# Patient Record
Sex: Female | Born: 1955 | Race: White | Hispanic: No | Marital: Married | Smoking: Never smoker
Health system: Southern US, Community
[De-identification: ages and names within clinical notes are randomized; demographics above are authoritative.]

## PROBLEM LIST (undated history)

## (undated) DIAGNOSIS — C8590 Non-Hodgkin lymphoma, unspecified, unspecified site: Secondary | ICD-10-CM

## (undated) DIAGNOSIS — J45909 Unspecified asthma, uncomplicated: Secondary | ICD-10-CM

## (undated) DIAGNOSIS — K219 Gastro-esophageal reflux disease without esophagitis: Secondary | ICD-10-CM

## (undated) DIAGNOSIS — Z8489 Family history of other specified conditions: Secondary | ICD-10-CM

## (undated) DIAGNOSIS — Z789 Other specified health status: Secondary | ICD-10-CM

## (undated) HISTORY — PX: PORT A CATH INJECTION (ARMC HX): HXRAD1731

## (undated) HISTORY — PX: ROTATOR CUFF REPAIR: SHX139

## (undated) HISTORY — DX: Other specified health status: Z78.9

## (undated) HISTORY — PX: ABDOMINAL HYSTERECTOMY: SHX81

## (undated) HISTORY — DX: Unspecified asthma, uncomplicated: J45.909

## (undated) HISTORY — PX: BLADDER SURGERY: SHX569

---
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
## 2010-01-20 ENCOUNTER — Ambulatory Visit: Payer: Self-pay | Admitting: Unknown Physician Specialty

## 2011-10-30 LAB — HM PAP SMEAR: HM Pap smear: NORMAL

## 2011-10-30 LAB — HM MAMMOGRAPHY: HM Mammogram: NORMAL

## 2012-05-15 ENCOUNTER — Ambulatory Visit: Payer: Self-pay | Admitting: Obstetrics and Gynecology

## 2013-01-25 ENCOUNTER — Emergency Department: Payer: Self-pay | Admitting: Emergency Medicine

## 2013-01-25 LAB — COMPREHENSIVE METABOLIC PANEL
Bilirubin,Total: 0.6 mg/dL (ref 0.2–1.0)
Co2: 25 mmol/L (ref 21–32)
EGFR (African American): >60
Glucose: 95 mg/dL (ref 65–99)
Osmolality: 275 (ref 275–301)
Potassium: 3.9 mmol/L (ref 3.5–5.1)
SGOT(AST): 21 U/L (ref 15–37)
Sodium: 137 mmol/L (ref 136–145)
Total Protein: 7.9 g/dL (ref 6.4–8.2)

## 2013-01-25 LAB — CBC
HCT: 39.7 % (ref 35.0–47.0)
HGB: 13.6 g/dL (ref 12.0–16.0)
MCH: 29.6 pg (ref 26.0–34.0)
MCHC: 34.2 g/dL (ref 32.0–36.0)
Platelet: 285 10*3/uL (ref 150–440)
RDW: 13.8 % (ref 11.5–14.5)
WBC: 8.8 10*3/uL (ref 3.6–11.0)

## 2013-01-25 LAB — TROPONIN I
Troponin-I: <0.02 ng/mL
Troponin-I: <0.02 ng/mL

## 2014-02-25 DIAGNOSIS — IMO0002 Reserved for concepts with insufficient information to code with codable children: Secondary | ICD-10-CM | POA: Insufficient documentation

## 2014-04-20 DIAGNOSIS — M67919 Unspecified disorder of synovium and tendon, unspecified shoulder: Secondary | ICD-10-CM | POA: Insufficient documentation

## 2015-04-06 ENCOUNTER — Ambulatory Visit (INDEPENDENT_AMBULATORY_CARE_PROVIDER_SITE_OTHER): Payer: 59 | Admitting: Family Medicine

## 2015-04-06 ENCOUNTER — Encounter: Payer: Self-pay | Admitting: Family Medicine

## 2015-04-06 VITALS — BP 116/77 | HR 80 | Temp 98.1°F | Resp 16 | Ht 66.0 in | Wt 197.6 lb

## 2015-04-06 DIAGNOSIS — J4520 Mild intermittent asthma, uncomplicated: Secondary | ICD-10-CM

## 2015-04-06 DIAGNOSIS — J45909 Unspecified asthma, uncomplicated: Secondary | ICD-10-CM | POA: Insufficient documentation

## 2015-04-06 MED ORDER — ALBUTEROL SULFATE HFA 108 (90 BASE) MCG/ACT IN AERS: 2.0000 | INHALATION_SPRAY | Freq: Four times a day (QID) | RESPIRATORY_TRACT | Status: DC | PRN

## 2015-04-06 NOTE — Patient Instructions (Signed)
Please use inhaler as needed.    Please seek immediate medical attention if you develop shortness of breath not relieve by inhaler, chest pain/tightness, fever > 103 F or other concerning symptoms.

## 2015-04-06 NOTE — Progress Notes (Signed)
Subjective:    Patient ID: Andrea Lloyd, female    DOB: August 12, 1956, 59 y.o.   MRN: [id number]  HPI: Andrea Lloyd is a 59 y.o. female presenting on 04/06/2015 for Follow-up   HPI  Pt presents for refills of inhalers. Pt has airway hyperactivity and uses inhalers sparingly.She had asthma as a child and uses inhalers as an adult when she has a cold. Last visit was in 2013. Pt uses inhaler once daily  roughly every 3-4 days "when she feels rattle in the back of her throat."  Last inhaler has lasted her approximately 12 mos.  Denies nighttime awakening with shortness of breath or wheezing. Denies chest tightness of shortness of breath. Has never had spirometry.   Offered patient health maintenance labs (lipid panel and blood glucose check) and mammogram.  Pt has declined at this time.   Past Medical History  Diagnosis Date  . Asthma   . Gravida 4 para 4     No current outpatient prescriptions on file prior to visit.   No current facility-administered medications on file prior to visit.    Review of Systems  Constitutional: Negative for fever and chills.  HENT: Negative.   Respiratory: Negative for cough, chest tightness and wheezing.   Cardiovascular: Negative for chest pain and leg swelling.  Gastrointestinal: Negative for nausea, vomiting, abdominal pain, diarrhea and constipation.  Endocrine: Negative.  Negative for cold intolerance, heat intolerance, polydipsia, polyphagia and polyuria.  Genitourinary: Negative for dysuria and difficulty urinating.  Musculoskeletal: Negative.   Neurological: Negative for dizziness, light-headedness and numbness.  Psychiatric/Behavioral: Negative.    Per HPI unless specifically indicated above     Objective:    BP 116/77 mmHg  Pulse 80  Temp(Src) 98.1 F (36.7 C) (Oral)  Resp 16  Ht 5\' 6"  (1.676 m)  Wt 197 lb 9.6 oz (89.631 kg)  BMI 31.91 kg/m2  Wt Readings from Last 3 Encounters:  04/06/15 197 lb 9.6 oz (89.631 kg)   07/12/12 200 lb 9.6 oz (90.992 kg)    Physical Exam  Constitutional: She is oriented to person, place, and time. She appears well-developed and well-nourished.  HENT:  Head: Normocephalic and atraumatic.  Neck: Neck supple. Carotid bruit is not present.  Cardiovascular: Normal rate, regular rhythm and normal heart sounds.  Exam reveals no gallop and no friction rub.   No murmur heard. Pulmonary/Chest: Effort normal and breath sounds normal. No respiratory distress. She has no wheezes. She exhibits no tenderness.  Abdominal: Normal appearance.  Lymphadenopathy:    She has no cervical adenopathy.  Neurological: She is alert and oriented to person, place, and time.  Skin: Skin is warm and dry.   Results for orders placed or performed in visit on 04/06/15  HM MAMMOGRAPHY  Result Value Ref Range   HM Mammogram normal   HM PAP SMEAR  Result Value Ref Range   HM Pap smear normal       Assessment & Plan:   Problem List Items Addressed This Visit      Respiratory   Airway hyperreactivity - Primary    Renewed inhaler today. Continued use as needed.  Pt has declined spirometry or peak flow today. Follow-up 1 year.   Pt declined health maintenance labwork.       Relevant Medications   albuterol (PROVENTIL HFA;VENTOLIN HFA) 108 (90 BASE) MCG/ACT inhaler      Meds ordered this encounter  Medications  . DISCONTD: albuterol (PROVENTIL HFA;VENTOLIN HFA) 108 (90 BASE) MCG/ACT  inhaler    Sig: Inhale 2 puffs into the lungs every 6 (six) hours as needed for wheezing or shortness of breath.  Marland Kitchen albuterol (PROVENTIL HFA;VENTOLIN HFA) 108 (90 BASE) MCG/ACT inhaler    Sig: Inhale 2 puffs into the lungs every 6 (six) hours as needed for wheezing or shortness of breath.    Dispense:  1 Inhaler    Refill:  11    Order Specific Question:  Supervising Provider    Answer:  Arlis Porta [phone]      Follow up plan: Return in about 1 year (around 04/05/2016), or if symptoms worsen or  fail to improve.

## 2015-04-06 NOTE — Assessment & Plan Note (Addendum)
Renewed inhaler today. Continued use as needed.  Pt has declined spirometry or peak flow today. Follow-up 1 year.   Pt declined health maintenance labwork.

## 2015-06-02 DIAGNOSIS — J45901 Unspecified asthma with (acute) exacerbation: Secondary | ICD-10-CM | POA: Insufficient documentation

## 2016-12-31 ENCOUNTER — Ambulatory Visit
Admission: RE | Admit: 2016-12-31 | Discharge: 2016-12-31 | Disposition: A | Discharge status: Home / Self Care | Payer: BLUE CROSS/BLUE SHIELD | Source: Ambulatory Visit | Attending: Gastroenterology | Admitting: Gastroenterology

## 2016-12-31 ENCOUNTER — Other Ambulatory Visit: Payer: Self-pay | Admitting: Gastroenterology

## 2016-12-31 DIAGNOSIS — R590 Localized enlarged lymph nodes: Secondary | ICD-10-CM | POA: Diagnosis not present

## 2016-12-31 DIAGNOSIS — R1032 Left lower quadrant pain: Secondary | ICD-10-CM | POA: Insufficient documentation

## 2016-12-31 MED ORDER — IOPAMIDOL (ISOVUE-300) INJECTION 61%
100.0000 mL | Freq: Once | INTRAVENOUS | Status: AC | PRN
Start: 2016-12-31 — End: 2016-12-31
  Administered 2016-12-31: 100 mL via INTRAVENOUS

## 2017-01-02 ENCOUNTER — Other Ambulatory Visit: Payer: Self-pay | Admitting: Gastroenterology

## 2017-01-02 DIAGNOSIS — R1013 Epigastric pain: Secondary | ICD-10-CM

## 2017-01-02 DIAGNOSIS — R591 Generalized enlarged lymph nodes: Secondary | ICD-10-CM

## 2017-01-02 DIAGNOSIS — R599 Enlarged lymph nodes, unspecified: Secondary | ICD-10-CM

## 2017-01-03 ENCOUNTER — Other Ambulatory Visit: Payer: Self-pay | Admitting: *Deleted

## 2017-01-04 ENCOUNTER — Ambulatory Visit
Admission: RE | Admit: 2017-01-04 | Discharge: 2017-01-04 | Disposition: A | Discharge status: Home / Self Care | Payer: BLUE CROSS/BLUE SHIELD | Source: Ambulatory Visit | Attending: Gastroenterology | Admitting: Gastroenterology

## 2017-01-04 DIAGNOSIS — K8020 Calculus of gallbladder without cholecystitis without obstruction: Secondary | ICD-10-CM | POA: Diagnosis not present

## 2017-01-04 DIAGNOSIS — R1013 Epigastric pain: Secondary | ICD-10-CM | POA: Insufficient documentation

## 2017-01-08 ENCOUNTER — Other Ambulatory Visit: Payer: Self-pay | Admitting: *Deleted

## 2017-01-08 ENCOUNTER — Ambulatory Visit
Admission: RE | Admit: 2017-01-08 | Discharge: 2017-01-08 | Disposition: A | Discharge status: Home / Self Care | Payer: BLUE CROSS/BLUE SHIELD | Source: Ambulatory Visit | Attending: Gastroenterology | Admitting: Gastroenterology

## 2017-01-08 DIAGNOSIS — R599 Enlarged lymph nodes, unspecified: Secondary | ICD-10-CM

## 2017-01-08 DIAGNOSIS — R591 Generalized enlarged lymph nodes: Secondary | ICD-10-CM

## 2017-01-08 DIAGNOSIS — R590 Localized enlarged lymph nodes: Secondary | ICD-10-CM | POA: Diagnosis present

## 2017-01-08 LAB — GLUCOSE, CAPILLARY: Glucose-Capillary: 91 mg/dL (ref 65–99)

## 2017-01-08 MED ORDER — FLUDEOXYGLUCOSE F - 18 (FDG) INJECTION
12.0000 | Freq: Once | INTRAVENOUS | Status: AC | PRN
  Administered 2017-01-08: 12.196 via INTRAVENOUS

## 2017-01-11 ENCOUNTER — Encounter: Payer: Self-pay | Admitting: Oncology

## 2017-01-11 ENCOUNTER — Inpatient Hospital Stay: Payer: BLUE CROSS/BLUE SHIELD | Attending: Oncology | Admitting: Oncology

## 2017-01-11 VITALS — BP 119/83 | HR 76 | Temp 98.9°F | Resp 18 | Ht 67.0 in | Wt 194.7 lb

## 2017-01-11 DIAGNOSIS — Z90710 Acquired absence of both cervix and uterus: Secondary | ICD-10-CM | POA: Diagnosis not present

## 2017-01-11 DIAGNOSIS — K8020 Calculus of gallbladder without cholecystitis without obstruction: Secondary | ICD-10-CM | POA: Insufficient documentation

## 2017-01-11 DIAGNOSIS — R935 Abnormal findings on diagnostic imaging of other abdominal regions, including retroperitoneum: Secondary | ICD-10-CM | POA: Insufficient documentation

## 2017-01-11 DIAGNOSIS — C8203 Follicular lymphoma grade I, intra-abdominal lymph nodes: Secondary | ICD-10-CM | POA: Insufficient documentation

## 2017-01-11 DIAGNOSIS — J45909 Unspecified asthma, uncomplicated: Secondary | ICD-10-CM | POA: Diagnosis not present

## 2017-01-11 DIAGNOSIS — R590 Localized enlarged lymph nodes: Secondary | ICD-10-CM | POA: Insufficient documentation

## 2017-01-11 NOTE — Progress Notes (Signed)
Patient here today as new evaluation regarding abdominal lymphadenopathy.  Referred by Dr. Jacqulyn Liner.

## 2017-01-11 NOTE — Progress Notes (Signed)
Hematology/Oncology Consult note [facility] [phone] [phone]  Patient Care Team: Sherrin Daisy, MD as PCP - General (Family Medicine)   Name of the patient: Andrea Lloyd  [id number]  1956-04-27    Reason for referral- intra abdominal adenopathy   Referring physician- NP Donnelly Angelica  Date of visit: 01/11/17   History of presenting illness- patient is a 61 year old female who was seen by [facility] GI for evaluation of left lower quadrant abdominal pain which has been ongoing for the last few months She has a history of hysterectomy/sacral colpopexy/ vaginal mesh in 2014.   2. This was followed by a CT of the abdomen which showed: Multiple enlarged abdominal and left common iliac nodes. Findings are worrisome for either metastatic adenopathy from a abdominal primary versus lymphoproliferative disorder. Consider further investigation with PET-CT and tissue sampling.  3. PET/CT scan from 01/08/2017 showed:Hypermetabolic upper abdominal and retroperitoneal lymph nodes, measuring up to 1.9 cm short axis, worrisome for nodal metastases versus lymphoma.  Hypermetabolic 7 mm short axis node at the left thoracic inlet, worrisome for lymphatic spread via the thoracic duct.  4. Tumor markers including CEA and CA-19-9 was normal at 1 and 8 respectively.  5. She is otherwise doing well and denies any complaints of unintentional weight loss, loss of appetite, fevers or chills or drenching night sweats. Her abdominal pain is mostly associated with eating and sometimes comes on a day later. It is mainly in her left lower quadrant and is a dull aching pain but sometimes she seems to have pain in the epigastrium and right upper quadrant   ECOG PS- 0  Pain scale- 2   Review of systems- Review of Systems  Constitutional: Negative for chills, fever, malaise/fatigue and weight loss.  HENT: Negative for congestion, ear discharge  and nosebleeds.   Eyes: Negative for blurred vision.  Respiratory: Negative for cough, hemoptysis, sputum production, shortness of breath and wheezing.   Cardiovascular: Negative for chest pain, palpitations, orthopnea and claudication.  Gastrointestinal: Negative for abdominal pain, blood in stool, constipation, diarrhea, heartburn, melena, nausea and vomiting.  Genitourinary: Negative for dysuria, flank pain, frequency, hematuria and urgency.  Musculoskeletal: Negative for back pain, joint pain and myalgias.  Skin: Negative for rash.  Neurological: Negative for dizziness, tingling, focal weakness, seizures, weakness and headaches.  Endo/Heme/Allergies: Does not bruise/bleed easily.  Psychiatric/Behavioral: Negative for depression and suicidal ideas. The patient does not have insomnia.     No Known Allergies  Patient Active Problem List   Diagnosis Date Noted  . Airway hyperreactivity 04/06/2015  . 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 Asthma   . Gravida 4 para 4      Past Surgical History:  Procedure Laterality Date  . ABDOMINAL HYSTERECTOMY     06 01 2013  . TONSILLECTOMY  1972    Social History   Social History  . Marital status: Married    Spouse name: N/A  . Number of children: N/A  . Years of education: N/A   Occupational History  . Not on file.   Social History Main Topics  . Smoking status: Never Smoker  . Smokeless tobacco: Never Used  . Alcohol use No  . Drug use: No  . Sexual activity: Not Currently   Other Topics Concern  . Not on file   Social History Narrative  . No narrative on file     Family History  Problem  Relation Age of Onset  . Alcohol abuse Maternal Grandfather   . Hyperlipidemia Neg Hx   . Heart disease Neg Hx   . Diabetes Neg Hx   . Healthy Mother   . Healthy Father   . Healthy Sister   . Healthy Brother      Current Outpatient Prescriptions:  .  albuterol (PROVENTIL  HFA;VENTOLIN HFA) 108 (90 BASE) MCG/ACT inhaler, Inhale 2 puffs into the lungs every 6 (six) hours as needed for wheezing or shortness of breath., Disp: 1 Inhaler, Rfl: 11   Physical exam:  Vitals:   01/11/17 1459  BP: 119/83  Pulse: 76  Resp: 18  Temp: 98.9 F (37.2 C)  TempSrc: Tympanic  Weight: 194 lb 10.7 oz (88.3 kg)  Height: 5\' 7"  (1.702 m)   Physical Exam  Constitutional: She is oriented to person, place, and time and well-developed, well-nourished, and in no distress.  HENT:  Head: Normocephalic and atraumatic.  Eyes: EOM are normal. Pupils are equal, round, and reactive to light.  Neck: Normal range of motion.  Cardiovascular: Normal rate, regular rhythm and normal heart sounds.   Pulmonary/Chest: Effort normal and breath sounds normal.  Abdominal: Soft. Bowel sounds are normal.  TTP in RUQ  Neurological: She is alert and oriented to person, place, and time.  Skin: Skin is warm and dry.       CMP Latest Ref Rng & Units 01/25/2013  Glucose 65 - 99 mg/dL 95  BUN 7 - 18 mg/dL 16  Creatinine 0.60 - 1.30 mg/dL 0.83  Sodium 136 - 145 mmol/L 137  Potassium 3.5 - 5.1 mmol/L 3.9  Chloride 98 - 107 mmol/L 104  CO2 21 - 32 mmol/L 25  Calcium 8.5 - 10.1 mg/dL 9.1  Total Protein 6.4 - 8.2 g/dL 7.9  Total Bilirubin 0.2 - 1.0 mg/dL 0.6  Alkaline Phos 50 - 136 Unit/L 92  AST 15 - 37 Unit/L 21  ALT 12 - 78 U/L 40   CBC Latest Ref Rng & Units 01/25/2013  WBC 3.6 - 11.0 x10 3/mm 3 8.8  Hemoglobin 12.0 - 16.0 g/dL 13.6  Hematocrit 35.0 - 47.0 % 39.7  Platelets 150 - 440 x10 3/mm 3 285    No images are attached to the encounter.  Ct Abdomen Pelvis W Contrast  Result Date: 12/31/2016 CLINICAL DATA:  Left lower quadrant abdominal pain EXAM: CT ABDOMEN AND PELVIS WITH CONTRAST TECHNIQUE: Multidetector CT imaging of the abdomen and pelvis was performed using the standard protocol following bolus administration of intravenous contrast. CONTRAST:  148mL ISOVUE-300 IOPAMIDOL  (ISOVUE-300) INJECTION 61% COMPARISON:  None. FINDINGS: Lower chest: No acute abnormality. Hepatobiliary: No focal liver abnormality is seen. No gallstones, gallbladder wall thickening, or biliary dilatation. Pancreas: Unremarkable. No pancreatic ductal dilatation or surrounding inflammatory changes. Spleen: Normal in size without focal abnormality. Adrenals/Urinary Tract: The adrenal glands are normal. Unremarkable appearance of the kidneys. Urinary bladder appears normal. Stomach/Bowel: The stomach appears normal. The small bowel loops have a normal course and caliber. The appendix is visualized and appears normal. The colon is unremarkable. No abnormal bowel wall thickening or inflammation. Vascular/Lymphatic: Normal appearance of the abdominal aorta. Multiple enlarged upper abdominal retroperitoneal lymph nodes are identified. Index node in the aortocaval region measures 2.1 cm, image 33 of series 2. Index periaortic lymph node measures 1.6 cm, image 34 of series 2. Left common iliac node measures 1.4 cm, image 46 of series 2. No inguinal adenopathy. Reproductive: Status post hysterectomy. No adnexal masses. Other: No  abdominal wall hernia or abnormality. No abdominopelvic ascites. Musculoskeletal: No acute or significant osseous findings. IMPRESSION: 1. Multiple enlarged abdominal and left common iliac nodes. Findings are worrisome for either metastatic adenopathy from a abdominal primary versus lymphoproliferative disorder. Consider further investigation with PET-CT and tissue sampling. Electronically Signed   By: Kerby Moors M.D.   On: 12/31/2016 14:09   Nm Pet Image Restage (ps) Whole Body  Result Date: 01/08/2017 CLINICAL DATA:  Initial treatment strategy for abdominal lymphadenopathy. EXAM: NUCLEAR MEDICINE PET WHOLE BODY TECHNIQUE: 12.2 mCi F-18 FDG was injected intravenously. Full-ring PET imaging was performed from the vertex to the feet after the radiotracer. CT data was obtained and used for  attenuation correction and anatomic localization. FASTING BLOOD GLUCOSE:  Value: 91 mg/dl COMPARISON:  CT abdomen/pelvis dated 12/31/2016 FINDINGS: HEAD/NECK No hypermetabolic activity in the scalp. No hypermetabolic cervical lymph nodes. CHEST No suspicious pulmonary nodules on the CT scan. 7 mm short axis node at the left thoracic inlet (series 3/ image 94), max SUV 4.1. No hypermetabolic hilar nodes. ABDOMEN/PELVIS No abnormal hypermetabolic activity within the liver, pancreas, adrenal glands, or spleen. Status post hysterectomy. Hypermetabolic upper abdominal and retroperitoneal lymph nodes, including: --8 mm short axis node along the right hepatic renal fossa (series 3/ image 165), max SUV 8.3 --19 mm short axis right para-aortic node (series 3/ image 187), max SUV 7.3 --13 mm short axis left para-aortic node (series 3/image 196), max SUV 7.0 --13 mm short axis left para-aortic node (series 3/image 205), max SUV 7.3 --12 mm short axis left para-aortic node (series 3/ image 206), max SUV 8.3 SKELETON No focal hypermetabolic activity to suggest skeletal metastasis. EXTREMITIES No abnormal hypermetabolic activity in the lower extremities. IMPRESSION: Hypermetabolic upper abdominal and retroperitoneal lymph nodes, measuring up to 1.9 cm short axis, worrisome for nodal metastases versus lymphoma. Hypermetabolic 7 mm short axis node at the left thoracic inlet, worrisome for lymphatic spread via the thoracic duct. Otherwise, no findings suspicious for primary malignancy. Electronically Signed   By: Julian Hy M.D.   On: 01/08/2017 10:20   US Abdomen Limited Ruq  Result Date: 01/04/2017 CLINICAL DATA:  Abdominal pain for several months EXAM: US ABDOMEN LIMITED - RIGHT UPPER QUADRANT COMPARISON:  12/31/2016 FINDINGS: Gallbladder: Dilated gallbladder similar to that seen on prior CT examination. Multiple dependent densities are noted within consistent with cholelithiasis. No gallbladder wall thickening or  pericholecystic fluid is noted. Common bile duct: Diameter: 7 mm. This is mildly prominent for the patient's given age but similar to that seen on recent CT examination. Liver: No focal lesion identified. Within normal limits in parenchymal echogenicity. IMPRESSION: Cholelithiasis without complicating factors. Mild prominence of the common bile duct of uncertain significance. Correlation with laboratory values is recommended. Electronically Signed   By: Inez Catalina M.D.   On: 01/04/2017 10:08    Assessment and plan- Patient is a 61 y.o. female who was been referred to [place] for intra-abdominal adenopathy   Recent CBC and CMP from 12/31/2016 was within normal limits. We will proceed with a CT-guided biopsy of one of the retroperitoneal lymph nodes. I discussed her case at the tumor board and reviewed her images personally with Dr. Kathlene Cote from IR. They do feel that needle guided biopsy of one of the lymph nodes is feasible. I will see the patient back in about 2 weeks' time after the results of her biopsy are back  Thank you for this kind referral and the opportunity to participate in the care of  this patient   Visit Diagnosis 1. Retroperitoneal lymphadenopathy     Dr. Randa Evens, MD, MPH [facility] Pager- [phone] 01/11/2017 3:44 PM

## 2017-01-18 ENCOUNTER — Other Ambulatory Visit: Payer: Self-pay | Admitting: Radiology

## 2017-01-18 ENCOUNTER — Other Ambulatory Visit: Payer: Self-pay | Admitting: Physician Assistant

## 2017-01-18 ENCOUNTER — Other Ambulatory Visit: Payer: Self-pay | Admitting: General Surgery

## 2017-01-21 ENCOUNTER — Ambulatory Visit
Admission: RE | Admit: 2017-01-21 | Discharge: 2017-01-21 | Disposition: A | Discharge status: Home / Self Care | Payer: BLUE CROSS/BLUE SHIELD | Source: Ambulatory Visit | Attending: Oncology | Admitting: Oncology

## 2017-01-21 ENCOUNTER — Other Ambulatory Visit (HOSPITAL_COMMUNITY): Payer: Self-pay | Admitting: Interventional Radiology

## 2017-01-21 DIAGNOSIS — R590 Localized enlarged lymph nodes: Secondary | ICD-10-CM | POA: Diagnosis present

## 2017-01-21 DIAGNOSIS — C8203 Follicular lymphoma grade I, intra-abdominal lymph nodes: Secondary | ICD-10-CM | POA: Insufficient documentation

## 2017-01-21 HISTORY — DX: Gastro-esophageal reflux disease without esophagitis: K21.9

## 2017-01-21 LAB — CBC WITH DIFFERENTIAL/PLATELET
Basophils Absolute: 0 10*3/uL (ref 0–0.1)
Basophils Relative: 1 %
Eosinophils Absolute: 0.1 10*3/uL (ref 0–0.7)
Eosinophils Relative: 2 %
HEMATOCRIT: 40.9 % (ref 35.0–47.0)
Hemoglobin: 13.7 g/dL (ref 12.0–16.0)
LYMPHS PCT: 25 %
Lymphs Abs: 1.8 10*3/uL (ref 1.0–3.6)
MCH: 28.7 pg (ref 26.0–34.0)
MCHC: 33.4 g/dL (ref 32.0–36.0)
MCV: 85.8 fL (ref 80.0–100.0)
MONO ABS: 0.4 10*3/uL (ref 0.2–0.9)
MONOS PCT: 5 %
NEUTROS ABS: 4.7 10*3/uL (ref 1.4–6.5)
NEUTROS PCT: 67 %
Platelets: 268 10*3/uL (ref 150–440)
RBC: 4.77 MIL/uL (ref 3.80–5.20)
RDW: 13.8 % (ref 11.5–14.5)
WBC: 7 10*3/uL (ref 3.6–11.0)

## 2017-01-21 LAB — PROTIME-INR
INR: 0.98
Prothrombin Time: 13 seconds (ref 11.4–15.2)

## 2017-01-21 LAB — APTT: aPTT: 30 seconds (ref 24–36)

## 2017-01-21 MED ORDER — FENTANYL CITRATE (PF) 100 MCG/2ML IJ SOLN
INTRAMUSCULAR | Status: AC | PRN
  Administered 2017-01-21: 50 ug via INTRAVENOUS

## 2017-01-21 MED ORDER — MIDAZOLAM HCL 5 MG/5ML IJ SOLN
INTRAMUSCULAR | Status: AC | PRN
  Administered 2017-01-21 (×2): 1 mg via INTRAVENOUS

## 2017-01-21 MED ORDER — SODIUM CHLORIDE 0.9 % IV SOLN: INTRAVENOUS | Status: DC

## 2017-01-21 NOTE — Procedures (Signed)
CT core bx L paraaortic LAN 18g x6 to surg path No complication No blood loss. See complete dictation in [facility] PACS.  

## 2017-01-23 ENCOUNTER — Other Ambulatory Visit: Payer: Self-pay | Admitting: Pathology

## 2017-01-23 LAB — SURGICAL PATHOLOGY

## 2017-01-23 LAB — COMP PANEL: LEUKEMIA/LYMPHOMA

## 2017-01-24 ENCOUNTER — Encounter: Payer: Self-pay | Admitting: Oncology

## 2017-01-25 ENCOUNTER — Inpatient Hospital Stay (HOSPITAL_BASED_OUTPATIENT_CLINIC_OR_DEPARTMENT_OTHER): Payer: BLUE CROSS/BLUE SHIELD | Admitting: Oncology

## 2017-01-25 ENCOUNTER — Encounter: Payer: Self-pay | Admitting: Interventional Radiology

## 2017-01-25 VITALS — BP 114/78 | HR 77 | Temp 94.5°F | Resp 16 | Wt 194.5 lb

## 2017-01-25 DIAGNOSIS — C8203 Follicular lymphoma grade I, intra-abdominal lymph nodes: Secondary | ICD-10-CM

## 2017-01-25 DIAGNOSIS — R935 Abnormal findings on diagnostic imaging of other abdominal regions, including retroperitoneum: Secondary | ICD-10-CM

## 2017-01-25 NOTE — Progress Notes (Signed)
Patient here today for follow up.   

## 2017-01-28 ENCOUNTER — Encounter: Payer: Self-pay | Admitting: Oncology

## 2017-01-28 DIAGNOSIS — C8203 Follicular lymphoma grade I, intra-abdominal lymph nodes: Secondary | ICD-10-CM | POA: Insufficient documentation

## 2017-01-28 NOTE — Progress Notes (Signed)
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   2. This was followed by a CT of the abdomen which showed: Multiple enlarged abdominal and left common iliac nodes. Findings are worrisome for either metastatic adenopathy from a abdominal primary versus lymphoproliferative disorder. Consider further investigation with PET-CT and tissue sampling.  3. PET/CT scan from 01/08/2017 showed:Hypermetabolic upper abdominal and retroperitoneal lymph nodes, measuring up to 1.9 cm short axis, worrisome for nodal metastases versus lymphoma.  Hypermetabolic 7 mm short axis node at the left thoracic inlet, worrisome for lymphatic spread via the thoracic duct.  4. Tumor markers including CEA and CA-19-9 was normal at 1 and 8 respectively.  5. She is otherwise doing well and denies any complaints of unintentional weight loss, loss of appetite, fevers or chills or drenching night sweats. Her abdominal pain is mostly associated with eating and sometimes comes on a day later. It is mainly in her left lower quadrant and is a dull aching pain but sometimes she seems to have pain in the epigastrium and right upper quadrant  6. Patient underwent IR guided retroperitoneal LN biopsy which showed : DIAGNOSIS:  A. LYMPH NODE, LEFT RETROPERITONEAL; CT-GUIDED BIOPSY:  - FOLLICULAR  LYMPHOMA, GRADE 1-2.   Comment:  A limited panel of immunohistochemical stains was performed and the  neoplastic cells demonstrate the following immunoreactivity:  CD20: Positive  CD3: Negative  CD5: Negative  CD10: Positive  BCL-2: Positive  BCL-6: Positive  Flow cytometry was performed by [facility] which demonstrated a monoclonal CD10 positive B cell  population with Kappa light chain restriction. Overall the findings are  consistent with follicular lymphoma, grade 1-2. Stain controls worked  appropriately.  These findings were communicated to Dr. Janese Banks on 01/23/2017.     Interval history- Overall patient is doing well. Still has vague on and off abdominal pain which is infrequent and most associated with eating.   ECOG PS- 1 Pain scale- 0 Opioid associated constipation- no  Review of systems- Review of Systems  Constitutional: Negative for chills, fever, malaise/fatigue and weight loss.  HENT: Negative for congestion, ear discharge and nosebleeds.   Eyes: Negative for blurred vision.  Respiratory: Negative for cough, hemoptysis, sputum production, shortness of breath and wheezing.   Cardiovascular: Negative for chest pain, palpitations, orthopnea and claudication.  Gastrointestinal: Positive for abdominal pain. Negative for blood in stool, constipation, diarrhea, heartburn, melena, nausea and vomiting.  Genitourinary: Negative for dysuria, flank pain, frequency, hematuria and urgency.  Musculoskeletal: Negative for back pain, joint pain and myalgias.  Skin: Negative for rash.  Neurological: Negative for dizziness, tingling, focal weakness, seizures, weakness and headaches.  Endo/Heme/Allergies: Does not bruise/bleed easily.  Psychiatric/Behavioral: Negative for depression and suicidal ideas. The patient does not have insomnia.        No Known Allergies   Past Medical History:  Diagnosis Date  . Asthma   .  GERD (gastroesophageal  reflux disease)   . Gravida 4 para 4      Past Surgical History:  Procedure Laterality Date  . ABDOMINAL HYSTERECTOMY     06 01 2013  . BLADDER SURGERY    . ROTATOR CUFF REPAIR Right   . TONSILLECTOMY  1972    Social History   Social History  . Marital status: Married    Spouse name: N/A  . Number of children: N/A  . Years of education: N/A   Occupational History  . Not on file.   Social History Main Topics  . Smoking status: Never Smoker  . Smokeless tobacco: Never Used  . Alcohol use No  . Drug use: No  . Sexual activity: Not Currently   Other Topics Concern  . Not on file   Social History Narrative  . No narrative on file    Family History  Problem Relation Age of Onset  . Alcohol abuse Maternal Grandfather   . Healthy Mother   . Breast cancer Mother   . Cancer Father   . Healthy Sister   . Healthy Brother   . Breast cancer Maternal Aunt   . Hyperlipidemia Neg Hx   . Heart disease Neg Hx   . Diabetes Neg Hx      Current Outpatient Prescriptions:  .  albuterol (PROAIR HFA) 108 (90 Base) MCG/ACT inhaler, Inhale into the lungs., Disp: , Rfl:  .  Cholecalciferol (VITAMIN D3 PO), Take 20,000 mcg by mouth daily., Disp: , Rfl:  .  Menaquinone-7 (VITAMIN K2 PO), Take 200 mcg by mouth daily., Disp: , Rfl:  .  Multiple Vitamin (MULTI-VITAMIN DAILY PO), Take by mouth., Disp: , Rfl:   Physical exam:  Vitals:   01/25/17 1357  BP: 114/78  Pulse: 77  Resp: 16  Temp: (!) 94.5 F (34.7 C)  TempSrc: Tympanic  Weight: 194 lb 8 oz (88.2 kg)   Physical Exam  Constitutional: She is oriented to person, place, and time and well-developed, well-nourished, and in no distress.  HENT:  Head: Normocephalic and atraumatic.  Eyes: EOM are normal. Pupils are equal, round, and reactive to light.  Neck: Normal range of motion.  Cardiovascular: Normal rate, regular rhythm and normal heart sounds.   Pulmonary/Chest: Effort normal and breath sounds normal.  Abdominal:  Soft. Bowel sounds are normal.  Neurological: She is alert and oriented to person, place, and time.  Skin: Skin is warm and dry.     CMP Latest Ref Rng & Units 01/25/2013  Glucose 65 - 99 mg/dL 95  BUN 7 - 18 mg/dL 16  Creatinine 0.60 - 1.30 mg/dL 0.83  Sodium 136 - 145 mmol/L 137  Potassium 3.5 - 5.1 mmol/L 3.9  Chloride 98 - 107 mmol/L 104  CO2 21 - 32 mmol/L 25  Calcium 8.5 - 10.1 mg/dL 9.1  Total Protein 6.4 - 8.2 g/dL 7.9  Total Bilirubin 0.2 - 1.0 mg/dL 0.6  Alkaline Phos 50 - 136 Unit/L 92  AST 15 - 37 Unit/L 21  ALT 12 - 78 U/L 40   CBC Latest Ref Rng & Units 01/21/2017  WBC 3.6 - 11.0 K/uL 7.0  Hemoglobin 12.0 - 16.0 g/dL 13.7  Hematocrit 35.0 - 47.0 % 40.9  Platelets 150 - 440 K/uL 268    No images are attached to the encounter.  Ct Abdomen Pelvis W Contrast  Result Date: 12/31/2016 CLINICAL DATA:  Left lower quadrant abdominal pain EXAM: CT ABDOMEN AND PELVIS WITH CONTRAST TECHNIQUE: Multidetector CT  imaging of the abdomen and pelvis was performed using the standard protocol following bolus administration of intravenous contrast. CONTRAST:  136m ISOVUE-300 IOPAMIDOL (ISOVUE-300) INJECTION 61% COMPARISON:  None. FINDINGS: Lower chest: No acute abnormality. Hepatobiliary: No focal liver abnormality is seen. No gallstones, gallbladder wall thickening, or biliary dilatation. Pancreas: Unremarkable. No pancreatic ductal dilatation or surrounding inflammatory changes. Spleen: Normal in size without focal abnormality. Adrenals/Urinary Tract: The adrenal glands are normal. Unremarkable appearance of the kidneys. Urinary bladder appears normal. Stomach/Bowel: The stomach appears normal. The small bowel loops have a normal course and caliber. The appendix is visualized and appears normal. The colon is unremarkable. No abnormal bowel wall thickening or inflammation. Vascular/Lymphatic: Normal appearance of the abdominal aorta. Multiple enlarged upper abdominal retroperitoneal lymph nodes  are identified. Index node in the aortocaval region measures 2.1 cm, image 33 of series 2. Index periaortic lymph node measures 1.6 cm, image 34 of series 2. Left common iliac node measures 1.4 cm, image 46 of series 2. No inguinal adenopathy. Reproductive: Status post hysterectomy. No adnexal masses. Other: No abdominal wall hernia or abnormality. No abdominopelvic ascites. Musculoskeletal: No acute or significant osseous findings. IMPRESSION: 1. Multiple enlarged abdominal and left common iliac nodes. Findings are worrisome for either metastatic adenopathy from a abdominal primary versus lymphoproliferative disorder. Consider further investigation with PET-CT and tissue sampling. Electronically Signed   By: TKerby MoorsM.D.   On: 12/31/2016 14:09   Nm Pet Image Restage (ps) Whole Body  Result Date: 01/08/2017 CLINICAL DATA:  Initial treatment strategy for abdominal lymphadenopathy. EXAM: NUCLEAR MEDICINE PET WHOLE BODY TECHNIQUE: 12.2 mCi F-18 FDG was injected intravenously. Full-ring PET imaging was performed from the vertex to the feet after the radiotracer. CT data was obtained and used for attenuation correction and anatomic localization. FASTING BLOOD GLUCOSE:  Value: 91 mg/dl COMPARISON:  CT abdomen/pelvis dated 12/31/2016 FINDINGS: HEAD/NECK No hypermetabolic activity in the scalp. No hypermetabolic cervical lymph nodes. CHEST No suspicious pulmonary nodules on the CT scan. 7 mm short axis node at the left thoracic inlet (series 3/ image 94), max SUV 4.1. No hypermetabolic hilar nodes. ABDOMEN/PELVIS No abnormal hypermetabolic activity within the liver, pancreas, adrenal glands, or spleen. Status post hysterectomy. Hypermetabolic upper abdominal and retroperitoneal lymph nodes, including: --8 mm short axis node along the right hepatic renal fossa (series 3/ image 165), max SUV 8.3 --19 mm short axis right para-aortic node (series 3/ image 187), max SUV 7.3 --13 mm short axis left para-aortic node  (series 3/image 196), max SUV 7.0 --13 mm short axis left para-aortic node (series 3/image 205), max SUV 7.3 --12 mm short axis left para-aortic node (series 3/ image 206), max SUV 8.3 SKELETON No focal hypermetabolic activity to suggest skeletal metastasis. EXTREMITIES No abnormal hypermetabolic activity in the lower extremities. IMPRESSION: Hypermetabolic upper abdominal and retroperitoneal lymph nodes, measuring up to 1.9 cm short axis, worrisome for nodal metastases versus lymphoma. Hypermetabolic 7 mm short axis node at the left thoracic inlet, worrisome for lymphatic spread via the thoracic duct. Otherwise, no findings suspicious for primary malignancy. Electronically Signed   By: SJulian HyM.D.   On: 01/08/2017 10:20   Ct Biopsy  Result Date: 01/21/2017 CLINICAL DATA:  Hypermetabolic retroperitoneal adenopathy. No known primary. EXAM: CT GUIDED CORE BIOPSY OF LEFT RETROPERITONEAL ADENOPATHY ANESTHESIA/SEDATION: Intravenous Fentanyl and Versed were administered as conscious sedation during continuous monitoring of the patient's level of consciousness and physiological / cardiorespiratory status by the radiology RN, with a total moderate sedation time of 13  minutes. PROCEDURE: The procedure risks, benefits, and alternatives were explained to the patient. Questions regarding the procedure were encouraged and answered. The patient understands and consents to the procedure. Patient placed prone. Select axial scans through the abdomen obtained in the left periaortic adenopathy was localized and an appropriate skin entry site determined and marked. The operative field was prepped with chlorhexidinein a sterile fashion, and a sterile drape was applied covering the operative field. A sterile gown and sterile gloves were used for the procedure. Local anesthesia was provided with 1% Lidocaine. Under CT fluoroscopic guidance, a 17 gauge trocar needle was advanced to the margin of the lesion. Once needle tip  position was confirmed, coaxial 18-gauge core biopsy samples were obtained, submitted in saline to surgical pathology. The guide needle was removed. Postprocedure scans show no hemorrhage or other apparent complication. The patient tolerated the procedure well. COMPLICATIONS: None immediate FINDINGS: Left para-aortic and aortocaval retroperitoneal adenopathy was again localized. Core samples of a representative enlarged lymph node obtained as above. IMPRESSION: 1. Technically successful CT-guided core biopsy, left para-aortic retroperitoneal adenopathy. Electronically Signed   By: Lucrezia Europe M.D.   On: 01/21/2017 12:19   US Abdomen Limited Ruq  Result Date: 01/04/2017 CLINICAL DATA:  Abdominal pain for several months EXAM: US ABDOMEN LIMITED - RIGHT UPPER QUADRANT COMPARISON:  12/31/2016 FINDINGS: Gallbladder: Dilated gallbladder similar to that seen on prior CT examination. Multiple dependent densities are noted within consistent with cholelithiasis. No gallbladder wall thickening or pericholecystic fluid is noted. Common bile duct: Diameter: 7 mm. This is mildly prominent for the patient's given age but similar to that seen on recent CT examination. Liver: No focal lesion identified. Within normal limits in parenchymal echogenicity. IMPRESSION: Cholelithiasis without complicating factors. Mild prominence of the common bile duct of uncertain significance. Correlation with laboratory values is recommended. Electronically Signed   By: Inez Catalina M.D.   On: 01/04/2017 10:08     Assessment and plan- Patient is a 61 y.o. female with newly diagnosed stage II follicular lymphoma grade 1-2  I discussed the pathology findings with the patient in detail. It shows that adenopathy is from follicular lymphoma which is a low grade indolent lymphoma whichwaxes and wanes. Patient has some abdominal pain but that is mostly related to her eating. Her intra abdominal adenopathy is not bulky and not causing any organ  compromise. There are no associate cytopenias or B symptoms. All the lymph nodes are somewhat small and on the same side of diaphragm. Treatment for follicular lymphoma would be indicated only if patient is symptomatic, has cytopenias or bulky disease which is not the patients case. Treatment involves immunotherapy+/- chemotherapy and is not curative. Follicular lymphomas are known to recur and can sometimes transform into DLBCL. I explained all this to her and we have agreed to watch this conservatively without any active treatment at this time. She has a low FLIPI score and her 5 year prognosis is excellent. She does need a BM biopsy to complete staging work up. I do not suspect that she will have BM involvement.  Patient would like to get her BM biopsy in a month from now. I will see her after that to discuss her results and further management     Visit Diagnosis 1. Follicular lymphoma grade I of intra-abdominal lymph nodes ([place])   2. Abnormal computed tomography angiography (CTA) of abdomen and pelvis      Dr. Randa Evens, MD, MPH [place] [facility] Pager- [phone] 01/28/2017  8:59 AM

## 2017-01-29 ENCOUNTER — Other Ambulatory Visit: Payer: Self-pay | Admitting: Oncology

## 2017-01-29 DIAGNOSIS — R935 Abnormal findings on diagnostic imaging of other abdominal regions, including retroperitoneum: Secondary | ICD-10-CM

## 2017-02-05 ENCOUNTER — Other Ambulatory Visit: Payer: Self-pay | Admitting: Radiology

## 2017-02-06 ENCOUNTER — Inpatient Hospital Stay: Admission: RE | Admit: 2017-02-06 | Payer: BLUE CROSS/BLUE SHIELD | Source: Ambulatory Visit

## 2017-02-06 ENCOUNTER — Ambulatory Visit: Admission: RE | Admit: 2017-02-06 | Payer: BLUE CROSS/BLUE SHIELD | Source: Ambulatory Visit

## 2017-02-07 ENCOUNTER — Telehealth: Payer: Self-pay | Admitting: Gastroenterology

## 2017-02-07 NOTE — Telephone Encounter (Signed)
Andrea Lloyd is a self referral for a screening colonoscopy. She would like Dr. Allen Norris to do the colonoscopy.

## 2017-02-08 ENCOUNTER — Telehealth: Payer: Self-pay | Admitting: Gastroenterology

## 2017-02-08 ENCOUNTER — Other Ambulatory Visit: Payer: Self-pay

## 2017-02-08 DIAGNOSIS — Z1211 Encounter for screening for malignant neoplasm of colon: Secondary | ICD-10-CM

## 2017-02-08 NOTE — Telephone Encounter (Signed)
02/08/17 Spoke with Jeanette Caprice at [place] and NO prior Josem Kaufmann is required for Screening Colonoscopy [phone] / Z12.11

## 2017-02-08 NOTE — Telephone Encounter (Signed)
Screening colonoscopy at [facility] on 02/25/17 with Wohl. Please precert for I37.79 screening.

## 2017-02-11 ENCOUNTER — Ambulatory Visit: Payer: BLUE CROSS/BLUE SHIELD

## 2017-02-19 ENCOUNTER — Encounter: Payer: Self-pay | Admitting: *Deleted

## 2017-02-20 ENCOUNTER — Encounter: Payer: Self-pay | Admitting: Anesthesiology

## 2017-02-22 NOTE — Discharge Instructions (Signed)

## 2017-02-25 ENCOUNTER — Ambulatory Visit: Payer: BLUE CROSS/BLUE SHIELD | Admitting: Anesthesiology

## 2017-02-25 ENCOUNTER — Encounter
Admission: RE | Disposition: A | Discharge status: Home / Self Care | Payer: Self-pay | Source: Ambulatory Visit | Attending: Gastroenterology

## 2017-02-25 ENCOUNTER — Ambulatory Visit
Admission: RE | Admit: 2017-02-25 | Discharge: 2017-02-25 | Disposition: A | Discharge status: Home / Self Care | Payer: BLUE CROSS/BLUE SHIELD | Source: Ambulatory Visit | Attending: Gastroenterology | Admitting: Gastroenterology

## 2017-02-25 DIAGNOSIS — K640 First degree hemorrhoids: Secondary | ICD-10-CM | POA: Diagnosis not present

## 2017-02-25 DIAGNOSIS — D123 Benign neoplasm of transverse colon: Secondary | ICD-10-CM | POA: Diagnosis not present

## 2017-02-25 DIAGNOSIS — K219 Gastro-esophageal reflux disease without esophagitis: Secondary | ICD-10-CM | POA: Diagnosis not present

## 2017-02-25 DIAGNOSIS — K5730 Diverticulosis of large intestine without perforation or abscess without bleeding: Secondary | ICD-10-CM | POA: Diagnosis not present

## 2017-02-25 DIAGNOSIS — Z79899 Other long term (current) drug therapy: Secondary | ICD-10-CM | POA: Insufficient documentation

## 2017-02-25 DIAGNOSIS — N319 Neuromuscular dysfunction of bladder, unspecified: Secondary | ICD-10-CM | POA: Insufficient documentation

## 2017-02-25 DIAGNOSIS — J45909 Unspecified asthma, uncomplicated: Secondary | ICD-10-CM | POA: Diagnosis not present

## 2017-02-25 DIAGNOSIS — K635 Polyp of colon: Secondary | ICD-10-CM

## 2017-02-25 DIAGNOSIS — Z1211 Encounter for screening for malignant neoplasm of colon: Secondary | ICD-10-CM | POA: Diagnosis not present

## 2017-02-25 DIAGNOSIS — D125 Benign neoplasm of sigmoid colon: Secondary | ICD-10-CM | POA: Insufficient documentation

## 2017-02-25 HISTORY — DX: Family history of other specified conditions: Z84.89

## 2017-02-25 HISTORY — PX: POLYPECTOMY: SHX5525

## 2017-02-25 HISTORY — PX: COLONOSCOPY WITH PROPOFOL: SHX5780

## 2017-02-25 LAB — HM COLONOSCOPY

## 2017-02-25 SURGERY — COLONOSCOPY WITH PROPOFOL
Anesthesia: Monitor Anesthesia Care | Wound class: Contaminated

## 2017-02-25 MED ORDER — SODIUM CHLORIDE 0.9 % IV SOLN: INTRAVENOUS | Status: DC

## 2017-02-25 MED ORDER — LACTATED RINGERS IV SOLN: 1000.0000 mL | INTRAVENOUS | Status: DC

## 2017-02-25 MED ORDER — LIDOCAINE HCL (CARDIAC) 20 MG/ML IV SOLN
INTRAVENOUS | Status: DC | PRN
  Administered 2017-02-25: 50 mg via INTRAVENOUS

## 2017-02-25 MED ORDER — LACTATED RINGERS IV SOLN
50.0000 mL | INTRAVENOUS | Status: DC
  Administered 2017-02-25: 10:00:00 via INTRAVENOUS

## 2017-02-25 MED ORDER — PROPOFOL 10 MG/ML IV BOLUS
INTRAVENOUS | Status: DC | PRN
  Administered 2017-02-25 (×3): 50 mg via INTRAVENOUS
  Administered 2017-02-25: 100 mg via INTRAVENOUS
  Administered 2017-02-25 (×2): 50 mg via INTRAVENOUS

## 2017-02-25 MED ORDER — STERILE WATER FOR IRRIGATION IR SOLN
Status: DC | PRN
  Administered 2017-02-25: 10:00:00

## 2017-02-25 SURGICAL SUPPLY — 23 items

## 2017-02-25 NOTE — Anesthesia Procedure Notes (Signed)
Procedure Name: MAC Date/Time: 02/25/2017 9:30 AM Performed by: Janna Arch Pre-anesthesia Checklist: Patient identified, Emergency Drugs available, Suction available and Patient being monitored Patient Re-evaluated:Patient Re-evaluated prior to inductionOxygen Delivery Method: Nasal cannula

## 2017-02-25 NOTE — H&P (Signed)
   Andrea Lame, MD [address] [place], [place] [address] Phone: [phone] Fax : [phone]  Primary Care Physician:  Sherrin Daisy, MD Primary Gastroenterologist:  Dr. Allen Norris  Pre-Procedure History & Physical: HPI:  Andrea Lloyd is a 61 y.o. female is here for a screening colonoscopy.   Past Medical History:  Diagnosis Date  . Asthma   . Family history of adverse reaction to anesthesia    Father - slow to wake  . GERD (gastroesophageal reflux disease)   . Gravida 4 para 4     Past Surgical History:  Procedure Laterality Date  . ABDOMINAL HYSTERECTOMY     06 01 2013  . BLADDER SURGERY    . ROTATOR CUFF REPAIR Right   . TONSILLECTOMY  1972    Prior to Admission medications   Medication Sig Start Date End Date Taking? Authorizing Provider  albuterol (PROAIR HFA) 108 (90 Base) MCG/ACT inhaler Inhale into the lungs. 08/29/16 08/29/17 Yes Historical Provider, MD  Cholecalciferol (VITAMIN D3 PO) Take 20,000 mcg by mouth daily.   Yes Historical Provider, MD  Menaquinone-7 (VITAMIN K2 PO) Take 200 mcg by mouth daily.   Yes Historical Provider, MD  Multiple Vitamin (MULTI-VITAMIN DAILY PO) Take by mouth.   Yes Historical Provider, MD  Probiotic Product (PROBIOTIC PO) Take by mouth daily.   Yes Historical Provider, MD    Allergies as of 02/08/2017  . (No Known Allergies)    Family History  Problem Relation Age of Onset  . Alcohol abuse Maternal Grandfather   . Healthy Mother   . Breast cancer Mother   . Cancer Father   . Healthy Sister   . Healthy Brother   . Breast cancer Maternal Aunt   . Hyperlipidemia Neg Hx   . Heart disease Neg Hx   . Diabetes Neg Hx     Social History   Social History  . Marital status: Married    Spouse name: N/A  . Number of children: N/A  . Years of education: N/A   Occupational History  . Not on file.   Social History Main Topics  . Smoking status: Never Smoker  . Smokeless tobacco: Never Used  .  Alcohol use No  . Drug use: No  . Sexual activity: Not Currently   Other Topics Concern  . Not on file   Social History Narrative  . No narrative on file    Review of Systems: See HPI, otherwise negative ROS  Physical Exam: BP 119/85   Pulse 85   Temp 97.7 F (36.5 C) (Temporal)   Resp 16   Ht 5\' 6"  (1.676 m)   Wt 190 lb (86.2 kg)   SpO2 99%   BMI 30.67 kg/m  General:   Alert,  pleasant and cooperative in NAD Head:  Normocephalic and atraumatic. Neck:  Supple; no masses or thyromegaly. Lungs:  Clear throughout to auscultation.    Heart:  Regular rate and rhythm. Abdomen:  Soft, nontender and nondistended. Normal bowel sounds, without guarding, and without rebound.   Neurologic:  Alert and  oriented x4;  grossly normal neurologically.  Impression/Plan: Andrea Lloyd is now here to undergo a screening colonoscopy.  Risks, benefits, and alternatives regarding colonoscopy have been reviewed with the patient.  Questions have been answered.  All parties agreeable.

## 2017-02-25 NOTE — Transfer of Care (Signed)
Immediate Anesthesia Transfer of Care Note  Patient: Andrea Lloyd  Procedure(s) Performed: Procedure(s): COLONOSCOPY WITH PROPOFOL (N/A) POLYPECTOMY  Patient Location: PACU  Anesthesia Type: MAC  Level of Consciousness: awake, alert  and patient cooperative  Airway and Oxygen Therapy: Patient Spontanous Breathing and Patient connected to supplemental oxygen  Post-op Assessment: Post-op Vital signs reviewed, Patient's Cardiovascular Status Stable, Respiratory Function Stable, Patent Airway and No signs of Nausea or vomiting  Post-op Vital Signs: Reviewed and stable  Complications: No apparent anesthesia complications

## 2017-02-25 NOTE — Anesthesia Preprocedure Evaluation (Addendum)
Anesthesia Evaluation  Patient identified by MRN, date of birth, ID band Patient awake    History of Anesthesia Complications (+) history of anesthetic complications (Father took a long time to wake up)  Airway Mallampati: I  TM Distance: >3 FB Neck ROM: Full    Dental no notable dental hx.    Pulmonary asthma ,    Pulmonary exam normal breath sounds clear to auscultation       Cardiovascular negative cardio ROS Normal cardiovascular exam Rhythm:Regular Rate:Normal     Neuro/Psych negative neurological ROS  negative psych ROS   GI/Hepatic Neg liver ROS, GERD  ,  Endo/Other  negative endocrine ROS  Renal/GU negative Renal ROS Bladder dysfunction      Musculoskeletal negative musculoskeletal ROS (+)   Abdominal Normal abdominal exam  (+)  Abdomen: soft.    Peds  Hematology Lymphoma   Anesthesia Other Findings   Reproductive/Obstetrics s/p TAH/BSO 2013                            Anesthesia Physical Anesthesia Plan  ASA: II  Anesthesia Plan: MAC   Post-op Pain Management:    Induction:   Airway Management Planned: Mask  Additional Equipment: None  Intra-op Plan:   Post-operative Plan:   Informed Consent: I have reviewed the patients History and Physical, chart, labs and discussed the procedure including the risks, benefits and alternatives for the proposed anesthesia with the patient or authorized representative who has indicated his/her understanding and acceptance.     Plan Discussed with:   Anesthesia Plan Comments:         Anesthesia Quick Evaluation

## 2017-02-25 NOTE — Anesthesia Postprocedure Evaluation (Signed)
Anesthesia Post Note  Patient: Andrea Lloyd  Procedure(s) Performed: Procedure(s) (LRB): COLONOSCOPY WITH PROPOFOL (N/A) POLYPECTOMY  Patient location during evaluation: PACU Anesthesia Type: MAC Level of consciousness: awake Pain management: pain level controlled Respiratory status: spontaneous breathing Cardiovascular status: blood pressure returned to baseline Postop Assessment: no headache Anesthetic complications: no    Lavonna Monarch

## 2017-02-25 NOTE — Op Note (Signed)
[facility]astroenterology Patient Name: Andrea Lloyd Procedure Date: 02/25/2017 9:20 AM MRN: [id number] Account #: [id number] Date of Birth: 1956-02-17 Admit Type: Outpatient Age: 61 Room: [facility] OR ROOM 01 Gender: Female Note Status: Finalized Procedure:            Colonoscopy Indications:          Screening for colorectal malignant neoplasm Providers:            Lucilla Lame MD, MD Referring MD:         Shirline Frees MD, MD (Referring MD) Medicines:            Propofol per Anesthesia Complications:        No immediate complications. Procedure:            Pre-Anesthesia Assessment:                       - Prior to the procedure, a History and Physical was                        performed, and patient medications and allergies were                        reviewed. The patient's tolerance of previous                        anesthesia was also reviewed. The risks and benefits of                        the procedure and the sedation options and risks were                        discussed with the patient. All questions were                        answered, and informed consent was obtained. Prior                        Anticoagulants: The patient has taken no previous                        anticoagulant or antiplatelet agents. ASA Grade                        Assessment: II - A patient with mild systemic disease.                        After reviewing the risks and benefits, the patient was                        deemed in satisfactory condition to undergo the                        procedure.                       After obtaining informed consent, the colonoscope was                        passed under direct vision. Throughout the procedure,  the patient's blood pressure, pulse, and oxygen                        saturations were monitored continuously. The [place] [id number]) was introduced  through the                        anus and advanced to the the cecum, identified by                        appendiceal orifice and ileocecal valve. The                        colonoscopy was performed without difficulty. The                        patient tolerated the procedure well. The quality of                        the bowel preparation was excellent. Findings:      The perianal and digital rectal examinations were normal.      A 3 mm polyp was found in the transverse colon. The polyp was sessile.       The polyp was removed with a cold biopsy forceps. Resection and       retrieval were complete.      A 4 mm polyp was found in the sigmoid colon. The polyp was sessile. The       polyp was removed with a cold biopsy forceps. Resection and retrieval       were complete.      Multiple small-mouthed diverticula were found in the sigmoid colon.      Non-bleeding internal hemorrhoids were found during retroflexion. The       hemorrhoids were Grade I (internal hemorrhoids that do not prolapse). Impression:           - One 3 mm polyp in the transverse colon, removed with                        a cold biopsy forceps. Resected and retrieved.                       - One 4 mm polyp in the sigmoid colon, removed with a                        cold biopsy forceps. Resected and retrieved.                       - Diverticulosis in the sigmoid colon.                       - Non-bleeding internal hemorrhoids. Recommendation:       - Discharge patient to home.                       - Resume previous diet.                       - Continue present medications.                       -  Repeat colonoscopy in 5 years if polyp adenoma and 10                        years if hyperplastic Procedure Code(s):    --- Professional ---                       [phone], Colonoscopy, flexible; with biopsy, single or                        multiple Diagnosis Code(s):    --- Professional ---                       Z12.11,  Encounter for screening for malignant neoplasm                        of colon                       D12.3, Benign neoplasm of transverse colon (hepatic                        flexure or splenic flexure)                       D12.5, Benign neoplasm of sigmoid colon CPT copyright 2016 American Medical Association. All rights reserved. The codes documented in this report are preliminary and upon coder review may  be revised to meet current compliance requirements. Lucilla Lame MD, MD 02/25/2017 9:51:06 AM This report has been signed electronically. Number of Addenda: 0 Note Initiated On: 02/25/2017 9:20 AM Scope Withdrawal Time: 0 hours 6 minutes 2 seconds  Total Procedure Duration: 0 hours 9 minutes 25 seconds       [facility]

## 2017-02-26 ENCOUNTER — Encounter: Payer: Self-pay | Admitting: Gastroenterology

## 2017-02-27 ENCOUNTER — Encounter: Payer: Self-pay | Admitting: Gastroenterology

## 2017-02-28 ENCOUNTER — Encounter: Payer: Self-pay | Admitting: Gastroenterology

## 2017-03-08 ENCOUNTER — Ambulatory Visit: Payer: BLUE CROSS/BLUE SHIELD | Admitting: Oncology

## 2017-03-08 ENCOUNTER — Other Ambulatory Visit: Payer: Self-pay

## 2017-03-12 ENCOUNTER — Other Ambulatory Visit: Payer: Self-pay | Admitting: Radiology

## 2017-03-13 ENCOUNTER — Other Ambulatory Visit (HOSPITAL_COMMUNITY)
Admission: RE | Admit: 2017-03-13 | Discharge: 2017-03-13 | Disposition: A | Discharge status: Home / Self Care | Payer: BLUE CROSS/BLUE SHIELD | Source: Ambulatory Visit | Attending: Oncology | Admitting: Oncology

## 2017-03-13 ENCOUNTER — Ambulatory Visit
Admission: RE | Admit: 2017-03-13 | Discharge: 2017-03-13 | Disposition: A | Discharge status: Home / Self Care | Payer: BLUE CROSS/BLUE SHIELD | Source: Ambulatory Visit | Attending: Interventional Radiology | Admitting: Interventional Radiology

## 2017-03-13 DIAGNOSIS — J45909 Unspecified asthma, uncomplicated: Secondary | ICD-10-CM | POA: Insufficient documentation

## 2017-03-13 DIAGNOSIS — C8200 Follicular lymphoma grade I, unspecified site: Secondary | ICD-10-CM | POA: Insufficient documentation

## 2017-03-13 DIAGNOSIS — K219 Gastro-esophageal reflux disease without esophagitis: Secondary | ICD-10-CM | POA: Insufficient documentation

## 2017-03-13 DIAGNOSIS — R935 Abnormal findings on diagnostic imaging of other abdominal regions, including retroperitoneum: Secondary | ICD-10-CM

## 2017-03-13 LAB — PROTIME-INR
INR: 0.94
PROTHROMBIN TIME: 12.6 s (ref 11.4–15.2)

## 2017-03-13 LAB — CBC WITH DIFFERENTIAL/PLATELET
BASOS ABS: 0 10*3/uL (ref 0–0.1)
BASOS PCT: 0 %
Eosinophils Absolute: 0.2 10*3/uL (ref 0–0.7)
Eosinophils Relative: 2 %
HCT: 40.5 % (ref 35.0–47.0)
Hemoglobin: 13.9 g/dL (ref 12.0–16.0)
Lymphocytes Relative: 25 %
Lymphs Abs: 1.8 10*3/uL (ref 1.0–3.6)
MCH: 29.6 pg (ref 26.0–34.0)
MCHC: 34.2 g/dL (ref 32.0–36.0)
MCV: 86.6 fL (ref 80.0–100.0)
MONO ABS: 0.5 10*3/uL (ref 0.2–0.9)
MONOS PCT: 6 %
Neutro Abs: 4.8 10*3/uL (ref 1.4–6.5)
Neutrophils Relative %: 67 %
PLATELETS: 239 10*3/uL (ref 150–440)
RBC: 4.68 MIL/uL (ref 3.80–5.20)
RDW: 14.5 % (ref 11.5–14.5)
WBC: 7.2 10*3/uL (ref 3.6–11.0)

## 2017-03-13 LAB — APTT: aPTT: 29 seconds (ref 24–36)

## 2017-03-13 MED ORDER — MIDAZOLAM HCL 5 MG/5ML IJ SOLN
INTRAMUSCULAR | Status: AC | PRN
  Administered 2017-03-13: 1 mg via INTRAVENOUS
  Administered 2017-03-13: 0.5 mg via INTRAVENOUS
  Administered 2017-03-13: 1 mg via INTRAVENOUS

## 2017-03-13 MED ORDER — FENTANYL CITRATE (PF) 100 MCG/2ML IJ SOLN
INTRAMUSCULAR | Status: AC | PRN
  Administered 2017-03-13: 25 ug via INTRAVENOUS
  Administered 2017-03-13: 50 ug via INTRAVENOUS

## 2017-03-13 MED ORDER — SODIUM CHLORIDE 0.9 % IV SOLN
INTRAVENOUS | Status: DC
  Administered 2017-03-13: 09:00:00 via INTRAVENOUS

## 2017-03-13 NOTE — H&P (Signed)
Chief Complaint: Patient was seen in consultation today for bone marrow biopsy at the request of Lucendia Herrlich  Referring Physician(s): Lucendia Herrlich  Patient Status: [facility] - Out-pt  History of Present Illness: Andrea Lloyd is a 61 y.o. female status post RP LN biopsy on 3/26 demonstrating low grade follicular lymphoma.  Here now for BM biopsy to complete stating work up. Currently asymptomatic.  Past Medical History:  Diagnosis Date  . Asthma   . Family history of adverse reaction to anesthesia    Father - slow to wake  . GERD (gastroesophageal reflux disease)   . Gravida 4 para 4     Past Surgical History:  Procedure Laterality Date  . ABDOMINAL HYSTERECTOMY     06 01 2013  . BLADDER SURGERY    . COLONOSCOPY WITH PROPOFOL N/A 02/25/2017   Procedure: COLONOSCOPY WITH PROPOFOL;  Surgeon: Lucilla Lame, MD;  Location: [place];  Service: Endoscopy;  Laterality: N/A;  . POLYPECTOMY  02/25/2017   Procedure: POLYPECTOMY;  Surgeon: Lucilla Lame, MD;  Location: [place];  Service: Endoscopy;;  . ROTATOR CUFF REPAIR Right   . TONSILLECTOMY  1972    Allergies: Adhesive [tape]  Medications: Prior to Admission medications   Medication Sig Start Date End Date Taking? Authorizing Provider  albuterol (PROAIR HFA) 108 (90 Base) MCG/ACT inhaler Inhale into the lungs. 08/29/16 08/29/17 Yes [provider]  Cholecalciferol (VITAMIN D3 PO) Take 20,000 mcg by mouth daily.   Yes [provider]  Menaquinone-7 (VITAMIN K2 PO) Take 200 mcg by mouth daily.   Yes [provider]  Multiple Vitamin (MULTI-VITAMIN DAILY PO) Take by mouth.   Yes [provider]  Probiotic Product (PROBIOTIC PO) Take by mouth daily.   Yes [provider]     Family History  Problem Relation Age of Onset  . Alcohol abuse Maternal Grandfather   . Healthy Mother   . Breast cancer Mother   . Cancer Father   . Healthy Sister   .  Healthy Brother   . Breast cancer Maternal Aunt   . Hyperlipidemia Neg Hx   . Heart disease Neg Hx   . Diabetes Neg Hx     Social History   Social History  . Marital status: Married    Spouse name: N/A  . Number of children: N/A  . Years of education: N/A   Social History Main Topics  . Smoking status: Never Smoker  . Smokeless tobacco: Never Used  . Alcohol use No  . Drug use: No  . Sexual activity: Not Currently   Other Topics Concern  . None   Social History Narrative  . None    ECOG Status: 0 - Asymptomatic  Review of Systems: A 12 point ROS discussed and pertinent positives are indicated in the HPI above.  All other systems are negative.  Review of Systems  Constitutional: Negative.   HENT: Negative.   Respiratory: Negative.   Cardiovascular: Negative.   Gastrointestinal: Negative.   Genitourinary: Negative.   Musculoskeletal: Negative.   Neurological: Negative.     Vital Signs: BP 111/81   Pulse 77   Temp 98.2 F (36.8 C) (Oral)   Resp 15   SpO2 98%   Physical Exam  Constitutional: She is oriented to person, place, and time. She appears well-developed and well-nourished. No distress.  HENT:  Head: Normocephalic and atraumatic.  Neck: Neck supple. No JVD present. No tracheal deviation present. No thyromegaly present.  Cardiovascular: Normal rate, regular rhythm and normal heart sounds.  Exam reveals no gallop and no friction rub.   No murmur heard. Pulmonary/Chest: Effort normal and breath sounds normal. No stridor. No respiratory distress. She has no wheezes. She has no rales.  Abdominal: Soft. Bowel sounds are normal. She exhibits no distension and no mass. There is no tenderness. There is no rebound and no guarding.  Musculoskeletal: She exhibits no edema.  Lymphadenopathy:    She has no cervical adenopathy.  Neurological: She is alert and oriented to person, place, and time.  Skin: She is not diaphoretic.  Vitals reviewed.   Mallampati  Score:  MD Evaluation Airway: WNL Heart: WNL Abdomen: WNL Chest/ Lungs: WNL ASA  Classification: 2 Mallampati/Airway Score: One  Imaging: No results found.  Labs:  CBC:  Recent Labs  01/21/17 1015 03/13/17 0828  WBC 7.0 7.2  HGB 13.7 13.9  HCT 40.9 40.5  PLT 268 239    COAGS:  Recent Labs  01/21/17 1015 03/13/17 0828  INR 0.98 0.94  APTT 30 29    Assessment and Plan:  For CT guided bone marrow biopsy today.  Risks and benefits discussed with the patient including, but not limited to bleeding, infection, damage to adjacent structures or low yield requiring additional tests. All of the patient's questions were answered, patient is agreeable to proceed. Consent signed and in chart.  Thank you for this interesting consult.  I greatly enjoyed meeting Andrea Lloyd and look forward to participating in their care.  A copy of this report was sent to the requesting provider on this date.  Electronically SignedAletta Edouard T 03/13/2017, 8:57 AM   I spent a total of 15 Minutes in face to face in clinical consultation, greater than 50% of which was counseling/coordinating care for bone marrow biopsy.

## 2017-03-13 NOTE — Procedures (Signed)
Interventional Radiology Procedure Note  Procedure: CT guided aspirate and core biopsy of right iliac bone Complications: None Recommendations: - Bedrest supine x 1 hrs - Follow biopsy results  Bless Lisenby T. Ada Woodbury, M.D Pager:  [phone]   

## 2017-03-21 ENCOUNTER — Ambulatory Visit: Payer: BLUE CROSS/BLUE SHIELD | Admitting: Oncology

## 2017-03-27 ENCOUNTER — Telehealth: Payer: Self-pay | Admitting: *Deleted

## 2017-03-27 LAB — CHROMOSOME ANALYSIS, BONE MARROW

## 2017-03-27 NOTE — Telephone Encounter (Signed)
Asking for BM results and please call her tomorrow She does not have an appt until 6/14 [phone]

## 2017-03-28 NOTE — Telephone Encounter (Signed)
Please let her know bm biopsy showed no evidence of follicular lymphoma.

## 2017-03-28 NOTE — Telephone Encounter (Signed)
Patient informed and was ecstatic

## 2017-03-29 ENCOUNTER — Inpatient Hospital Stay: Payer: BLUE CROSS/BLUE SHIELD | Admitting: Oncology

## 2017-04-08 ENCOUNTER — Encounter (HOSPITAL_COMMUNITY): Payer: Self-pay

## 2017-04-11 ENCOUNTER — Encounter: Payer: Self-pay | Admitting: Oncology

## 2017-04-11 ENCOUNTER — Inpatient Hospital Stay: Payer: BLUE CROSS/BLUE SHIELD | Attending: Oncology | Admitting: Oncology

## 2017-04-11 VITALS — BP 147/78 | HR 86 | Temp 98.9°F | Resp 18 | Wt 196.4 lb

## 2017-04-11 DIAGNOSIS — Z90710 Acquired absence of both cervix and uterus: Secondary | ICD-10-CM | POA: Diagnosis not present

## 2017-04-11 DIAGNOSIS — K219 Gastro-esophageal reflux disease without esophagitis: Secondary | ICD-10-CM | POA: Insufficient documentation

## 2017-04-11 DIAGNOSIS — C8203 Follicular lymphoma grade I, intra-abdominal lymph nodes: Secondary | ICD-10-CM | POA: Insufficient documentation

## 2017-04-11 DIAGNOSIS — J45909 Unspecified asthma, uncomplicated: Secondary | ICD-10-CM | POA: Diagnosis not present

## 2017-04-11 DIAGNOSIS — Z79899 Other long term (current) drug therapy: Secondary | ICD-10-CM | POA: Insufficient documentation

## 2017-04-11 NOTE — Progress Notes (Signed)
Here for follow up

## 2017-04-11 NOTE — Progress Notes (Signed)
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   2. This was followed by a CT of the abdomen which showed: Multiple enlarged abdominal and left common iliac nodes. Findings are worrisome for either metastatic adenopathy from a abdominal primary versus lymphoproliferative disorder. Consider further investigation with PET-CT and tissue sampling.  3. PET/CT scan from 01/08/2017 showed:Hypermetabolic upper abdominal and retroperitoneal lymph nodes, measuring up to 1.9 cm short axis, worrisome for nodal metastases versus lymphoma.  Hypermetabolic 7 mm short axis node at the left thoracic inlet, worrisome for lymphatic spread via the thoracic duct.  4. Tumor markers including CEA and CA-19-9 was normal at 1 and 8 respectively.  5. She is otherwise doing well and denies any complaints of unintentional weight loss, loss of appetite, fevers or chills or drenching night sweats. Her abdominal pain is mostly associated with eating and sometimes comes on a day later. It is mainly in her left lower quadrant and is a dull aching pain but sometimes she seems to have pain in the epigastrium and right upper quadrant  6. Patient underwent IR guided retroperitoneal LN biopsy which showed : DIAGNOSIS:  A. LYMPH NODE, LEFT RETROPERITONEAL; CT-GUIDED  BIOPSY:  - FOLLICULAR LYMPHOMA, GRADE 1-2.   7. Bone marrow biopsy negative for lymphoma. Normal karyotype and cytogenetics  Interval history- doing well. Denies any complaints  ECOG PS- 0 Pain scale- 0   Review of systems- Review of Systems  Constitutional: Negative for chills, fever, malaise/fatigue and weight loss.  HENT: Negative for congestion, ear discharge and nosebleeds.   Eyes: Negative for blurred vision.  Respiratory: Negative for cough, hemoptysis, sputum production, shortness of breath and wheezing.   Cardiovascular: Negative for chest pain, palpitations, orthopnea and claudication.  Gastrointestinal: Negative for abdominal pain, blood in stool, constipation, diarrhea, heartburn, melena, nausea and vomiting.  Genitourinary: Negative for dysuria, flank pain, frequency, hematuria and urgency.  Musculoskeletal: Negative for back pain, joint pain and myalgias.  Skin: Negative for rash.  Neurological: Negative for dizziness, tingling, focal weakness, seizures, weakness and headaches.  Endo/Heme/Allergies: Does not bruise/bleed easily.  Psychiatric/Behavioral: Negative for depression and suicidal ideas. The patient does not have insomnia.        Allergies  Allergen Reactions  . Adhesive [Tape] Rash    Bandaids     Past Medical History:  Diagnosis Date  . Asthma   . Family history of adverse reaction to anesthesia    Father - slow to wake  . GERD (gastroesophageal reflux disease)   . Gravida 4 para 4      Past Surgical History:  Procedure Laterality Date  . ABDOMINAL HYSTERECTOMY     06 01 2013  . BLADDER SURGERY    . COLONOSCOPY WITH PROPOFOL N/A 02/25/2017   Procedure: COLONOSCOPY WITH PROPOFOL;  Surgeon: Lucilla Lame, MD;  Location: [place];  Service: Endoscopy;  Laterality: N/A;  . POLYPECTOMY  02/25/2017  Procedure: POLYPECTOMY;  Surgeon: Lucilla Lame, MD;  Location: [place];  Service: Endoscopy;;  . ROTATOR CUFF REPAIR Right   .  TONSILLECTOMY  1972    Social History   Social History  . Marital status: Married    Spouse name: N/A  . Number of children: N/A  . Years of education: N/A   Occupational History  . Not on file.   Social History Main Topics  . Smoking status: Never Smoker  . Smokeless tobacco: Never Used  . Alcohol use No  . Drug use: No  . Sexual activity: Not Currently   Other Topics Concern  . Not on file   Social History Narrative  . No narrative on file    Family History  Problem Relation Age of Onset  . Alcohol abuse Maternal Grandfather   . Healthy Mother   . Breast cancer Mother   . Cancer Father   . Healthy Sister   . Healthy Brother   . Breast cancer Maternal Aunt   . Hyperlipidemia Neg Hx   . Heart disease Neg Hx   . Diabetes Neg Hx      Current Outpatient Prescriptions:  .  Cholecalciferol (VITAMIN D3 PO), Take 20,000 mcg by mouth daily., Disp: , Rfl:  .  Menaquinone-7 (VITAMIN K2 PO), Take 200 mcg by mouth daily., Disp: , Rfl:  .  Multiple Vitamin (MULTI-VITAMIN DAILY PO), Take by mouth., Disp: , Rfl:  .  Probiotic Product (PROBIOTIC PO), Take by mouth daily., Disp: , Rfl:  .  albuterol (PROAIR HFA) 108 (90 Base) MCG/ACT inhaler, Inhale into the lungs., Disp: , Rfl:   Physical exam:  Vitals:   04/11/17 1459  BP: (!) 147/78  Pulse: 86  Resp: 18  Temp: 98.9 F (37.2 C)  TempSrc: Tympanic  Weight: 196 lb 6.4 oz (89.1 kg)   Physical Exam  Constitutional: She is oriented to person, place, and time and well-developed, well-nourished, and in no distress.  HENT:  Head: Normocephalic and atraumatic.  Eyes: EOM are normal. Pupils are equal, round, and reactive to light.  Neck: Normal range of motion.  Cardiovascular: Normal rate, regular rhythm and normal heart sounds.   Pulmonary/Chest: Effort normal and breath sounds normal.  Abdominal: Soft. Bowel sounds are normal.  Neurological: She is alert and oriented to person, place, and time.  Skin: Skin is warm and  dry.     CMP Latest Ref Rng & Units 01/25/2013  Glucose 65 - 99 mg/dL 95  BUN 7 - 18 mg/dL 16  Creatinine 0.60 - 1.30 mg/dL 0.83  Sodium 136 - 145 mmol/L 137  Potassium 3.5 - 5.1 mmol/L 3.9  Chloride 98 - 107 mmol/L 104  CO2 21 - 32 mmol/L 25  Calcium 8.5 - 10.1 mg/dL 9.1  Total Protein 6.4 - 8.2 g/dL 7.9  Total Bilirubin 0.2 - 1.0 mg/dL 0.6  Alkaline Phos 50 - 136 Unit/L 92  AST 15 - 37 Unit/L 21  ALT 12 - 78 U/L 40   CBC Latest Ref Rng & Units 03/13/2017  WBC 3.6 - 11.0 K/uL 7.2  Hemoglobin 12.0 - 16.0 g/dL 13.9  Hematocrit 35.0 - 47.0 % 40.5  Platelets 150 - 440 K/uL 239    No images are attached to the encounter.  Ct Bone Marrow Biopsy & Aspiration  Result Date: 03/13/2017 CLINICAL DATA:  Low-grade follicular lymphoma and need for bone marrow biopsy for further staging workup. EXAM: CT GUIDED BONE MARROW ASPIRATION AND BIOPSY ANESTHESIA/SEDATION: Versed 2.5 mg  IV, Fentanyl 75 mcg IV Total Moderate Sedation Time:  17 minutes. The patient's level of consciousness and physiologic status were continuously monitored during the procedure by Radiology nursing. PROCEDURE: The procedure risks, benefits, and alternatives were explained to the patient. Questions regarding the procedure were encouraged and answered. The patient understands and consents to the procedure. A time out was performed prior to initiating the procedure. The right gluteal region was prepped with chlorhexidine. Sterile gown and sterile gloves were used for the procedure. Local anesthesia was provided with 1% Lidocaine. Under CT guidance, an 11 gauge On Control bone cutting needle was advanced from a posterior approach into the right iliac bone. Needle positioning was confirmed with CT. Initial non heparinized and heparinized aspirate samples were obtained of bone marrow. Core biopsy was performed via the On Control drill needle. COMPLICATIONS: None FINDINGS: Inspection of initial aspirate did reveal visible particles.  Intact core biopsy sample was obtained. IMPRESSION: CT guided bone marrow biopsy of right posterior iliac bone with both aspirate and core samples obtained. Electronically Signed   By: Aletta Edouard M.D.   On: 03/13/2017 11:45     Assessment and plan- Patient is a 61 y.o. 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 No B symptoms. Incidentally discovered follicular lymphoma which does not meet GELF criteria for treatment. rtc in 6 months with cbc, cmp and LDH. Will rescan in 1 years time. Discussed bone marrow biopsy findings which were negative for lymphoma   Visit Diagnosis 1. Follicular lymphoma grade I of intra-abdominal lymph nodes ([place])      Dr. Randa Evens, MD, MPH [facility] Pager- [phone] 04/11/2017 3:48 PM

## 2017-04-15 ENCOUNTER — Encounter: Admission: RE | Payer: Self-pay | Source: Ambulatory Visit

## 2017-04-15 ENCOUNTER — Ambulatory Visit
Admission: RE | Admit: 2017-04-15 | Payer: BLUE CROSS/BLUE SHIELD | Source: Ambulatory Visit | Admitting: Gastroenterology

## 2017-04-15 SURGERY — COLONOSCOPY WITH PROPOFOL
Anesthesia: General

## 2017-10-10 ENCOUNTER — Inpatient Hospital Stay: Payer: BLUE CROSS/BLUE SHIELD

## 2017-10-10 ENCOUNTER — Inpatient Hospital Stay: Payer: BLUE CROSS/BLUE SHIELD | Attending: Oncology | Admitting: Oncology

## 2017-10-10 ENCOUNTER — Encounter: Payer: Self-pay | Admitting: Oncology

## 2017-10-10 VITALS — BP 128/84 | HR 74 | Temp 98.4°F | Resp 16 | Wt 198.0 lb

## 2017-10-10 DIAGNOSIS — Z79899 Other long term (current) drug therapy: Secondary | ICD-10-CM | POA: Insufficient documentation

## 2017-10-10 DIAGNOSIS — J45909 Unspecified asthma, uncomplicated: Secondary | ICD-10-CM | POA: Diagnosis not present

## 2017-10-10 DIAGNOSIS — C8203 Follicular lymphoma grade I, intra-abdominal lymph nodes: Secondary | ICD-10-CM | POA: Diagnosis not present

## 2017-10-10 DIAGNOSIS — Z90710 Acquired absence of both cervix and uterus: Secondary | ICD-10-CM | POA: Diagnosis not present

## 2017-10-10 DIAGNOSIS — K219 Gastro-esophageal reflux disease without esophagitis: Secondary | ICD-10-CM | POA: Diagnosis not present

## 2017-10-10 LAB — COMPREHENSIVE METABOLIC PANEL
ALBUMIN: 4.4 g/dL (ref 3.5–5.0)
ALK PHOS: 81 U/L (ref 38–126)
ALT: 23 U/L (ref 14–54)
ANION GAP: 9 (ref 5–15)
AST: 23 U/L (ref 15–41)
BUN: 20 mg/dL (ref 6–20)
CO2: 27 mmol/L (ref 22–32)
CREATININE: 0.75 mg/dL (ref 0.44–1.00)
Calcium: 9.3 mg/dL (ref 8.9–10.3)
Chloride: 102 mmol/L (ref 101–111)
GFR calc Af Amer: >60 mL/min (ref >60)
GFR calc non Af Amer: >60 mL/min (ref >60)
GLUCOSE: 107 mg/dL — AB (ref 65–99)
Potassium: 3.8 mmol/L (ref 3.5–5.1)
SODIUM: 138 mmol/L (ref 135–145)
Total Bilirubin: 1 mg/dL (ref 0.3–1.2)
Total Protein: 7.7 g/dL (ref 6.5–8.1)

## 2017-10-10 LAB — CBC WITH DIFFERENTIAL/PLATELET
BASOS PCT: 1 %
Basophils Absolute: 0.1 10*3/uL (ref 0–0.1)
EOS PCT: 2 %
Eosinophils Absolute: 0.2 10*3/uL (ref 0–0.7)
HEMATOCRIT: 42.4 % (ref 35.0–47.0)
Hemoglobin: 13.9 g/dL (ref 12.0–16.0)
Lymphocytes Relative: 21 %
Lymphs Abs: 1.8 10*3/uL (ref 1.0–3.6)
MCH: 28.9 pg (ref 26.0–34.0)
MCHC: 32.9 g/dL (ref 32.0–36.0)
MCV: 87.8 fL (ref 80.0–100.0)
MONO ABS: 0.5 10*3/uL (ref 0.2–0.9)
MONOS PCT: 5 %
NEUTROS ABS: 6.2 10*3/uL (ref 1.4–6.5)
Neutrophils Relative %: 71 %
PLATELETS: 282 10*3/uL (ref 150–440)
RBC: 4.82 MIL/uL (ref 3.80–5.20)
RDW: 13.9 % (ref 11.5–14.5)
WBC: 8.7 10*3/uL (ref 3.6–11.0)

## 2017-10-10 LAB — LACTATE DEHYDROGENASE: LDH: 142 U/L (ref 98–192)

## 2017-10-10 NOTE — Progress Notes (Signed)
Patient here for follow up with labs today. She states that she feels well and denies having any pain. She is currently not taking any medications.

## 2017-10-11 NOTE — Progress Notes (Signed)
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   2. This was followed by a CT of the abdomen which showed: Multiple enlarged abdominal and left common iliac nodes. Findings are worrisome for either metastatic adenopathy from a abdominal primary versus lymphoproliferative disorder. Consider further investigation with PET-CT and tissue sampling.  3. PET/CT scan from 01/08/2017 showed:Hypermetabolic upper abdominal and retroperitoneal lymph nodes, measuring up to 1.9 cm short axis, worrisome for nodal metastases versus lymphoma.  Hypermetabolic 7 mm short axis node at the left thoracic inlet, worrisome for lymphatic spread via the thoracic duct.  4. Tumor markers including CEA and CA-19-9 was normal at 1 and 8 respectively.  5. She is otherwise doing well and denies any complaints of unintentional weight loss, loss of appetite, fevers or chills or drenching night sweats. Her abdominal pain is mostly associated with eating and sometimes comes on a day later. It is mainly in her left lower quadrant and is a dull aching pain but sometimes she seems to have pain in the epigastrium and right upper quadrant  6. Patient underwent IR guided retroperitoneal LN biopsy which showed : DIAGNOSIS:  A. LYMPH NODE, LEFT RETROPERITONEAL; CT-GUIDED  BIOPSY:  - FOLLICULAR LYMPHOMA, GRADE 1-2.   7. Bone marrow biopsy negative for lymphoma. Normal karyotype and cytogenetics    Interval history-she is doing well and denies any symptoms of fevers, chills, drenching night sweats, unintentional weight loss, fatigue or lumps or bumps anywhere  ECOG PS- 0 Pain scale- 0   Review of systems- Review of Systems  Constitutional: Negative for chills, fever, malaise/fatigue and weight loss.  HENT: Negative for congestion, ear discharge and nosebleeds.   Eyes: Negative for blurred vision.  Respiratory: Negative for cough, hemoptysis, sputum production, shortness of breath and wheezing.   Cardiovascular: Negative for chest pain, palpitations, orthopnea and claudication.  Gastrointestinal: Negative for abdominal pain, blood in stool, constipation, diarrhea, heartburn, melena, nausea and vomiting.  Genitourinary: Negative for dysuria, flank pain, frequency, hematuria and urgency.  Musculoskeletal: Negative for back pain, joint pain and myalgias.  Skin: Negative for rash.  Neurological: Negative for dizziness, tingling, focal weakness, seizures, weakness and headaches.  Endo/Heme/Allergies: Does not bruise/bleed easily.  Psychiatric/Behavioral: Negative for depression and suicidal ideas. The patient does not have insomnia.      Allergies  Allergen Reactions  . Adhesive [Tape] Rash    Bandaids     Past Medical History:  Diagnosis Date  . Asthma   . Family history of adverse reaction to anesthesia    Father - slow to wake  . GERD (gastroesophageal reflux disease)   . Gravida 4 para 4      Past Surgical History:  Procedure Laterality Date  . ABDOMINAL HYSTERECTOMY     06 01 2013  . BLADDER SURGERY    . COLONOSCOPY WITH PROPOFOL N/A 02/25/2017   Procedure: COLONOSCOPY WITH PROPOFOL;  Surgeon: Lucilla Lame,  MD;  Location: [place];  Service: Endoscopy;  Laterality: N/A;  . POLYPECTOMY  02/25/2017   Procedure: POLYPECTOMY;   Surgeon: Lucilla Lame, MD;  Location: [place];  Service: Endoscopy;;  . ROTATOR CUFF REPAIR Right   . TONSILLECTOMY  1972    Social History   Socioeconomic History  . Marital status: Married    Spouse name: Not on file  . Number of children: Not on file  . Years of education: Not on file  . Highest education level: Not on file  Social Needs  . Financial resource strain: Not on file  . Food insecurity - worry: Not on file  . Food insecurity - inability: Not on file  . Transportation needs - medical: Not on file  . Transportation needs - non-medical: Not on file  Occupational History  . Not on file  Tobacco Use  . Smoking status: Never Smoker  . Smokeless tobacco: Never Used  Substance and Sexual Activity  . Alcohol use: No  . Drug use: No  . Sexual activity: Not Currently  Other Topics Concern  . Not on file  Social History Narrative  . Not on file    Family History  Problem Relation Age of Onset  . Alcohol abuse Maternal Grandfather   . Healthy Mother   . Breast cancer Mother   . Cancer Father   . Healthy Sister   . Healthy Brother   . Breast cancer Maternal Aunt   . Hyperlipidemia Neg Hx   . Heart disease Neg Hx   . Diabetes Neg Hx      Current Outpatient Medications:  .  Cholecalciferol (VITAMIN D3 PO), Take 20,000 mcg by mouth daily., Disp: , Rfl:  .  Menaquinone-7 (VITAMIN K2 PO), Take 200 mcg by mouth daily., Disp: , Rfl:  .  Multiple Vitamin (MULTI-VITAMIN DAILY PO), Take by mouth., Disp: , Rfl:  .  Probiotic Product (PROBIOTIC PO), Take by mouth daily., Disp: , Rfl:   Physical exam:  Vitals:   10/10/17 1521 10/10/17 1523  BP:  128/84  Pulse:  74  Resp:  16  Temp:  98.4 F (36.9 C)  TempSrc:  Tympanic  Weight: 198 lb (89.8 kg)    Physical Exam  Constitutional: She is oriented to person, place, and time and well-developed, well-nourished, and in no distress.  HENT:  Head: Normocephalic and atraumatic.  Eyes: EOM are normal. Pupils  are equal, round, and reactive to light.  Neck: Normal range of motion.  Cardiovascular: Normal rate, regular rhythm and normal heart sounds.  Pulmonary/Chest: Effort normal and breath sounds normal.  Abdominal: Soft. Bowel sounds are normal.  No palpable splenomegaly  Lymphadenopathy:  No palpable cervical, submandibular or supraclavicular axillary or inguinal adenopathy  Neurological: She is alert and oriented to person, place, and time.  Skin: Skin is warm and dry.     CMP Latest Ref Rng & Units 10/10/2017  Glucose 65 - 99 mg/dL 107(H)  BUN 6 - 20 mg/dL 20  Creatinine 0.44 - 1.00 mg/dL 0.75  Sodium 135 - 145 mmol/L 138  Potassium 3.5 - 5.1 mmol/L 3.8  Chloride 101 - 111 mmol/L 102  CO2 22 - 32 mmol/L 27  Calcium 8.9 - 10.3 mg/dL 9.3  Total Protein 6.5 - 8.1 g/dL 7.7  Total Bilirubin 0.3 - 1.2 mg/dL 1.0  Alkaline Phos 38 - 126 U/L 81  AST 15 - 41 U/L 23  ALT 14 - 54 U/L 23   CBC Latest Ref  Rng & Units 10/10/2017  WBC 3.6 - 11.0 K/uL 8.7  Hemoglobin 12.0 - 16.0 g/dL 13.9  Hematocrit 35.0 - 47.0 % 42.4  Platelets 150 - 440 K/uL 282      Assessment and plan- Patient is a 61 y.o. 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  Patient has a significant co-pay for routine scans and I will hold off on getting another scan at this time.  I will see her back in 6 months with CBC CMP and LDH.  She will call us in the interim if she were to develop any B symptoms     Visit Diagnosis 1. Follicular lymphoma grade I of intra-abdominal lymph nodes ([place])      Dr. Randa Evens, MD, MPH [facility] at [facility] Pager- [phone] 10/11/2017 9:32 AM

## 2018-04-10 ENCOUNTER — Inpatient Hospital Stay: Payer: BLUE CROSS/BLUE SHIELD

## 2018-04-10 ENCOUNTER — Inpatient Hospital Stay: Payer: BLUE CROSS/BLUE SHIELD | Admitting: Oncology

## 2018-04-25 ENCOUNTER — Inpatient Hospital Stay: Payer: BLUE CROSS/BLUE SHIELD | Attending: Oncology

## 2018-04-25 DIAGNOSIS — C8203 Follicular lymphoma grade I, intra-abdominal lymph nodes: Secondary | ICD-10-CM | POA: Diagnosis present

## 2018-04-25 DIAGNOSIS — Z90710 Acquired absence of both cervix and uterus: Secondary | ICD-10-CM | POA: Insufficient documentation

## 2018-04-25 DIAGNOSIS — Z79899 Other long term (current) drug therapy: Secondary | ICD-10-CM | POA: Diagnosis not present

## 2018-04-25 DIAGNOSIS — K219 Gastro-esophageal reflux disease without esophagitis: Secondary | ICD-10-CM | POA: Diagnosis not present

## 2018-04-25 DIAGNOSIS — J45909 Unspecified asthma, uncomplicated: Secondary | ICD-10-CM | POA: Diagnosis not present

## 2018-04-25 LAB — CBC WITH DIFFERENTIAL/PLATELET
Basophils Absolute: 0.1 10*3/uL (ref 0–0.1)
Basophils Relative: 1 %
EOS ABS: 0.2 10*3/uL (ref 0–0.7)
Eosinophils Relative: 3 %
HEMATOCRIT: 41 % (ref 35.0–47.0)
HEMOGLOBIN: 14.1 g/dL (ref 12.0–16.0)
LYMPHS ABS: 2 10*3/uL (ref 1.0–3.6)
LYMPHS PCT: 25 %
MCH: 29.8 pg (ref 26.0–34.0)
MCHC: 34.5 g/dL (ref 32.0–36.0)
MCV: 86.4 fL (ref 80.0–100.0)
MONOS PCT: 7 %
Monocytes Absolute: 0.6 10*3/uL (ref 0.2–0.9)
NEUTROS ABS: 5.1 10*3/uL (ref 1.4–6.5)
NEUTROS PCT: 64 %
Platelets: 262 10*3/uL (ref 150–440)
RBC: 4.74 MIL/uL (ref 3.80–5.20)
RDW: 14.1 % (ref 11.5–14.5)
WBC: 8 10*3/uL (ref 3.6–11.0)

## 2018-04-25 LAB — COMPREHENSIVE METABOLIC PANEL
ALK PHOS: 71 U/L (ref 38–126)
ALT: 22 U/L (ref 0–44)
AST: 21 U/L (ref 15–41)
Albumin: 4.4 g/dL (ref 3.5–5.0)
Anion gap: 8 (ref 5–15)
BILIRUBIN TOTAL: 1.3 mg/dL — AB (ref 0.3–1.2)
BUN: 18 mg/dL (ref 6–20)
CALCIUM: 9.7 mg/dL (ref 8.9–10.3)
CO2: 25 mmol/L (ref 22–32)
CREATININE: 0.78 mg/dL (ref 0.44–1.00)
Chloride: 106 mmol/L (ref 98–111)
GFR calc non Af Amer: >60 mL/min (ref >60)
GLUCOSE: 100 mg/dL — AB (ref 70–99)
Potassium: 4.5 mmol/L (ref 3.5–5.1)
SODIUM: 139 mmol/L (ref 135–145)
Total Protein: 7.5 g/dL (ref 6.5–8.1)

## 2018-04-25 LAB — LACTATE DEHYDROGENASE: LDH: 127 U/L (ref 98–192)

## 2018-04-29 ENCOUNTER — Inpatient Hospital Stay: Payer: BLUE CROSS/BLUE SHIELD | Attending: Oncology | Admitting: Oncology

## 2018-04-29 ENCOUNTER — Other Ambulatory Visit: Payer: Self-pay

## 2018-04-29 VITALS — BP 134/84 | HR 87 | Temp 96.7°F | Resp 18 | Wt 211.7 lb

## 2018-04-29 DIAGNOSIS — J45909 Unspecified asthma, uncomplicated: Secondary | ICD-10-CM | POA: Diagnosis not present

## 2018-04-29 DIAGNOSIS — C8203 Follicular lymphoma grade I, intra-abdominal lymph nodes: Secondary | ICD-10-CM | POA: Diagnosis present

## 2018-04-29 DIAGNOSIS — Z08 Encounter for follow-up examination after completed treatment for malignant neoplasm: Secondary | ICD-10-CM | POA: Diagnosis not present

## 2018-04-29 DIAGNOSIS — Z79899 Other long term (current) drug therapy: Secondary | ICD-10-CM | POA: Diagnosis not present

## 2018-04-29 DIAGNOSIS — Z8572 Personal history of non-Hodgkin lymphomas: Secondary | ICD-10-CM

## 2018-04-29 DIAGNOSIS — K219 Gastro-esophageal reflux disease without esophagitis: Secondary | ICD-10-CM | POA: Diagnosis not present

## 2018-04-29 DIAGNOSIS — Z90710 Acquired absence of both cervix and uterus: Secondary | ICD-10-CM | POA: Diagnosis not present

## 2018-04-29 NOTE — Progress Notes (Signed)
Here for follow up. Stated she is feeling well.

## 2018-05-02 ENCOUNTER — Encounter: Payer: Self-pay | Admitting: Oncology

## 2018-05-02 NOTE — Progress Notes (Signed)
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   2. This was followed by a CT of the abdomen which showed: Multiple enlarged abdominal and left common iliac nodes. Findings are worrisome for either metastatic adenopathy from a abdominal primary versus lymphoproliferative disorder. Consider further investigation with PET-CT and tissue sampling.  3. PET/CT scan from 01/08/2017 showed:Hypermetabolic upper abdominal and retroperitoneal lymph nodes, measuring up to 1.9 cm short axis, worrisome for nodal metastases versus lymphoma.  Hypermetabolic 7 mm short axis node at the left thoracic inlet, worrisome for lymphatic spread via the thoracic duct.  4. Tumor markers including CEA and CA-19-9 was normal at 1 and 8 respectively.  5. She is otherwise doing well and denies any complaints of unintentional weight loss, loss of appetite, fevers or chills or drenching night sweats. Her abdominal pain is mostly associated with eating and sometimes comes on a day later. It is mainly in her left lower quadrant and is a dull aching pain but sometimes she seems to have pain in the epigastrium and right upper quadrant  6. Patient underwent IR guided retroperitoneal LN biopsy which showed : DIAGNOSIS:  A. LYMPH NODE, LEFT RETROPERITONEAL; CT-GUIDED  BIOPSY:  - FOLLICULAR LYMPHOMA, GRADE 1-2.  7. Bone marrow biopsy negative for lymphoma. Normal karyotype and cytogenetics  She remains under observation and has not required any treatment so far  Interval history- she feels well. Denies any fatigue, night sweats, unintentional weight loss or aches and pains anywhere. Her appetite is good. She denies any complaints today  ECOG PS- 0 Pain scale- 0   Review of systems- Review of Systems  Constitutional: Negative for chills, fever, malaise/fatigue and weight loss.  HENT: Negative for congestion, ear discharge and nosebleeds.   Eyes: Negative for blurred vision.  Respiratory: Negative for cough, hemoptysis, sputum production, shortness of breath and wheezing.   Cardiovascular: Negative for chest pain, palpitations, orthopnea and claudication.  Gastrointestinal: Negative for abdominal pain, blood in stool, constipation, diarrhea, heartburn, melena, nausea and vomiting.  Genitourinary: Negative for dysuria, flank pain, frequency, hematuria and urgency.  Musculoskeletal: Negative for back pain, joint pain and myalgias.  Skin: Negative for rash.  Neurological: Negative for dizziness, tingling, focal weakness, seizures, weakness and headaches.  Endo/Heme/Allergies: Does not bruise/bleed easily.  Psychiatric/Behavioral: Negative for depression and suicidal ideas. The patient does not have insomnia.     Allergies  Allergen Reactions  . Adhesive [Tape] Rash    Bandaids     Past Medical History:  Diagnosis Date  . Asthma   . Family history of adverse reaction to anesthesia    Father - slow to wake  . GERD (gastroesophageal reflux disease)   . Gravida 4 para 4      Past Surgical History:  Procedure Laterality Date  . ABDOMINAL HYSTERECTOMY     06 01 2013  . BLADDER SURGERY    . COLONOSCOPY WITH PROPOFOL  N/A 02/25/2017   Procedure: COLONOSCOPY WITH PROPOFOL;  Surgeon: Lucilla Lame, MD;  Location: [place];  Service:  Endoscopy;  Laterality: N/A;  . POLYPECTOMY  02/25/2017   Procedure: POLYPECTOMY;  Surgeon: Lucilla Lame, MD;  Location: [place];  Service: Endoscopy;;  . ROTATOR CUFF REPAIR Right   . TONSILLECTOMY  1972    Social History   Socioeconomic History  . Marital status: Married    Spouse name: Not on file  . Number of children: Not on file  . Years of education: Not on file  . Highest education level: Not on file  Occupational History  . Not on file  Social Needs  . Financial resource strain: Not on file  . Food insecurity:    Worry: Not on file    Inability: Not on file  . Transportation needs:    Medical: Not on file    Non-medical: Not on file  Tobacco Use  . Smoking status: Never Smoker  . Smokeless tobacco: Never Used  Substance and Sexual Activity  . Alcohol use: No  . Drug use: No  . Sexual activity: Not Currently  Lifestyle  . Physical activity:    Days per week: Not on file    Minutes per session: Not on file  . Stress: Not on file  Relationships  . Social connections:    Talks on phone: Not on file    Gets together: Not on file    Attends religious service: Not on file    Active member of club or organization: Not on file    Attends meetings of clubs or organizations: Not on file    Relationship status: Not on file  . Intimate partner violence:    Fear of current or ex partner: Not on file    Emotionally abused: Not on file    Physically abused: Not on file    Forced sexual activity: Not on file  Other Topics Concern  . Not on file  Social History Narrative  . Not on file    Family History  Problem Relation Age of Onset  . Alcohol abuse Maternal Grandfather   . Healthy Mother   . Breast cancer Mother   . Cancer Father   . Healthy Sister   . Healthy Brother   . Breast cancer Maternal Aunt   . Hyperlipidemia Neg Hx   . Heart disease Neg Hx   . Diabetes Neg Hx      Current Outpatient Medications:  .  albuterol (PROVENTIL HFA) 108 (90  Base) MCG/ACT inhaler, Inhale into the lungs., Disp: , Rfl:  .  Cholecalciferol (VITAMIN D3 PO), Take 20,000 mcg by mouth daily., Disp: , Rfl:  .  Menaquinone-7 (VITAMIN K2 PO), Take 200 mcg by mouth daily., Disp: , Rfl:  .  Multiple Vitamin (MULTI-VITAMIN DAILY PO), Take by mouth., Disp: , Rfl:  .  Probiotic Product (PROBIOTIC PO), Take by mouth daily., Disp: , Rfl:   Physical exam:  Vitals:   04/29/18 1458  BP: 134/84  Pulse: 87  Resp: 18  Temp: (!) 96.7 F (35.9 C)  TempSrc: Tympanic  Weight: 211 lb 11.2 oz (96 kg)   Physical Exam  Constitutional: She is oriented to person, place, and time. She appears well-developed and well-nourished.  HENT:  Head: Normocephalic and atraumatic.  Eyes: Pupils are equal, round, and reactive to light. EOM are normal.  Neck: Normal range of motion.  Cardiovascular: Normal rate, regular rhythm and normal heart sounds.  Pulmonary/Chest:  Effort normal and breath sounds normal.  Abdominal: Soft. Bowel sounds are normal.  Lymphadenopathy:  No palpable cervical, supraclavicular, axillary or inguinal adenopathy   Neurological: She is alert and oriented to person, place, and time.  Skin: Skin is warm and dry.     CMP Latest Ref Rng & Units 04/25/2018  Glucose 70 - 99 mg/dL 100(H)  BUN 6 - 20 mg/dL 18  Creatinine 0.44 - 1.00 mg/dL 0.78  Sodium 135 - 145 mmol/L 139  Potassium 3.5 - 5.1 mmol/L 4.5  Chloride 98 - 111 mmol/L 106  CO2 22 - 32 mmol/L 25  Calcium 8.9 - 10.3 mg/dL 9.7  Total Protein 6.5 - 8.1 g/dL 7.5  Total Bilirubin 0.3 - 1.2 mg/dL 1.3(H)  Alkaline Phos 38 - 126 U/L 71  AST 15 - 41 U/L 21  ALT 0 - 44 U/L 22   CBC Latest Ref Rng & Units 04/25/2018  WBC 3.6 - 11.0 K/uL 8.0  Hemoglobin 12.0 - 16.0 g/dL 14.1  Hematocrit 35.0 - 47.0 % 41.0  Platelets 150 - 440 K/uL 262     Assessment and plan- Patient is a 62 y.o. 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 She is here for routine  f/u  Overall patient is doing well. She has no B symptoms. No palpable adenopathy. Cbc is wnl. Will hold off on surveillance scans at this time. I will see her back in 1  Year with labs. Consider doing scans at that time     Visit Diagnosis 1. Follicular lymphoma grade i, intra-abdominal lymph nodes ([place])   2. Encounter for follow-up surveillance of lymphoma      Dr. Randa Evens, MD, MPH [facility] at [facility] [phone] 05/02/2018 8:11 AM

## 2019-03-28 ENCOUNTER — Encounter
Admission: EM | Disposition: A | Discharge status: Home / Self Care | Payer: Self-pay | Source: Home / Self Care | Attending: Emergency Medicine

## 2019-03-28 ENCOUNTER — Other Ambulatory Visit: Payer: Self-pay

## 2019-03-28 ENCOUNTER — Observation Stay
Admission: EM | Admit: 2019-03-28 | Discharge: 2019-03-29 | Disposition: A | Discharge status: Home / Self Care | Payer: BLUE CROSS/BLUE SHIELD | Attending: Specialist | Admitting: Specialist

## 2019-03-28 ENCOUNTER — Inpatient Hospital Stay: Payer: BLUE CROSS/BLUE SHIELD

## 2019-03-28 ENCOUNTER — Emergency Department: Payer: BLUE CROSS/BLUE SHIELD

## 2019-03-28 ENCOUNTER — Encounter: Payer: Self-pay | Admitting: Emergency Medicine

## 2019-03-28 ENCOUNTER — Inpatient Hospital Stay: Payer: BLUE CROSS/BLUE SHIELD | Admitting: Anesthesiology

## 2019-03-28 DIAGNOSIS — Z79899 Other long term (current) drug therapy: Secondary | ICD-10-CM | POA: Diagnosis not present

## 2019-03-28 DIAGNOSIS — N135 Crossing vessel and stricture of ureter without hydronephrosis: Secondary | ICD-10-CM

## 2019-03-28 DIAGNOSIS — N131 Hydronephrosis with ureteral stricture, not elsewhere classified: Secondary | ICD-10-CM | POA: Diagnosis not present

## 2019-03-28 DIAGNOSIS — C8213 Follicular lymphoma grade II, intra-abdominal lymph nodes: Secondary | ICD-10-CM | POA: Diagnosis not present

## 2019-03-28 DIAGNOSIS — N1330 Unspecified hydronephrosis: Secondary | ICD-10-CM

## 2019-03-28 DIAGNOSIS — C8593 Non-Hodgkin lymphoma, unspecified, intra-abdominal lymph nodes: Secondary | ICD-10-CM | POA: Insufficient documentation

## 2019-03-28 DIAGNOSIS — E790 Hyperuricemia without signs of inflammatory arthritis and tophaceous disease: Secondary | ICD-10-CM | POA: Diagnosis not present

## 2019-03-28 DIAGNOSIS — R1900 Intra-abdominal and pelvic swelling, mass and lump, unspecified site: Secondary | ICD-10-CM

## 2019-03-28 DIAGNOSIS — C8293 Follicular lymphoma, unspecified, intra-abdominal lymph nodes: Secondary | ICD-10-CM

## 2019-03-28 DIAGNOSIS — Z1159 Encounter for screening for other viral diseases: Secondary | ICD-10-CM | POA: Insufficient documentation

## 2019-03-28 DIAGNOSIS — J45909 Unspecified asthma, uncomplicated: Secondary | ICD-10-CM | POA: Insufficient documentation

## 2019-03-28 HISTORY — PX: CYSTOSCOPY W/ URETERAL STENT PLACEMENT: SHX1429

## 2019-03-28 LAB — URINALYSIS, COMPLETE (UACMP) WITH MICROSCOPIC
Bilirubin Urine: NEGATIVE
Glucose, UA: 50 mg/dL — AB
Ketones, ur: 20 mg/dL — AB
Nitrite: NEGATIVE
Protein, ur: 30 mg/dL — AB
RBC / HPF: >50 RBC/hpf — ABNORMAL HIGH (ref 0–5)
Specific Gravity, Urine: 1.015 (ref 1.005–1.030)
pH: 6 (ref 5.0–8.0)

## 2019-03-28 LAB — COMPREHENSIVE METABOLIC PANEL
ALT: 29 U/L (ref 0–44)
AST: 27 U/L (ref 15–41)
Albumin: 4.7 g/dL (ref 3.5–5.0)
Alkaline Phosphatase: 73 U/L (ref 38–126)
Anion gap: 11 (ref 5–15)
BUN: 8 mg/dL (ref 8–23)
CO2: 26 mmol/L (ref 22–32)
Calcium: 9.5 mg/dL (ref 8.9–10.3)
Chloride: 104 mmol/L (ref 98–111)
Creatinine, Ser: 0.79 mg/dL (ref 0.44–1.00)
GFR calc Af Amer: >60 mL/min (ref >60)
GFR calc non Af Amer: >60 mL/min (ref >60)
Glucose, Bld: 96 mg/dL (ref 70–99)
Potassium: 4 mmol/L (ref 3.5–5.1)
Sodium: 141 mmol/L (ref 135–145)
Total Bilirubin: 1.5 mg/dL — ABNORMAL HIGH (ref 0.3–1.2)
Total Protein: 7.9 g/dL (ref 6.5–8.1)

## 2019-03-28 LAB — CBC
HCT: 45.5 % (ref 36.0–46.0)
Hemoglobin: 14.6 g/dL (ref 12.0–15.0)
MCH: 28.1 pg (ref 26.0–34.0)
MCHC: 32.1 g/dL (ref 30.0–36.0)
MCV: 87.7 fL (ref 80.0–100.0)
Platelets: 274 10*3/uL (ref 150–400)
RBC: 5.19 MIL/uL — ABNORMAL HIGH (ref 3.87–5.11)
RDW: 13.4 % (ref 11.5–15.5)
WBC: 7.6 10*3/uL (ref 4.0–10.5)
nRBC: 0 % (ref 0.0–0.2)

## 2019-03-28 LAB — SARS CORONAVIRUS 2 BY RT PCR (HOSPITAL ORDER, PERFORMED IN ~~LOC~~ HOSPITAL LAB): SARS Coronavirus 2: NEGATIVE

## 2019-03-28 LAB — LIPASE, BLOOD: Lipase: 27 U/L (ref 11–51)

## 2019-03-28 LAB — LACTATE DEHYDROGENASE: LDH: 168 U/L (ref 98–192)

## 2019-03-28 LAB — TROPONIN I: Troponin I: <0.03 ng/mL (ref <0.03)

## 2019-03-28 LAB — URIC ACID: Uric Acid, Serum: 9.4 mg/dL — ABNORMAL HIGH (ref 2.5–7.1)

## 2019-03-28 SURGERY — CYSTOSCOPY, WITH RETROGRADE PYELOGRAM AND URETERAL STENT INSERTION
Anesthesia: General | Laterality: Left

## 2019-03-28 MED ORDER — SODIUM CHLORIDE 0.9% FLUSH: 3.0000 mL | Freq: Once | INTRAVENOUS | Status: DC

## 2019-03-28 MED ORDER — GLYCOPYRROLATE 0.2 MG/ML IJ SOLN
INTRAMUSCULAR | Status: AC
  Filled 2019-03-28: qty 1

## 2019-03-28 MED ORDER — PROMETHAZINE HCL 25 MG/ML IJ SOLN: 6.2500 mg | INTRAMUSCULAR | Status: DC | PRN

## 2019-03-28 MED ORDER — IOHEXOL 180 MG/ML  SOLN
INTRAMUSCULAR | Status: DC | PRN
  Administered 2019-03-28: 8 mL via INTRAVENOUS

## 2019-03-28 MED ORDER — SODIUM CHLORIDE 0.9 % IV SOLN
INTRAVENOUS | Status: DC
  Administered 2019-03-28 – 2019-03-29 (×2): via INTRAVENOUS

## 2019-03-28 MED ORDER — ONDANSETRON HCL 4 MG/2ML IJ SOLN
4.0000 mg | Freq: Once | INTRAMUSCULAR | Status: AC
  Administered 2019-03-28: 4 mg via INTRAVENOUS
  Filled 2019-03-28: qty 2

## 2019-03-28 MED ORDER — MIDAZOLAM HCL 2 MG/2ML IJ SOLN
INTRAMUSCULAR | Status: AC
  Filled 2019-03-28: qty 2

## 2019-03-28 MED ORDER — ONDANSETRON HCL 4 MG/2ML IJ SOLN: 4.0000 mg | Freq: Four times a day (QID) | INTRAMUSCULAR | Status: DC | PRN

## 2019-03-28 MED ORDER — PHENYLEPHRINE HCL (PRESSORS) 10 MG/ML IV SOLN
INTRAVENOUS | Status: DC | PRN
  Administered 2019-03-28 (×2): 100 ug via INTRAVENOUS

## 2019-03-28 MED ORDER — PROPOFOL 10 MG/ML IV BOLUS
INTRAVENOUS | Status: AC
  Filled 2019-03-28: qty 20

## 2019-03-28 MED ORDER — ALLOPURINOL 100 MG PO TABS
100.0000 mg | ORAL_TABLET | Freq: Every day | ORAL | Status: DC
  Administered 2019-03-29: 08:00:00 100 mg via ORAL
  Filled 2019-03-28: qty 1

## 2019-03-28 MED ORDER — IOHEXOL 300 MG/ML  SOLN
100.0000 mL | Freq: Once | INTRAMUSCULAR | Status: AC | PRN
  Administered 2019-03-28: 10:00:00 100 mL via INTRAVENOUS

## 2019-03-28 MED ORDER — CEFAZOLIN SODIUM 1 G IJ SOLR
INTRAMUSCULAR | Status: AC
  Filled 2019-03-28: qty 20

## 2019-03-28 MED ORDER — ONDANSETRON HCL 4 MG/2ML IJ SOLN
INTRAMUSCULAR | Status: DC | PRN
  Administered 2019-03-28: 4 mg via INTRAVENOUS

## 2019-03-28 MED ORDER — DEXAMETHASONE SODIUM PHOSPHATE 10 MG/ML IJ SOLN
INTRAMUSCULAR | Status: AC
  Filled 2019-03-28: qty 1

## 2019-03-28 MED ORDER — ACETAMINOPHEN 650 MG RE SUPP: 650.0000 mg | Freq: Four times a day (QID) | RECTAL | Status: DC | PRN

## 2019-03-28 MED ORDER — KETOROLAC TROMETHAMINE 30 MG/ML IJ SOLN
30.0000 mg | Freq: Four times a day (QID) | INTRAMUSCULAR | Status: DC | PRN
  Administered 2019-03-28 – 2019-03-29 (×3): 30 mg via INTRAVENOUS
  Filled 2019-03-28 (×3): qty 1

## 2019-03-28 MED ORDER — IOHEXOL 240 MG/ML SOLN
50.0000 mL | Freq: Once | INTRAMUSCULAR | Status: AC
  Administered 2019-03-28: 09:00:00 50 mL via ORAL

## 2019-03-28 MED ORDER — ALBUTEROL SULFATE (2.5 MG/3ML) 0.083% IN NEBU
2.5000 mg | INHALATION_SOLUTION | Freq: Four times a day (QID) | RESPIRATORY_TRACT | Status: DC | PRN
  Filled 2019-03-28: qty 3

## 2019-03-28 MED ORDER — ACETAMINOPHEN 325 MG PO TABS
650.0000 mg | ORAL_TABLET | Freq: Four times a day (QID) | ORAL | Status: DC | PRN
  Administered 2019-03-28: 20:00:00 650 mg via ORAL
  Filled 2019-03-28: qty 2

## 2019-03-28 MED ORDER — ADULT MULTIVITAMIN W/MINERALS CH
1.0000 | ORAL_TABLET | Freq: Every day | ORAL | Status: DC
  Administered 2019-03-29: 1 via ORAL
  Filled 2019-03-28 (×2): qty 1

## 2019-03-28 MED ORDER — ONDANSETRON HCL 4 MG PO TABS: 4.0000 mg | ORAL_TABLET | Freq: Four times a day (QID) | ORAL | Status: DC | PRN

## 2019-03-28 MED ORDER — LIDOCAINE HCL (PF) 2 % IJ SOLN
INTRAMUSCULAR | Status: AC
  Filled 2019-03-28: qty 10

## 2019-03-28 MED ORDER — MORPHINE SULFATE (PF) 2 MG/ML IV SOLN
2.0000 mg | Freq: Once | INTRAVENOUS | Status: AC
  Administered 2019-03-28: 09:00:00 2 mg via INTRAVENOUS
  Filled 2019-03-28: qty 1

## 2019-03-28 MED ORDER — FENTANYL CITRATE (PF) 100 MCG/2ML IJ SOLN
INTRAMUSCULAR | Status: DC | PRN
  Administered 2019-03-28: 25 ug via INTRAVENOUS
  Administered 2019-03-28: 50 ug via INTRAVENOUS
  Administered 2019-03-28: 25 ug via INTRAVENOUS

## 2019-03-28 MED ORDER — MORPHINE SULFATE (PF) 2 MG/ML IV SOLN
2.0000 mg | Freq: Once | INTRAVENOUS | Status: AC
  Administered 2019-03-28: 12:00:00 2 mg via INTRAVENOUS
  Filled 2019-03-28: qty 1

## 2019-03-28 MED ORDER — OXYBUTYNIN CHLORIDE 5 MG PO TABS
5.0000 mg | ORAL_TABLET | Freq: Three times a day (TID) | ORAL | Status: DC | PRN
  Administered 2019-03-29: 11:00:00 5 mg via ORAL
  Filled 2019-03-28: qty 1

## 2019-03-28 MED ORDER — LACTATED RINGERS IV SOLN
INTRAVENOUS | Status: DC | PRN
  Administered 2019-03-28: 14:00:00 via INTRAVENOUS

## 2019-03-28 MED ORDER — LIDOCAINE HCL (PF) 2 % IJ SOLN
INTRAMUSCULAR | Status: DC | PRN
  Administered 2019-03-28: 50 mg

## 2019-03-28 MED ORDER — FENTANYL CITRATE (PF) 100 MCG/2ML IJ SOLN
INTRAMUSCULAR | Status: AC
  Filled 2019-03-28: qty 2

## 2019-03-28 MED ORDER — CEFAZOLIN SODIUM-DEXTROSE 2-3 GM-%(50ML) IV SOLR
INTRAVENOUS | Status: DC | PRN
  Administered 2019-03-28: 2 g via INTRAVENOUS

## 2019-03-28 MED ORDER — PROPOFOL 10 MG/ML IV BOLUS
INTRAVENOUS | Status: DC | PRN
  Administered 2019-03-28: 40 mg via INTRAVENOUS
  Administered 2019-03-28: 160 mg via INTRAVENOUS

## 2019-03-28 MED ORDER — GLYCOPYRROLATE 0.2 MG/ML IJ SOLN
INTRAMUSCULAR | Status: DC | PRN
  Administered 2019-03-28: 0.2 mg via INTRAVENOUS

## 2019-03-28 MED ORDER — DEXAMETHASONE SODIUM PHOSPHATE 10 MG/ML IJ SOLN
INTRAMUSCULAR | Status: DC | PRN
  Administered 2019-03-28: 10 mg via INTRAVENOUS

## 2019-03-28 MED ORDER — MORPHINE SULFATE (PF) 2 MG/ML IV SOLN
2.0000 mg | INTRAVENOUS | Status: DC | PRN
  Administered 2019-03-28 – 2019-03-29 (×2): 2 mg via INTRAVENOUS
  Filled 2019-03-28 (×2): qty 1

## 2019-03-28 MED ORDER — ONDANSETRON HCL 4 MG/2ML IJ SOLN
INTRAMUSCULAR | Status: AC
  Filled 2019-03-28: qty 2

## 2019-03-28 MED ORDER — MIDAZOLAM HCL 5 MG/5ML IJ SOLN
INTRAMUSCULAR | Status: DC | PRN
  Administered 2019-03-28: 2 mg via INTRAVENOUS

## 2019-03-28 MED ORDER — FENTANYL CITRATE (PF) 100 MCG/2ML IJ SOLN: 25.0000 ug | INTRAMUSCULAR | Status: DC | PRN

## 2019-03-28 MED ORDER — ENOXAPARIN SODIUM 40 MG/0.4ML ~~LOC~~ SOLN
40.0000 mg | SUBCUTANEOUS | Status: DC
  Administered 2019-03-29: 08:00:00 40 mg via SUBCUTANEOUS
  Filled 2019-03-28: qty 0.4

## 2019-03-28 SURGICAL SUPPLY — 19 items
CATH URETL 5X70 OPEN END (CATHETERS) ×3 IMPLANT
CONRAY 43 FOR UROLOGY 50M (MISCELLANEOUS) IMPLANT
COVER WAND RF STERILE (DRAPES) ×3 IMPLANT
GLOVE BIO SURGEON STRL SZ8 (GLOVE) ×9 IMPLANT
GOWN STRL REUS W/ TWL XL LVL3 (GOWN DISPOSABLE) ×1 IMPLANT
GOWN STRL REUS W/TWL XL LVL3 (GOWN DISPOSABLE) ×2
GUIDEWIRE STR DUAL SENSOR (WIRE) ×3 IMPLANT
KIT TURNOVER CYSTO (KITS) ×3 IMPLANT
OMNIPAQUE 300 100ML (MISCELLANEOUS) IMPLANT
PACK CYSTO AR (MISCELLANEOUS) ×3 IMPLANT
SET CYSTO W/LG BORE CLAMP LF (SET/KITS/TRAYS/PACK) ×3 IMPLANT
STENT CONTOUR NO GW 8FR 26CM (STENTS) ×3 IMPLANT
STENT URET 6FRX24 CONTOUR (STENTS) IMPLANT
STENT URET 6FRX26 CONTOUR (STENTS) IMPLANT
SURGILUBE 2OZ TUBE FLIPTOP (MISCELLANEOUS) ×3 IMPLANT
SYR 10ML LL (SYRINGE) ×3 IMPLANT
WATER STERILE IRR 1000ML POUR (IV SOLUTION) ×3 IMPLANT
WATER STERILE IRR 3000ML UROMA (IV SOLUTION) ×3 IMPLANT
iohexal ×3 IMPLANT

## 2019-03-28 NOTE — Anesthesia Post-op Follow-up Note (Signed)
Anesthesia QCDR form completed.        

## 2019-03-28 NOTE — Consult Note (Signed)
Urology Consult  Chief Complaint: Abdominal pain  History of Present Illness: Andrea Lloyd is a 63 y.o. year old seen in consultation at the request of Dr. Jacqualine Code for evaluation of left hydronephrosis.  She presented to the ED this morning with an 8-day history of progressively worsening left upper quadrant abdominal pain.  Her pain was initially dull and intermittent and has progressively worsened over the last 3 days.  There were no identifiable precipitating, aggravating or alleviating factors.  The intensity is presently described as severe.  There is radiation of the pain to the left flank region.  She has nausea without vomiting.  Denies fever or chills.  She had a telehealth visit approximately 2 days ago and was started on Cipro and Flagyl for possible diverticulitis.  CT of the abdomen pelvis with contrast was performed in the ED which shows extensive retroperitoneal adenopathy and moderate left hydronephrosis.  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  She was last seen by oncology and July 2019 with observation elected.  She was scheduled for 1 year follow-up.  There has been significant progression of her adenopathy compared with her CT of 2018.  Past Medical History:  Diagnosis Date   Asthma    Family history of adverse reaction to anesthesia    Father - slow to wake   GERD (gastroesophageal reflux disease)    Gravida 4 para 4     Past Surgical History:  Procedure Laterality Date   ABDOMINAL HYSTERECTOMY     06 01 2013   BLADDER SURGERY     COLONOSCOPY WITH PROPOFOL N/A 02/25/2017   Procedure: COLONOSCOPY WITH PROPOFOL;  Surgeon: Lucilla Lame, MD;  Location: [facility];  Service: Endoscopy;  Laterality: N/A;   POLYPECTOMY  02/25/2017   Procedure: POLYPECTOMY;  Surgeon: Lucilla Lame, MD;   Location: [facility];  Service: Endoscopy;;   ROTATOR CUFF REPAIR Right    TONSILLECTOMY  1972    Home Medications:  Current Meds  Medication Sig   Cholecalciferol (VITAMIN D3 PO) Take 20,000 mcg by mouth daily.   ciprofloxacin (CIPRO) 500 MG tablet Take 500 mg by mouth 2 (two) times a day.   Menaquinone-7 (VITAMIN K2 PO) Take 200 mcg by mouth daily.   metroNIDAZOLE (FLAGYL) 500 MG tablet Take 500 mg by mouth 2 (two) times a day.   Multiple Vitamin (MULTI-VITAMIN DAILY PO) Take by mouth.   Probiotic Product (PROBIOTIC PO) Take by mouth daily.    Allergies:  Allergies  Allergen Reactions   Adhesive [Tape] Rash    Bandaids    Family History  Problem Relation Age of Onset   Alcohol abuse Maternal Grandfather    Healthy Mother    Cancer Father    Healthy Sister    Healthy Brother    Breast cancer Maternal Aunt    Hyperlipidemia Neg Hx    Heart disease Neg Hx    Diabetes Neg Hx     Social History:  reports that she has never smoked. She has never used smokeless tobacco. She reports that she does not drink alcohol or use drugs.  ROS: A complete review of systems was performed.  All systems are negative except for pertinent findings as noted.  Physical Exam:  Vital signs in last 24 hours: Temp:  [98.1 F (36.7 C)] 98.1 F (36.7 C) (05/30 0840)  Pulse Rate:  [80-103] 80 (05/30 1300) Resp:  [20] 20 (05/30 0840) BP: (122-147)/(89-94) 128/91 (05/30 1300) SpO2:  [93 %-97 %] 94 % (05/30 1300) Weight:  [82.6 kg] 82.6 kg (05/30 0841) Constitutional:  Alert and oriented, No acute distress HEENT: [place] AT, moist mucus membranes.  Trachea midline, no masses Cardiovascular: Regular rate and rhythm, no clubbing, cyanosis, or edema. Respiratory: Normal respiratory effort, lungs clear bilaterally GI: Abdomen left upper quadrant tenderness without peritoneal signs GU: No CVA tenderness Skin: No rashes, bruises or suspicious lesions Lymph: No cervical or  inguinal adenopathy Neurologic: Grossly intact, no focal deficits, moving all 4 extremities Psychiatric: Normal mood and affect   Laboratory Data:  Recent Labs    03/28/19 0905  WBC 7.6  HGB 14.6  HCT 45.5   Recent Labs    03/28/19 0905  NA 141  K 4.0  CL 104  CO2 26  GLUCOSE 96  BUN 8  CREATININE 0.79  CALCIUM 9.5   No results for input(s): LABPT, INR in the last 72 hours. No results for input(s): LABURIN in the last 72 hours. Results for orders placed or performed during the hospital encounter of 03/28/19  SARS Coronavirus 2 (CEPHEID - Performed in [facility] hospital lab), Hosp Order     Status: None   Collection Time: 03/28/19 12:14 PM  Result Value Ref Range Status   SARS Coronavirus 2 NEGATIVE NEGATIVE Final    Comment: (NOTE) If result is NEGATIVE SARS-CoV-2 target nucleic acids are NOT DETECTED. The SARS-CoV-2 RNA is generally detectable in upper and lower  respiratory specimens during the acute phase of infection. The lowest  concentration of SARS-CoV-2 viral copies this assay can detect is 250  copies / mL. A negative result does not preclude SARS-CoV-2 infection  and should not be used as the sole basis for treatment or other  patient management decisions.  A negative result may occur with  improper specimen collection / handling, submission of specimen other  than nasopharyngeal swab, presence of viral mutation(s) within the  areas targeted by this assay, and inadequate number of viral copies  (<250 copies / mL). A negative result must be combined with clinical  observations, patient history, and epidemiological information. If result is POSITIVE SARS-CoV-2 target nucleic acids are DETECTED. The SARS-CoV-2 RNA is generally detectable in upper and lower  respiratory specimens dur ing the acute phase of infection.  Positive  results are indicative of active infection with SARS-CoV-2.  Clinical  correlation with patient history and other diagnostic  information is  necessary to determine patient infection status.  Positive results do  not rule out bacterial infection or co-infection with other viruses. If result is PRESUMPTIVE POSTIVE SARS-CoV-2 nucleic acids MAY BE PRESENT.   A presumptive positive result was obtained on the submitted specimen  and confirmed on repeat testing.  While 2019 novel coronavirus  (SARS-CoV-2) nucleic acids may be present in the submitted sample  additional confirmatory testing may be necessary for epidemiological  and / or clinical management purposes  to differentiate between  SARS-CoV-2 and other Sarbecovirus currently known to infect humans.  If clinically indicated additional testing with an alternate test  methodology [phone]) is advised. The SARS-CoV-2 RNA is generally  detectable in upper and lower respiratory sp ecimens during the acute  phase of infection. The expected result is Negative. Fact Sheet for Patients:  StrictlyIdeas.no Fact Sheet for Healthcare Providers: BankingDealers.co.za This test is not yet approved or cleared by the [place] FDA and has  been authorized for detection and/or diagnosis of SARS-CoV-2 by FDA under an Emergency Use Authorization (EUA).  This EUA will remain in effect (meaning this test can be used) for the duration of the COVID-19 declaration under Section 564(b)(1) of the Act, 21 U.S.C. section 360bbb-3(b)(1), unless the authorization is terminated or revoked sooner. Performed at [facility], [address], [place], [place] [address]      Radiologic Imaging: CT images were personally reviewed Ct Abdomen Pelvis W Contrast  Result Date: 03/28/2019 CLINICAL DATA:  Left lower quadrant abdominal pain and peritoneal signs on exam. Prior hysterectomy. History of follicular lymphoma. EXAM: CT ABDOMEN AND PELVIS WITH CONTRAST TECHNIQUE: Multidetector CT imaging of the abdomen and pelvis was performed  using the standard protocol following bolus administration of intravenous contrast. CONTRAST:  173mL OMNIPAQUE IOHEXOL 300 MG/ML  SOLN COMPARISON:  12/31/2016 CT abdomen/pelvis and 01/08/2017 PET-CT. FINDINGS: Lower chest: Trace dependent left pleural effusion. New bilateral retrocrural adenopathy measuring up to 2.2 cm (series 2/image 23). Hepatobiliary: Normal liver size. No liver mass. Distended gallbladder. No gallbladder wall thickening. No radiopaque cholelithiasis. Minimal central intrahepatic biliary ductal dilatation, unchanged. CBD diameter 7 mm, slightly dilated, unchanged. No radiopaque choledocholithiasis. Pancreas: Normal, with no mass or duct dilation. Spleen: Normal size. No mass. Adrenals/Urinary Tract: Normal adrenals. New moderate left hydronephrosis with mild left perinephric edema. No right hydronephrosis. No renal masses. Normal bladder. Stomach/Bowel: Normal non-distended stomach. Normal caliber small bowel with no small bowel wall thickening. Normal appendix. Scattered mild colonic diverticulosis, with no large bowel wall thickening or significant pericolonic fat stranding. Vascular/Lymphatic: Atherosclerotic nonaneurysmal abdominal aorta. Patent portal, splenic, hepatic and renal veins. Massive confluent retroperitoneal adenopathy throughout pericaval, aortocaval and left periaortic chains, significantly increased from prior. Representative 7.3 cm short axis left para-aortic node (series 2/image 36), increased from 1.6 cm. Representative short axis 5.5 cm aortocaval node (series 2/image 32), increased from 2.1 cm. Reproductive: Status post hysterectomy, with no abnormal findings at the vaginal cuff. No adnexal mass. Other: No ascites. No pneumoperitoneum. Large subcutaneous 8.1 x 4.3 cm collection in the left gluteal region with thick wall (series 2/image 66), new from prior. Musculoskeletal: No aggressive appearing focal osseous lesions. Mild thoracolumbar spondylosis. IMPRESSION: 1. Marked  confluent retroperitoneal and retrocaval adenopathy, substantially progressed since 12/31/2016 CT study, compatible with progression of the patient's biopsy proven follicular lymphoma. 2. New moderate left hydronephrosis due to extrinsic obstruction from the progressive adenopathy. 3. Trace dependent left pleural effusion. 4. Mild colonic diverticulosis, with no evidence of acute diverticulitis. No evidence of bowel obstruction. 5. Large subcutaneous collection in the left gluteal region with thick wall, nonspecific, potentially posttraumatic hematoma. Infected collection cannot be excluded. 6.  Aortic Atherosclerosis (ICD10-I70.0). Electronically Signed   By: Ilona Sorrel M.D.   On: 03/28/2019 10:40    Impression/Assessment:  63 year old female with extensive retroperitoneal adenopathy secondary to follicular lymphoma with left hydronephrosis consistent with extrinsic obstruction secondary to adenopathy.  Her left-sided pain is most likely secondary to hydronephrosis.  Plan:  Management options were discussed with Ms. Alleman including percutaneous nephrostomy and left ureteral stent placement.  The pros and cons of each option were discussed in detail.  She has initially elected placement of a left ureteral stent which is reasonable.  We discussed possibility of continued obstruction due to her extensive adenopathy and if stenting does not provide adequate drainage she would need nephrostomy.  The procedure was cussed in detail including potential risks of bleeding, infection/sepsis, ureteral injury and inability to place stent secondary  to obstruction.  She indicated all questions were answered to her satisfaction and desires to proceed.   03/28/2019, 1:29 PM  John Giovanni,  MD

## 2019-03-28 NOTE — Progress Notes (Signed)
15 minute call to floor. 

## 2019-03-28 NOTE — Anesthesia Postprocedure Evaluation (Signed)
Anesthesia Post Note  Patient: Andrea Lloyd  Procedure(s) Performed: CYSTOSCOPY WITH RETROGRADE PYELOGRAM/URETERAL STENT PLACEMENT (Left )  Patient location during evaluation: PACU Anesthesia Type: General Level of consciousness: awake and alert Pain management: pain level controlled Vital Signs Assessment: post-procedure vital signs reviewed and stable Respiratory status: spontaneous breathing, nonlabored ventilation, respiratory function stable and patient connected to nasal cannula oxygen Cardiovascular status: blood pressure returned to baseline and stable Postop Assessment: no apparent nausea or vomiting Anesthetic complications: no     Last Vitals:  Vitals:   03/28/19 1613 03/28/19 1652  BP: 112/70 106/67  Pulse: 69 61  Resp: 16 14  Temp: 36.5 C 36.6 C  SpO2: 97% 97%    Last Pain:  Vitals:   03/28/19 1640  TempSrc:   PainSc: 8                  Martha Clan

## 2019-03-28 NOTE — Progress Notes (Signed)
Pt complaining of increased pain. Primay nurse paged and spoke to Dr. Jannifer Franklin. MD to place orders.

## 2019-03-28 NOTE — Progress Notes (Signed)
I called and talked to the patient's husband and gave him an update of the patients plan of care.  All questions were answered.

## 2019-03-28 NOTE — Transfer of Care (Signed)
Immediate Anesthesia Transfer of Care Note  Patient: Andrea Lloyd  Procedure(s) Performed: CYSTOSCOPY WITH RETROGRADE PYELOGRAM/URETERAL STENT PLACEMENT (Left )  Patient Location: PACU  Anesthesia Type:General  Level of Consciousness: sedated  Airway & Oxygen Therapy: Patient Spontanous Breathing and Patient connected to face mask oxygen  Post-op Assessment: Report given to RN and Post -op Vital signs reviewed and stable  Post vital signs: Reviewed  Last Vitals:  Vitals Value Taken Time  BP 117/87 03/28/2019  3:20 PM  Temp 36.6 C 03/28/2019  3:19 PM  Pulse 83 03/28/2019  3:20 PM  Resp 20 03/28/2019  3:20 PM  SpO2 100 % 03/28/2019  3:20 PM  Vitals shown include unvalidated device data.  Last Pain:  Vitals:   03/28/19 1211  TempSrc:   PainSc: 7          Complications: No apparent anesthesia complications

## 2019-03-28 NOTE — ED Notes (Signed)
Report given to Gerlene Burdock in [place].

## 2019-03-28 NOTE — Anesthesia Procedure Notes (Signed)
Procedure Name: LMA Insertion Performed by: Rolla Plate, CRNA Pre-anesthesia Checklist: Patient identified, Patient being monitored, Timeout performed, Emergency Drugs available and Suction available Patient Re-evaluated:Patient Re-evaluated prior to induction Oxygen Delivery Method: Circle system utilized Preoxygenation: Pre-oxygenation with 100% oxygen Induction Type: IV induction Ventilation: Mask ventilation without difficulty LMA: LMA inserted LMA Size: 3.0 Tube type: Oral Number of attempts: 1 Placement Confirmation: positive ETCO2 and breath sounds checked- equal and bilateral Tube secured with: Tape Dental Injury: Teeth and Oropharynx as per pre-operative assessment

## 2019-03-28 NOTE — Anesthesia Preprocedure Evaluation (Signed)
Anesthesia Evaluation  Patient identified by MRN, date of birth, ID band Patient awake    Reviewed: Allergy & Precautions, H&P , NPO status , Patient's Chart, lab work & pertinent test results, reviewed documented beta blocker date and time   History of Anesthesia Complications Negative for: history of anesthetic complications  Airway Mallampati: I  TM Distance: >3 FB Neck ROM: full    Dental  (+) Caps, Implants, Dental Advidsory Given, Missing, Teeth Intact   Pulmonary neg shortness of breath, asthma , neg recent URI,           Cardiovascular Exercise Tolerance: Good negative cardio ROS       Neuro/Psych negative neurological ROS  negative psych ROS   GI/Hepatic Neg liver ROS, GERD  ,  Endo/Other  negative endocrine ROS  Renal/GU Renal disease  negative genitourinary   Musculoskeletal   Abdominal   Peds  Hematology negative hematology ROS (+)   Anesthesia Other Findings Past Medical History: No date: Asthma No date: Family history of adverse reaction to anesthesia     Comment:  Father - slow to wake No date: GERD (gastroesophageal reflux disease) No date: Gravida 4 para 4   Reproductive/Obstetrics negative OB ROS                             Anesthesia Physical Anesthesia Plan  ASA: II  Anesthesia Plan: General   Post-op Pain Management:    Induction: Intravenous  PONV Risk Score and Plan: 3 and Ondansetron, Dexamethasone, Midazolam, Promethazine and Treatment may vary due to age or medical condition  Airway Management Planned: LMA and Oral ETT  Additional Equipment:   Intra-op Plan:   Post-operative Plan: Extubation in OR  Informed Consent: I have reviewed the patients History and Physical, chart, labs and discussed the procedure including the risks, benefits and alternatives for the proposed anesthesia with the patient or authorized representative who has indicated  his/her understanding and acceptance.     Dental Advisory Given  Plan Discussed with: Anesthesiologist, CRNA and Surgeon  Anesthesia Plan Comments:         Anesthesia Quick Evaluation

## 2019-03-28 NOTE — ED Notes (Signed)
Pending COVID19 test results to call report.

## 2019-03-28 NOTE — Consult Note (Addendum)
Hematology/Oncology Consult note [facility] Telephone:([phone] [phone]  Patient Care Team: Alanson Aly, [place] as PCP - General (Family Medicine)   Name of the patient: Andrea Lloyd  [id number]  10-12-1956   Date of visit: 03/28/19 REASON FOR COSULTATION:  History of lymphoma now with worsening lymphadenopathy.   History of presenting illness-  63 y.o. 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 She reports that she was in her usual health state, pain started 8 days ago, left upper quadrant pain, no exacerbating or alleviating factors.  Progressively getting worse.Denies any associated nausea, vomiting, diarrhea  In the emergency room, CT of abdomen pelvis showed significantly worsening of abdominal lymphadenopathy with obstruction of left ureter with left-sided hydronephrosis. Patient is admitted for further management. Patient has been seen by urologist status post cystoscopy, left ureteral stent placement. Oncology was consulted for evaluation.  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  At that time work-up was prompt by left lower quadrant abdominal pain.  Work-up including CT and a PET scan showed upper abdominal and retroperitoneal lymph nodes measuring up to 1.9 cm.  Bone marrow biopsy negative for lymphoma.  As patient was otherwise doing well, lack of B symptoms, she is on observation.  Last seen by Dr. Janese Banks on 04/29/2018.  Patient was seen at the bedside. She denies fever, chills, unintentional weight loss.  Positive for sweating at night, which she attributes to perimenopausal symptoms.   Review of Systems  Constitutional: Negative for appetite change, chills, fatigue and fever.  HENT:   Negative for hearing loss and voice change.   Eyes: Negative for eye problems.    Respiratory: Negative for chest tightness and cough.   Cardiovascular: Negative for chest pain.  Gastrointestinal: Positive for abdominal pain. Negative for abdominal distention and blood in stool.  Endocrine: Positive for hot flashes.  Genitourinary: Negative for difficulty urinating and frequency.   Musculoskeletal: Negative for arthralgias.  Skin: Negative for itching and rash.  Neurological: Negative for extremity weakness.  Hematological: Negative for adenopathy.  Psychiatric/Behavioral: Negative for confusion.    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 ([place]) 01/28/2017   Abnormal computed tomography angiography (CTA) of abdomen and pelvis 01/25/2017   Airway hyperreactivity 04/06/2015   Disorder of rotator cuff 04/20/2014     Past Medical History:  Diagnosis Date   Asthma    Family history of adverse reaction to anesthesia    Father - slow to wake   GERD (gastroesophageal reflux disease)    Gravida 4 para 4      Past Surgical History:  Procedure Laterality Date   ABDOMINAL HYSTERECTOMY     06 01 2013   BLADDER SURGERY     COLONOSCOPY WITH PROPOFOL N/A 02/25/2017   Procedure: COLONOSCOPY WITH PROPOFOL;  Surgeon: Lucilla Lame, MD;  Location: [place];  Service: Endoscopy;  Laterality: N/A;   POLYPECTOMY  02/25/2017   Procedure: POLYPECTOMY;  Surgeon: Lucilla Lame, MD;  Location: [place];  Service: Endoscopy;;   ROTATOR CUFF REPAIR Right  TONSILLECTOMY  1972    Social History   Socioeconomic History   Marital status: Married    Spouse name: Not on file   Number of children: Not on file   Years of education: Not on file    Highest education level: Not on file  Occupational History   Not on file  Social Needs   Financial resource strain: Not on file   Food insecurity:    Worry: Not on file    Inability: Not on file   Transportation needs:    Medical: Not on file    Non-medical: Not on file  Tobacco Use   Smoking status: Never Smoker   Smokeless tobacco: Never Used  Substance and Sexual Activity   Alcohol use: No   Drug use: No   Sexual activity: Not Currently  Lifestyle   Physical activity:    Days per week: Not on file    Minutes per session: Not on file   Stress: Not on file  Relationships   Social connections:    Talks on phone: Not on file    Gets together: Not on file    Attends religious service: Not on file    Active member of club or organization: Not on file    Attends meetings of clubs or organizations: Not on file    Relationship status: Not on file   Intimate partner violence:    Fear of current or ex partner: Not on file    Emotionally abused: Not on file    Physically abused: Not on file    Forced sexual activity: Not on file  Other Topics Concern   Not on file  Social History Narrative   Not on file     Family History  Problem Relation Age of Onset   Alcohol abuse Maternal Grandfather    Healthy Mother    Cancer Father    Healthy Sister    Healthy Brother    Breast cancer Maternal Aunt    Hyperlipidemia Neg Hx    Heart disease Neg Hx    Diabetes Neg Hx      Current Facility-Administered Medications:    0.9 %  sodium chloride infusion, , Intravenous, Continuous, Sainani, Belia Heman, MD, Last Rate: 100 mL/hr at 03/28/19 1800   acetaminophen (TYLENOL) tablet 650 mg, 650 mg, Oral, Q6H PRN **OR** acetaminophen (TYLENOL) suppository 650 mg, 650 mg, Rectal, Q6H PRN, Sainani, Vivek J, MD   albuterol (PROVENTIL) (2.5 MG/3ML) 0.083% nebulizer solution 2.5 mg, 2.5 mg, Inhalation, Q6H PRN, Henreitta Leber, MD   [START ON 03/29/2019] enoxaparin  (LOVENOX) injection 40 mg, 40 mg, Subcutaneous, Q24H, Sainani, Belia Heman, MD   ketorolac (TORADOL) 30 MG/ML injection 30 mg, 30 mg, Intravenous, Q6H PRN, Henreitta Leber, MD, 30 mg at 03/28/19 1641   [START ON 03/29/2019] multivitamin with minerals tablet 1 tablet, 1 tablet, Oral, Daily, Sainani, Vivek J, MD   ondansetron (ZOFRAN) tablet 4 mg, 4 mg, Oral, Q6H PRN **OR** ondansetron (ZOFRAN) injection 4 mg, 4 mg, Intravenous, Q6H PRN, Sainani, Vivek J, MD   oxybutynin (DITROPAN) tablet 5 mg, 5 mg, Oral, Q8H PRN, Stoioff, Scott C, MD   sodium chloride flush (NS) 0.9 % injection 3 mL, 3 mL, Intravenous, Once, Abbie Sons, MD   Physical exam:   Vitals:   03/28/19 1550 03/28/19 1613 03/28/19 1652 03/28/19 1753  BP: 102/67 112/70 106/67 120/69  Pulse: 61 69 61 92  Resp: '16 16 14 15  ' Temp:  97.7 F (  36.5 C) 97.8 F (36.6 C) 97.7 F (36.5 C)  TempSrc:  Oral  Oral  SpO2: 100% 97% 97% 97%  Weight:      Height:       Physical Exam  Constitutional: She is oriented to person, place, and time. No distress.  HENT:  Head: Normocephalic and atraumatic.  Mouth/Throat: No oropharyngeal exudate.  Eyes: Pupils are equal, round, and reactive to light. EOM are normal. No scleral icterus.  Neck: Normal range of motion. Neck supple.  Cardiovascular: Normal rate and regular rhythm.  Pulmonary/Chest: Effort normal. No respiratory distress. She has no rales. She exhibits no tenderness.  Abdominal: Soft.  Musculoskeletal: Normal range of motion.        General: No edema.  Neurological: She is alert and oriented to person, place, and time. She exhibits normal muscle tone.  Skin: Skin is warm and dry. She is not diaphoretic. No erythema.  Psychiatric: Affect normal.        CMP Latest Ref Rng & Units 03/28/2019  Glucose 70 - 99 mg/dL 96  BUN 8 - 23 mg/dL 8  Creatinine 0.44 - 1.00 mg/dL 0.79  Sodium 135 - 145 mmol/L 141  Potassium 3.5 - 5.1 mmol/L 4.0  Chloride 98 - 111 mmol/L 104  CO2 22 -  32 mmol/L 26  Calcium 8.9 - 10.3 mg/dL 9.5  Total Protein 6.5 - 8.1 g/dL 7.9  Total Bilirubin 0.3 - 1.2 mg/dL 1.5(H)  Alkaline Phos 38 - 126 U/L 73  AST 15 - 41 U/L 27  ALT 0 - 44 U/L 29   CBC Latest Ref Rng & Units 03/28/2019  WBC 4.0 - 10.5 K/uL 7.6  Hemoglobin 12.0 - 15.0 g/dL 14.6  Hematocrit 36.0 - 46.0 % 45.5  Platelets 150 - 400 K/uL 274    '@IMAGES' @  Dg Abd 1 View  Result Date: 03/28/2019 CLINICAL DATA:  Left ureteral stent placement. FLUOROSCOPY TIME:  42 seconds. Images: 2 EXAM: ABDOMEN - 1 VIEW COMPARISON:  CT scan Mar 28, 2019 FINDINGS: By the end of the study, a stent has been placed in the left ureter. Only the proximal aspect of the stent is identified. The proximal portion of the stent has not formed a complete pigtail. Hydronephrosis remains on the final image of the study. IMPRESSION: Left ureteral stent placement as above. Electronically Signed   By: Dorise Bullion III M.D   On: 03/28/2019 17:42   Ct Abdomen Pelvis W Contrast  Result Date: 03/28/2019 CLINICAL DATA:  Left lower quadrant abdominal pain and peritoneal signs on exam. Prior hysterectomy. History of follicular lymphoma. EXAM: CT ABDOMEN AND PELVIS WITH CONTRAST TECHNIQUE: Multidetector CT imaging of the abdomen and pelvis was performed using the standard protocol following bolus administration of intravenous contrast. CONTRAST:  151m OMNIPAQUE IOHEXOL 300 MG/ML  SOLN COMPARISON:  12/31/2016 CT abdomen/pelvis and 01/08/2017 PET-CT. FINDINGS: Lower chest: Trace dependent left pleural effusion. New bilateral retrocrural adenopathy measuring up to 2.2 cm (series 2/image 23). Hepatobiliary: Normal liver size. No liver mass. Distended gallbladder. No gallbladder wall thickening. No radiopaque cholelithiasis. Minimal central intrahepatic biliary ductal dilatation, unchanged. CBD diameter 7 mm, slightly dilated, unchanged. No radiopaque choledocholithiasis. Pancreas: Normal, with no mass or duct dilation. Spleen: Normal  size. No mass. Adrenals/Urinary Tract: Normal adrenals. New moderate left hydronephrosis with mild left perinephric edema. No right hydronephrosis. No renal masses. Normal bladder. Stomach/Bowel: Normal non-distended stomach. Normal caliber small bowel with no small bowel wall thickening. Normal appendix. Scattered mild colonic diverticulosis, with no large  bowel wall thickening or significant pericolonic fat stranding. Vascular/Lymphatic: Atherosclerotic nonaneurysmal abdominal aorta. Patent portal, splenic, hepatic and renal veins. Massive confluent retroperitoneal adenopathy throughout pericaval, aortocaval and left periaortic chains, significantly increased from prior. Representative 7.3 cm short axis left para-aortic node (series 2/image 36), increased from 1.6 cm. Representative short axis 5.5 cm aortocaval node (series 2/image 32), increased from 2.1 cm. Reproductive: Status post hysterectomy, with no abnormal findings at the vaginal cuff. No adnexal mass. Other: No ascites. No pneumoperitoneum. Large subcutaneous 8.1 x 4.3 cm collection in the left gluteal region with thick wall (series 2/image 66), new from prior. Musculoskeletal: No aggressive appearing focal osseous lesions. Mild thoracolumbar spondylosis. IMPRESSION: 1. 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 2. New moderate left hydronephrosis due to extrinsic obstruction from the progressive adenopathy. 3. Trace dependent left pleural effusion. 4. Mild colonic diverticulosis, with no evidence of acute diverticulitis. No evidence of bowel obstruction. 5. Large subcutaneous collection in the left gluteal region with thick wall, nonspecific, potentially posttraumatic hematoma. Infected collection cannot be excluded. 6.  Aortic Atherosclerosis (ICD10-I70.0). Electronically Signed   By: Ilona Sorrel M.D.    On: 03/28/2019 10:40   Dg C-arm 1-60 Min  Result Date: 03/28/2019 CLINICAL DATA:  Left ureteral stent placement. FLUOROSCOPY TIME:  42 seconds. Images: 2 EXAM: ABDOMEN - 1 VIEW COMPARISON:  CT scan Mar 28, 2019 FINDINGS: By the end of the study, a stent has been placed in the left ureter. Only the proximal aspect of the stent is identified. The proximal portion of the stent has not formed a complete pigtail. Hydronephrosis remains on the final image of the study. IMPRESSION: Left ureteral stent placement as above. Electronically Signed   By: Dorise Bullion III M.D   On: 03/28/2019 17:42    Assessment and plan- Patient is a 63 y.o. female with history of follicular lymphoma on observation presented to emergency room for evaluation of left upper quadrant pain which started 8 days ago.  #Bulky retroperitoneal and retrocaval adenopathy, significantly progressed since study 2 years ago.  Compatible with lymphoma progression. Check LDH and uric acid.  Hepatitis panel HIV Recommend PET scan for restaging.  May consider repeat biopsy for confirmation of histology-rule out transformation to more aggressive histologies.  LDH normal.  elevated uric acid.  9.4.  Recommend to start allopurinol  If her symptoms improves, she can follow up with Dr.Rao outpatient and have PET scan work up.   #Hydronephrosis, status post ureteral stenting. #Continue supportive care including pain management, hydration.    Thank you for allowing me to participate in the care of this patient.  Total face to face encounter time for this patient visit was 70 min. >50% of the time was  spent in counseling and coordination of care.    Earlie Server, MD, PhD Hematology Oncology [facility] Pager- [phone] 03/28/2019

## 2019-03-28 NOTE — ED Notes (Signed)
Transported to OR

## 2019-03-28 NOTE — ED Provider Notes (Signed)
[facility] ([place]) Emergency Department Provider Note   ____________________________________________   None    (approximate)  I have reviewed the triage vital signs and the nursing notes.   HISTORY  Chief Complaint Abdominal Pain    HPI SHAQUILLA KEHRES is a 63 y.o. female here for evaluation of abdominal pain.  She describes pain starting the left upper abdomen about a week ago.  It slowly worsened with intermittent waxing and waning, then about 3 days ago continuing to worsen.  Spoke to her physician received a prescription for Cipro and Flagyl with a suspicion it could be for 6 diverticulitis.  Reports despite starting antibiotics yesterday morning she continues to have increasing pain discomfort is now radiating into her back pointing at her left costovertebral angle.  No urinary symptoms associated.  Somewhat loose a little bit different stool over the last few days.  No vomiting but some nausea.  No fevers or chills.  No contact with anyone known to have coronavirus.  Denies cough.  Denies upper respiratory symptoms. MCKAYLIN BASTIEN was evaluated in Emergency Department on 03/28/2019 for the symptoms described in the history of present illness. She was evaluated in the context of the global COVID-19 pandemic, which necessitated consideration that the patient might be at risk for infection with the SARS-CoV-2 virus that causes COVID-19. Institutional protocols and algorithms that pertain to the evaluation of patients at risk for COVID-19 are in a state of rapid change based on information released by regulatory bodies including the CDC and federal and state organizations. These policies and algorithms were followed during the patient's care in the ED.    Past Medical History:  Diagnosis Date  . Asthma   . Family history of adverse reaction to anesthesia    Father - slow to wake  . GERD (gastroesophageal reflux disease)   . Gravida 4 para 4     Patient  Active Problem List   Diagnosis Date Noted  . Colon cancer screening   . Benign neoplasm of transverse colon   . Polyp of sigmoid colon   . 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 Abnormal computed tomography angiography (CTA) of abdomen and pelvis 01/25/2017  . Airway hyperreactivity 04/06/2015  . Disorder of rotator cuff 04/20/2014    Past Surgical History:  Procedure Laterality Date  . ABDOMINAL HYSTERECTOMY     06 01 2013  . BLADDER SURGERY    . COLONOSCOPY WITH PROPOFOL N/A 02/25/2017   Procedure: COLONOSCOPY WITH PROPOFOL;  Surgeon: Lucilla Lame, MD;  Location: [place];  Service: Endoscopy;  Laterality: N/A;  . POLYPECTOMY  02/25/2017   Procedure: POLYPECTOMY;  Surgeon: Lucilla Lame, MD;  Location: [place];  Service: Endoscopy;;  . ROTATOR CUFF REPAIR Right   . TONSILLECTOMY  1972    Prior to Admission medications   Medication Sig Start Date End Date Taking? Authorizing Provider  Cholecalciferol (VITAMIN D3 PO) Take 20,000 mcg by mouth daily.   Yes [provider]  ciprofloxacin (CIPRO) 500 MG tablet Take 500 mg by mouth 2 (two) times a day. 03/27/19  Yes [provider]  Menaquinone-7 (VITAMIN K2 PO) Take 200 mcg by mouth daily.   Yes [provider]  metroNIDAZOLE (FLAGYL) 500 MG tablet Take 500 mg by mouth 2 (two) times a day. 03/27/19  Yes [provider]  Multiple Vitamin (MULTI-VITAMIN DAILY PO) Take by mouth.   Yes [provider]  Probiotic Product (PROBIOTIC PO)  Take by mouth daily.   Yes [provider]  albuterol (PROVENTIL HFA) 108 (90 Base) MCG/ACT inhaler Inhale into the lungs. 11/19/17   [provider]    Allergies Adhesive [tape]  Family History  Problem Relation Age of Onset  . Alcohol abuse Maternal Grandfather   . Healthy Mother   . Cancer Father   . Healthy Sister   . Healthy Brother   . Breast cancer Maternal Aunt   .  Hyperlipidemia Neg Hx   . Heart disease Neg Hx   . Diabetes Neg Hx     Social History Social History   Tobacco Use  . Smoking status: Never Smoker  . Smokeless tobacco: Never Used  Substance Use Topics  . Alcohol use: No  . Drug use: No    Review of Systems Constitutional: No fever/chills Eyes: No visual changes. ENT: No sore throat. Cardiovascular: Denies chest pain. Respiratory: Denies shortness of breath. Gastrointestinal: See HPI Genitourinary: Negative for dysuria. Musculoskeletal: Negative for back pain that the pain seems to be radiating out towards her left back and flank area. Skin: Negative for rash. Neurological: Negative for headaches, areas of focal weakness or numbness.    ____________________________________________   PHYSICAL EXAM:  VITAL SIGNS: ED Triage Vitals  Enc Vitals Group     BP 03/28/19 0840 (!) 147/94     Pulse Rate 03/28/19 0840 (!) 103     Resp 03/28/19 0840 20     Temp 03/28/19 0840 98.1 F (36.7 C)     Temp Source 03/28/19 0840 Oral     SpO2 03/28/19 0840 97 %     Weight 03/28/19 0841 182 lb (82.6 kg)     Height 03/28/19 0841 5\' 6"  (1.676 m)     Head Circumference --      Peak Flow --      Pain Score 03/28/19 0841 9     Pain Loc --      Pain Edu? --      Excl. in [place]? --     Constitutional: Alert and oriented.  He is pacing about the room back-and-forth appearing in pain and distress.  No respiratory or acute distress to suggest pending cardio vascular pulmonary etiology, but she does appear quite uncomfortable rating her pain about 8 out of 10 but appears much more like 10 out of 10.  Holding her left hand over her left upper abdomen and left flank region. Eyes: Conjunctivae are normal. Head: Atraumatic. Nose: No congestion/rhinnorhea. Mouth/Throat: Mucous membranes are moist. Neck: No stridor.  Cardiovascular: Normal rate, regular rhythm. Grossly normal heart sounds.  Good peripheral circulation. Respiratory: Normal  respiratory effort.  No retractions. Lungs CTAB. Gastrointestinal: Soft and moderately tender especially in the left upper quadrant.  Palpation on the right side refers pain to her left upper abdomen.  She has pain to percussion located primarily in the left upper quadrant and epigastrium.  There is no distention. No distention. Musculoskeletal: No lower extremity tenderness nor edema. Neurologic:  Normal speech and language. No gross focal neurologic deficits are appreciated.  Skin:  Skin is warm, dry and intact. No rash noted. Psychiatric: Mood and affect are normal. Speech and behavior are normal.  ____________________________________________   LABS (all labs ordered are listed, but only abnormal results are displayed)  Labs Reviewed  COMPREHENSIVE METABOLIC PANEL - Abnormal; Notable for the following components:      Result Value   Total Bilirubin 1.5 (*)    All other components within normal limits  CBC - Abnormal; Notable for the following components:   RBC 5.19 (*)    All other components within normal limits  SARS CORONAVIRUS 2 (HOSPITAL ORDER, PERFORMED IN [place] [facility])  LIPASE, BLOOD  TROPONIN I  URINALYSIS, COMPLETE (UACMP) WITH MICROSCOPIC   ____________________________________________  EKG  Reviewed entered by me at 847 Heart rate 100 QRS 70 QTc 440 Sinus tachycardia, no evidence of acute ischemia denoted. ____________________________________________  RADIOLOGY  Ct Abdomen Pelvis W Contrast  Result Date: 03/28/2019 CLINICAL DATA:  Left lower quadrant abdominal pain and peritoneal signs on exam. Prior hysterectomy. History of follicular lymphoma. EXAM: CT ABDOMEN AND PELVIS WITH CONTRAST TECHNIQUE: Multidetector CT imaging of the abdomen and pelvis was performed using the standard protocol following bolus administration of intravenous contrast. CONTRAST:  135mL OMNIPAQUE IOHEXOL 300 MG/ML  SOLN COMPARISON:  12/31/2016 CT abdomen/pelvis and 01/08/2017  PET-CT. FINDINGS: Lower chest: Trace dependent left pleural effusion. New bilateral retrocrural adenopathy measuring up to 2.2 cm (series 2/image 23). Hepatobiliary: Normal liver size. No liver mass. Distended gallbladder. No gallbladder wall thickening. No radiopaque cholelithiasis. Minimal central intrahepatic biliary ductal dilatation, unchanged. CBD diameter 7 mm, slightly dilated, unchanged. No radiopaque choledocholithiasis. Pancreas: Normal, with no mass or duct dilation. Spleen: Normal size. No mass. Adrenals/Urinary Tract: Normal adrenals. New moderate left hydronephrosis with mild left perinephric edema. No right hydronephrosis. No renal masses. Normal bladder. Stomach/Bowel: Normal non-distended stomach. Normal caliber small bowel with no small bowel wall thickening. Normal appendix. Scattered mild colonic diverticulosis, with no large bowel wall thickening or significant pericolonic fat stranding. Vascular/Lymphatic: Atherosclerotic nonaneurysmal abdominal aorta. Patent portal, splenic, hepatic and renal veins. Massive confluent retroperitoneal adenopathy throughout pericaval, aortocaval and left periaortic chains, significantly increased from prior. Representative 7.3 cm short axis left para-aortic node (series 2/image 36), increased from 1.6 cm. Representative short axis 5.5 cm aortocaval node (series 2/image 32), increased from 2.1 cm. Reproductive: Status post hysterectomy, with no abnormal findings at the vaginal cuff. No adnexal mass. Other: No ascites. No pneumoperitoneum. Large subcutaneous 8.1 x 4.3 cm collection in the left gluteal region with thick wall (series 2/image 66), new from prior. Musculoskeletal: No aggressive appearing focal osseous lesions. Mild thoracolumbar spondylosis. IMPRESSION: 1. 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 2. New  moderate left hydronephrosis due to extrinsic obstruction from the progressive adenopathy. 3. Trace dependent left pleural effusion. 4. Mild colonic diverticulosis, with no evidence of acute diverticulitis. No evidence of bowel obstruction. 5. Large subcutaneous collection in the left gluteal region with thick wall, nonspecific, potentially posttraumatic hematoma. Infected collection cannot be excluded. 6.  Aortic Atherosclerosis (ICD10-I70.0). Electronically Signed   By: Ilona Sorrel M.D.   On: 03/28/2019 10:40     CT scan discussed with patient.  Also discussed with Dr. Bernardo Heater ____________________________________________   PROCEDURES  Procedure(s) performed: None  Procedures  Critical Care performed: No  ____________________________________________   INITIAL IMPRESSION / ASSESSMENT AND PLAN / ED COURSE  Pertinent labs & imaging results that were available during my care of the patient were reviewed by me and considered in my medical decision making (see chart for details).   Differential diagnosis includes but is not limited to, abdominal perforation, aortic dissection, cholecystitis, appendicitis, diverticulitis, colitis, esophagitis/gastritis, kidney stone, pyelonephritis, urinary tract infection, aortic aneurysm. All are considered in decision and treatment plan. Based upon the patient's presentation and risk factors, proceed with CT imaging to further evaluate  No  signs or symptoms of acute cardiac or vascular etiology.  Patient with focal tenderness and discomfort left upper quadrant.  Peritonitis with peritoneal signs noted left upper quadrant.  Patient requesting only small dose of pain medication, she appears in notable excruciating pain but reports she just wants small doses that she can "bear it".  Discussed with the patient we will start with a small dose of morphine, and we can certainly add additional pain medicine to get her to a comfortable point.  She is comfortable with  this plan    ----------------------------------------- 12:01 PM on 03/28/2019 -----------------------------------------  Patient admitted to the hospital, medical service will admit the patient with anticipation also oncology consultation, discussed the case with urology Dr. Bernardo Heater will see the patient today in consultation for further care and treatment options.  Patient agreeable and understanding for plan for admission.  Appears stable for admission to hospital for further work-up due to acute left hydronephrosis, enlarging abdominal mass.  Return precautions and treatment recommendations and follow-up discussed with the patient who is agreeable with the plan.   ____________________________________________   FINAL CLINICAL IMPRESSION(S) / ED DIAGNOSES  Final diagnoses:  Abdominal mass, unspecified abdominal location  Hydronephrosis of left kidney        Note:  This document was prepared using Dragon voice recognition software and may include unintentional dictation errors       Delman Kitten, MD 03/28/19 1202

## 2019-03-28 NOTE — ED Triage Notes (Signed)
L upper abd pain x 8 days. Nausea.

## 2019-03-28 NOTE — Op Note (Signed)
Preoperative diagnosis:  1. Left hydronephrosis  Postoperative diagnosis:  1. Left hydronephrosis  Procedure:  1. Cystoscopy 2. Left ureteral stent placement (8FR) 26 cm 3. Left retrograde pyelography with interpretation   Surgeon: Nicki Reaper C. Stoioff, M.D.  Anesthesia: General  Complications: None  Intraoperative findings:  1.  Left retrograde pyelogram: Narrowing of the proximal ureter with moderate to severe left hydronephrosis  EBL: Minimal  Specimens: None  Indication: Andrea Lloyd is a 63 y.o. patient presenting to the ED with an 8-day history of progressively worsening left upper quadrant abdominal pain.  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  She had moderate left hydronephrosis. After reviewing the management options for treatment, he elected to proceed with the above surgical procedure(s). We have discussed the potential benefits and risks of the procedure, side effects of the proposed treatment, the likelihood of the patient achieving the goals of the procedure, and any potential problems that might occur during the procedure or recuperation. Informed consent has been obtained.  Description of procedure:  The patient was taken to the operating room and general anesthesia was induced.  The patient was placed in the dorsal lithotomy position, prepped and draped in the usual sterile fashion, and preoperative antibiotics were administered. A preoperative time-out was performed.   A 21 French cystoscope was lubricated and passed per urethra.  The bladder was then systematically examined in its entirety. There was no evidence for any bladder tumors, stones, or other mucosal pathology.  The left ureteral orifice was normal in position and clear efflux was noted.  The right ureteral orifice was  normal.  Attention then turned to the left ureteral orifice and a ureteral catheter was used to intubate the ureteral orifice.  Omnipaque contrast was injected through the ureteral catheter and a retrograde pyelogram was performed with findings as dictated above.  A 0.38 sensor guidewire was then advanced through the ureteral catheter and up the ureter into the renal pelvis under fluoroscopic guidance.  An 8 French/26 cm double-J ureteral stent was then advanced over the wire.  The wire was then removed with an adequate stent curl noted in the renal pelvis as well as in the bladder.  The bladder was then emptied and the procedure ended.  The patient appeared to tolerate the procedure well and without complications.  The patient was able to be awakened and transferred to the recovery unit in satisfactory condition.   John Giovanni, MD

## 2019-03-28 NOTE — H&P (Signed)
[facility] [place] NAME: Andrea Lloyd    MR#:  [id number]  DATE OF BIRTH:  Mar 16, 1956  DATE OF ADMISSION:  03/28/2019  PRIMARY CARE PHYSICIAN: Alanson Aly, FNP  REQUESTING/REFERRING PHYSICIAN: Dr. Delman Kitten  CHIEF COMPLAINT:   Chief Complaint  Patient presents with   Abdominal Pain    HISTORY OF PRESENT ILLNESS:  Andrea Lloyd  is a 63 y.o. female with a known history of asthma, GERD, history of lymphoma which is in remission followed by Dr. Janese Banks who presents to the hospital due to left upper quadrant abdominal pain ongoing for the past week.  Patient says she was in her usual state of health and developed left upper quadrant abdominal pain which has progressively gotten worse.  She denies any associated nausea or vomiting or any diarrhea or any other associated symptoms.  She had a tele-visit with her PCP/gastroenterologist who put her on empiric ciprofloxacin and Flagyl 2 days ago but despite that she continued to have worsening symptoms and therefore came to the ER for further evaluation.  In the emergency room patient went to CT scan of the abdomen pelvis which showed worsening lymphadenopathy with obstruction of the left ureter with left-sided hydronephrosis.  Hospitalist services were contacted for admission.  Patient denies any documented fever but admits to some night sweats attributed to postmenopausal symptoms, no loss of taste, sick contacts or recent travel history.  Patient's COVID-19 test is still pending.  PAST MEDICAL HISTORY:   Past Medical History:  Diagnosis Date   Asthma    Family history of adverse reaction to anesthesia    Father - slow to wake   GERD (gastroesophageal reflux disease)    Gravida 4 para 4     PAST SURGICAL HISTORY:   Past Surgical History:  Procedure Laterality Date   ABDOMINAL HYSTERECTOMY     06 01 2013   BLADDER SURGERY     COLONOSCOPY WITH PROPOFOL N/A 02/25/2017   Procedure: COLONOSCOPY WITH PROPOFOL;  Surgeon: Lucilla Lame, MD;  Location: [facility];  Service: Endoscopy;  Laterality: N/A;   POLYPECTOMY  02/25/2017   Procedure: POLYPECTOMY;  Surgeon: Lucilla Lame, MD;  Location: [facility];  Service: Endoscopy;;   ROTATOR CUFF REPAIR Right    TONSILLECTOMY  1972    SOCIAL HISTORY:   Social History   Tobacco Use   Smoking status: Never Smoker   Smokeless tobacco: Never Used  Substance Use Topics   Alcohol use: No    FAMILY HISTORY:   Family History  Problem Relation Age of Onset   Alcohol abuse Maternal Grandfather    Healthy Mother    Cancer Father    Healthy Sister    Healthy Brother    Breast cancer Maternal Aunt    Hyperlipidemia Neg Hx    Heart disease Neg Hx    Diabetes Neg Hx     DRUG ALLERGIES:   Allergies  Allergen Reactions   Adhesive [Tape] Rash    Bandaids    REVIEW OF SYSTEMS:   Review of Systems  Constitutional: Negative for fever and weight loss.  HENT: Negative for congestion, nosebleeds and tinnitus.   Eyes: Negative for blurred vision, double vision and redness.  Respiratory: Negative for cough, hemoptysis and shortness of breath.   Cardiovascular: Negative for chest pain, orthopnea, leg swelling and PND.  Gastrointestinal: Positive for abdominal pain (Left upper Quard[place] ). Negative for diarrhea, melena, nausea and vomiting.  Genitourinary: Negative for  dysuria, hematuria and urgency.  Musculoskeletal: Negative for falls and joint pain.  Neurological: Negative for dizziness, tingling, sensory change, focal weakness, seizures, weakness and headaches.  Endo/Heme/Allergies: Negative for polydipsia. Does not bruise/bleed easily.  Psychiatric/Behavioral: Negative for depression and memory loss. The patient is not nervous/anxious.     MEDICATIONS AT HOME:   Prior to Admission medications   Medication Sig Start Date End Date Taking? Authorizing Provider  Cholecalciferol  (VITAMIN D3 PO) Take 20,000 mcg by mouth daily.   Yes [provider]  ciprofloxacin (CIPRO) 500 MG tablet Take 500 mg by mouth 2 (two) times a day. 03/27/19  Yes [provider]  Menaquinone-7 (VITAMIN K2 PO) Take 200 mcg by mouth daily.   Yes [provider]  metroNIDAZOLE (FLAGYL) 500 MG tablet Take 500 mg by mouth 2 (two) times a day. 03/27/19  Yes [provider]  Multiple Vitamin (MULTI-VITAMIN DAILY PO) Take by mouth.   Yes [provider]  Probiotic Product (PROBIOTIC PO) Take by mouth daily.   Yes [provider]  albuterol (PROVENTIL HFA) 108 (90 Base) MCG/ACT inhaler Inhale into the lungs. 11/19/17   [provider]      VITAL SIGNS:  Blood pressure (!) 147/94, pulse (!) 103, temperature 98.1 F (36.7 C), temperature source Oral, resp. rate 20, height 5\' 6"  (1.676 m), weight 82.6 kg, SpO2 97 %.  PHYSICAL EXAMINATION:  Physical Exam  GENERAL:  63 y.o.-year-old patient lying in the bed with no acute distress.  EYES: Pupils equal, round, reactive to light and accommodation. No scleral icterus. Extraocular muscles intact.  HEENT: Head atraumatic, normocephalic. Oropharynx and nasopharynx clear. No oropharyngeal erythema, moist oral mucosa  NECK:  Supple, no jugular venous distention. No thyroid enlargement, no tenderness.  LUNGS: Normal breath sounds bilaterally, no wheezing, rales, rhonchi. No use of accessory muscles of respiration.  CARDIOVASCULAR: S1, S2 RRR. No murmurs, rubs, gallops, clicks.  ABDOMEN: Soft, Left upper Quadrant abdominal pain, no rebound, rigidity, nondistended. Bowel sounds present. No organomegaly or mass.  EXTREMITIES: No pedal edema, cyanosis, or clubbing. + 2 pedal & radial pulses b/l.   NEUROLOGIC: Cranial nerves II through XII are intact. No focal Motor or sensory deficits appreciated b/l PSYCHIATRIC: The patient is alert and oriented x 3.  SKIN: No obvious rash, lesion, or ulcer.   LABORATORY  PANEL:   CBC Recent Labs  Lab 03/28/19 0905  WBC 7.6  HGB 14.6  HCT 45.5  PLT 274   ------------------------------------------------------------------------------------------------------------------  Chemistries  Recent Labs  Lab 03/28/19 0905  NA 141  K 4.0  CL 104  CO2 26  GLUCOSE 96  BUN 8  CREATININE 0.79  CALCIUM 9.5  AST 27  ALT 29  ALKPHOS 73  BILITOT 1.5*   ------------------------------------------------------------------------------------------------------------------  Cardiac Enzymes Recent Labs  Lab 03/28/19 0905  TROPONINI <0.03   ------------------------------------------------------------------------------------------------------------------  RADIOLOGY:  Ct Abdomen Pelvis W Contrast  Result Date: 03/28/2019 CLINICAL DATA:  Left lower quadrant abdominal pain and peritoneal signs on exam. Prior hysterectomy. History of follicular lymphoma. EXAM: CT ABDOMEN AND PELVIS WITH CONTRAST TECHNIQUE: Multidetector CT imaging of the abdomen and pelvis was performed using the standard protocol following bolus administration of intravenous contrast. CONTRAST:  166mL OMNIPAQUE IOHEXOL 300 MG/ML  SOLN COMPARISON:  12/31/2016 CT abdomen/pelvis and 01/08/2017 PET-CT. FINDINGS: Lower chest: Trace dependent left pleural effusion. New bilateral retrocrural adenopathy measuring up to 2.2 cm (series 2/image 23). Hepatobiliary: Normal liver size. No liver mass. Distended gallbladder. No gallbladder wall thickening. No radiopaque  cholelithiasis. Minimal central intrahepatic biliary ductal dilatation, unchanged. CBD diameter 7 mm, slightly dilated, unchanged. No radiopaque choledocholithiasis. Pancreas: Normal, with no mass or duct dilation. Spleen: Normal size. No mass. Adrenals/Urinary Tract: Normal adrenals. New moderate left hydronephrosis with mild left perinephric edema. No right hydronephrosis. No renal masses. Normal bladder. Stomach/Bowel: Normal non-distended stomach. Normal  caliber small bowel with no small bowel wall thickening. Normal appendix. Scattered mild colonic diverticulosis, with no large bowel wall thickening or significant pericolonic fat stranding. Vascular/Lymphatic: Atherosclerotic nonaneurysmal abdominal aorta. Patent portal, splenic, hepatic and renal veins. Massive confluent retroperitoneal adenopathy throughout pericaval, aortocaval and left periaortic chains, significantly increased from prior. Representative 7.3 cm short axis left para-aortic node (series 2/image 36), increased from 1.6 cm. Representative short axis 5.5 cm aortocaval node (series 2/image 32), increased from 2.1 cm. Reproductive: Status post hysterectomy, with no abnormal findings at the vaginal cuff. No adnexal mass. Other: No ascites. No pneumoperitoneum. Large subcutaneous 8.1 x 4.3 cm collection in the left gluteal region with thick wall (series 2/image 66), new from prior. Musculoskeletal: No aggressive appearing focal osseous lesions. Mild thoracolumbar spondylosis. IMPRESSION: 1. 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 2. New moderate left hydronephrosis due to extrinsic obstruction from the progressive adenopathy. 3. Trace dependent left pleural effusion. 4. Mild colonic diverticulosis, with no evidence of acute diverticulitis. No evidence of bowel obstruction. 5. Large subcutaneous collection in the left gluteal region with thick wall, nonspecific, potentially posttraumatic hematoma. Infected collection cannot be excluded. 6.  Aortic Atherosclerosis (ICD10-I70.0). Electronically Signed   By: Ilona Sorrel M.D.   On: 03/28/2019 10:40     IMPRESSION AND PLAN:   63 year old female with past medical history of lymphadenopathy secondary to lymphoma, asthma, GERD who presented to the hospital due to left upper quadrant abdominal pain and  noted to have a CT scan of the abdomen pelvis showing extrinsic obstruction of the left-sided ureter and left-sided hydronephrosis.  1.  Left upper quadrant abdominal pain- secondary to left-sided extrinsic obstruction of the ureter with hydronephrosis from bulky lymphadenopathy from underlying history of lymphoma. - Continue supportive care with pain control.  Patient's voiding well and her renal function is stable. - Await urology consult and the plan on either putting a ureteral stent versus percutaneous nephrostomy tube and they will discuss this with the patient. - Continue IV fluids for now.  2.  History of lymphoma- this has been in remission and patient is followed by Dr. Janese Banks.  She now comes in with abdominal pain which shows worsening lymphadenopathy with things like obstruction as mentioned above. - We will consult oncology.  Discussed with Dr. Tasia Catchings.  3.  Asthma-no acute exacerbation.  Continue albuterol inhaler as needed.    All the records are reviewed and case discussed with ED provider. Management plans discussed with the patient, family and they are in agreement.  CODE STATUS: Full code  TOTAL TIME TAKING CARE OF THIS PATIENT: 40 minutes.    Henreitta Leber M.D on 03/28/2019 at 12:13 PM  Between 7am to 6pm - Pager - [phone]  After 6pm go to [email or web address] - password [email or web address] [facility]  Office  [phone]  CC: Primary care physician; Patient, [name]

## 2019-03-29 ENCOUNTER — Encounter: Payer: Self-pay | Admitting: Urology

## 2019-03-29 DIAGNOSIS — C8293 Follicular lymphoma, unspecified, intra-abdominal lymph nodes: Secondary | ICD-10-CM | POA: Diagnosis not present

## 2019-03-29 DIAGNOSIS — E790 Hyperuricemia without signs of inflammatory arthritis and tophaceous disease: Secondary | ICD-10-CM | POA: Diagnosis not present

## 2019-03-29 DIAGNOSIS — N1330 Unspecified hydronephrosis: Secondary | ICD-10-CM | POA: Diagnosis present

## 2019-03-29 DIAGNOSIS — N135 Crossing vessel and stricture of ureter without hydronephrosis: Secondary | ICD-10-CM | POA: Diagnosis not present

## 2019-03-29 LAB — BASIC METABOLIC PANEL
Anion gap: 11 (ref 5–15)
BUN: 11 mg/dL (ref 8–23)
CO2: 23 mmol/L (ref 22–32)
Calcium: 8.6 mg/dL — ABNORMAL LOW (ref 8.9–10.3)
Chloride: 107 mmol/L (ref 98–111)
Creatinine, Ser: 0.74 mg/dL (ref 0.44–1.00)
GFR calc Af Amer: >60 mL/min (ref >60)
GFR calc non Af Amer: >60 mL/min (ref >60)
Glucose, Bld: 126 mg/dL — ABNORMAL HIGH (ref 70–99)
Potassium: 4.2 mmol/L (ref 3.5–5.1)
Sodium: 141 mmol/L (ref 135–145)

## 2019-03-29 LAB — CBC
HCT: 38.9 % (ref 36.0–46.0)
Hemoglobin: 12.6 g/dL (ref 12.0–15.0)
MCH: 28.1 pg (ref 26.0–34.0)
MCHC: 32.4 g/dL (ref 30.0–36.0)
MCV: 86.6 fL (ref 80.0–100.0)
Platelets: 266 10*3/uL (ref 150–400)
RBC: 4.49 MIL/uL (ref 3.87–5.11)
RDW: 13.4 % (ref 11.5–15.5)
WBC: 6.3 10*3/uL (ref 4.0–10.5)
nRBC: 0 % (ref 0.0–0.2)

## 2019-03-29 MED ORDER — OXYBUTYNIN CHLORIDE 5 MG PO TABS: 5.0000 mg | ORAL_TABLET | Freq: Three times a day (TID) | ORAL | 0 refills | Status: DC | PRN

## 2019-03-29 MED ORDER — PHENAZOPYRIDINE HCL 100 MG PO TABS
100.0000 mg | ORAL_TABLET | Freq: Three times a day (TID) | ORAL | Status: DC
  Filled 2019-03-29 (×2): qty 1

## 2019-03-29 MED ORDER — ALLOPURINOL 100 MG PO TABS: 100.0000 mg | ORAL_TABLET | Freq: Every day | ORAL | 0 refills | Status: DC

## 2019-03-29 NOTE — Progress Notes (Signed)
Hematology/Oncology Progress Note [facility] Telephone:([phone] [phone]  Patient Care Team: Alanson Aly, [place] as PCP - General (Family Medicine)   Name of the patient: Andrea Lloyd  [id number]  62-Jun-1957  Date of visit: 03/29/19   INTERVAL HISTORY-  Sitting the bed.  She reports feeling better today.  Pain is better controlled.   Review of systems- Review of Systems  Constitutional: Negative for chills and fatigue.  Respiratory: Negative for shortness of breath.   Cardiovascular: Negative for chest pain.  Gastrointestinal: Negative for abdominal pain.  Genitourinary: Negative for difficulty urinating.   Skin: Negative for rash.  Neurological: Negative for dizziness.  Psychiatric/Behavioral: Negative for confusion.    Allergies  Allergen Reactions  . Adhesive [Tape] Rash    Bandaids    Patient Active Problem List   Diagnosis Date Noted  . Hydronephrosis 03/29/2019  . Hydronephrosis of left kidney 03/28/2019  . Abdominal mass   . Ureteral obstruction, left   . Hyperuricemia   . Follicular lymphoma of intra-abdominal lymph nodes ([place])   . Colon cancer screening   . Benign neoplasm of transverse colon   . Polyp of sigmoid colon   . 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 Abnormal computed tomography angiography (CTA) of abdomen and pelvis 01/25/2017  . Airway hyperreactivity 04/06/2015  . 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 Asthma   . Family history of adverse reaction to anesthesia    Father - slow to wake  . GERD (gastroesophageal reflux disease)   . Gravida 4 para 4      Past Surgical History:  Procedure Laterality Date  . ABDOMINAL HYSTERECTOMY     06 01 2013  . BLADDER SURGERY    . COLONOSCOPY WITH PROPOFOL N/A 02/25/2017   Procedure: COLONOSCOPY WITH PROPOFOL;  Surgeon: Lucilla Lame, MD;  Location: [place];  Service: Endoscopy;  Laterality: N/A;  . POLYPECTOMY  02/25/2017   Procedure: POLYPECTOMY;  Surgeon: Lucilla Lame, MD;  Location: [place];  Service: Endoscopy;;  . ROTATOR CUFF REPAIR Right   . TONSILLECTOMY  1972    Social History   Socioeconomic History  . Marital status: Married    Spouse name: Not on file  . Number of children: Not on file  . Years of education: Not on file  . Highest education level: Not on file  Occupational History  . Not on file  Social Needs  . Financial resource strain: Not on file  . Food insecurity:    Worry: Not on file    Inability: Not on file  . Transportation needs:    Medical: Not on file    Non-medical: Not on file  Tobacco Use  . Smoking status: Never Smoker  . Smokeless tobacco: Never Used  Substance and Sexual Activity  . Alcohol use: No  . Drug use: No  . Sexual activity: Not Currently  Lifestyle  . Physical activity:    Days per week: Not on file    Minutes per session: Not on file  . Stress: Not on file  Relationships  . Social connections:    Talks on phone: Not on file    Gets together: Not on file    Attends religious service: Not on file    Active member of club or organization: Not on file    Attends meetings of clubs or organizations: Not on file  Relationship status: Not on file  . Intimate partner violence:    Fear of current or ex partner: Not on file    Emotionally abused: Not on file    Physically abused: Not on file    Forced sexual activity: Not on file  Other Topics Concern  . Not on file  Social History Narrative  . Not on file     Family History  Problem Relation Age of Onset  . Alcohol abuse Maternal Grandfather   . Healthy Mother   . Cancer Father   . Healthy Sister   . Healthy Brother   . Breast cancer Maternal Aunt   . Hyperlipidemia Neg Hx   . Heart disease Neg Hx   . Diabetes Neg Hx      Current Facility-Administered Medications:  .  0.9 %  sodium chloride  infusion, , Intravenous, Continuous, Sainani, Belia Heman, MD, Last Rate: 100 mL/hr at 03/29/19 0154 .  acetaminophen (TYLENOL) tablet 650 mg, 650 mg, Oral, Q6H PRN, 650 mg at 03/28/19 1931 **OR** acetaminophen (TYLENOL) suppository 650 mg, 650 mg, Rectal, Q6H PRN, Sainani, Vivek J, MD .  albuterol (PROVENTIL) (2.5 MG/3ML) 0.083% nebulizer solution 2.5 mg, 2.5 mg, Inhalation, Q6H PRN, Henreitta Leber, MD .  allopurinol (ZYLOPRIM) tablet 100 mg, 100 mg, Oral, Daily, Earlie Server, MD, 100 mg at 03/29/19 0818 .  enoxaparin (LOVENOX) injection 40 mg, 40 mg, Subcutaneous, Q24H, Sainani, Belia Heman, MD, 40 mg at 03/29/19 0818 .  ketorolac (TORADOL) 30 MG/ML injection 30 mg, 30 mg, Intravenous, Q6H PRN, Henreitta Leber, MD, 30 mg at 03/29/19 0949 .  morphine 2 MG/ML injection 2 mg, 2 mg, Intravenous, Q4H PRN, Lance Coon, MD, 2 mg at 03/29/19 [phone] .  multivitamin with minerals tablet 1 tablet, 1 tablet, Oral, Daily, Henreitta Leber, MD, 1 tablet at 03/29/19 0836 .  ondansetron (ZOFRAN) tablet 4 mg, 4 mg, Oral, Q6H PRN **OR** ondansetron (ZOFRAN) injection 4 mg, 4 mg, Intravenous, Q6H PRN, Sainani, Vivek J, MD .  oxybutynin (DITROPAN) tablet 5 mg, 5 mg, Oral, Q8H PRN, Stoioff, Scott C, MD, 5 mg at 03/29/19 1051 .  sodium chloride flush (NS) 0.9 % injection 3 mL, 3 mL, Intravenous, Once, Abbie Sons, MD   Physical exam:  Vitals:   03/28/19 1652 03/28/19 1753 03/28/19 2020 03/29/19 0540  BP: 106/67 120/69 111/72 110/64  Pulse: 61 92 68 66  Resp: 14 15 18 18   Temp: 97.8 F (36.6 C) 97.7 F (36.5 C) 97.6 F (36.4 C) 97.8 F (36.6 C)  TempSrc:  Oral Oral Oral  SpO2: 97% 97% 96% 95%  Weight:      Height:       Physical Exam  Constitutional: She is oriented to person, place, and time. No distress.  HENT:  Head: Normocephalic and atraumatic.  Eyes: Pupils are equal, round, and reactive to light.  Neck: Neck supple.  Pulmonary/Chest: Effort normal and breath sounds normal.  Abdominal: Soft.   Musculoskeletal: Normal range of motion.  Neurological: She is alert and oriented to person, place, and time.  Psychiatric: Affect normal.       CMP Latest Ref Rng & Units 03/29/2019  Glucose 70 - 99 mg/dL 126(H)  BUN 8 - 23 mg/dL 11  Creatinine 0.44 - 1.00 mg/dL 0.74  Sodium 135 - 145 mmol/L 141  Potassium 3.5 - 5.1 mmol/L 4.2  Chloride 98 - 111 mmol/L 107  CO2 22 - 32 mmol/L 23  Calcium 8.9 - 10.3 mg/dL 8.6(L)  Total Protein 6.5 - 8.1 g/dL -  Total Bilirubin 0.3 - 1.2 mg/dL -  Alkaline Phos 38 - 126 U/L -  AST 15 - 41 U/L -  ALT 0 - 44 U/L -   CBC Latest Ref Rng & Units 03/29/2019  WBC 4.0 - 10.5 K/uL 6.3  Hemoglobin 12.0 - 15.0 g/dL 12.6  Hematocrit 36.0 - 46.0 % 38.9  Platelets 150 - 400 K/uL 266   RADIOGRAPHIC STUDIES: I have personally reviewed the radiological images as listed and agreed with the findings in the report.  Dg Abd 1 View  Result Date: 03/28/2019 CLINICAL DATA:  Left ureteral stent placement. FLUOROSCOPY TIME:  42 seconds. Images: 2 EXAM: ABDOMEN - 1 VIEW COMPARISON:  CT scan Mar 28, 2019 FINDINGS: By the end of the study, a stent has been placed in the left ureter. Only the proximal aspect of the stent is identified. The proximal portion of the stent has not formed a complete pigtail. Hydronephrosis remains on the final image of the study. IMPRESSION: Left ureteral stent placement as above. Electronically Signed   By: Dorise Bullion III M.D   On: 03/28/2019 17:42   Ct Abdomen Pelvis W Contrast  Result Date: 03/28/2019 CLINICAL DATA:  Left lower quadrant abdominal pain and peritoneal signs on exam. Prior hysterectomy. History of follicular lymphoma. EXAM: CT ABDOMEN AND PELVIS WITH CONTRAST TECHNIQUE: Multidetector CT imaging of the abdomen and pelvis was performed using the standard protocol following bolus administration of intravenous contrast. CONTRAST:  122mL OMNIPAQUE IOHEXOL 300 MG/ML  SOLN COMPARISON:  12/31/2016 CT abdomen/pelvis and 01/08/2017  PET-CT. FINDINGS: Lower chest: Trace dependent left pleural effusion. New bilateral retrocrural adenopathy measuring up to 2.2 cm (series 2/image 23). Hepatobiliary: Normal liver size. No liver mass. Distended gallbladder. No gallbladder wall thickening. No radiopaque cholelithiasis. Minimal central intrahepatic biliary ductal dilatation, unchanged. CBD diameter 7 mm, slightly dilated, unchanged. No radiopaque choledocholithiasis. Pancreas: Normal, with no mass or duct dilation. Spleen: Normal size. No mass. Adrenals/Urinary Tract: Normal adrenals. New moderate left hydronephrosis with mild left perinephric edema. No right hydronephrosis. No renal masses. Normal bladder. Stomach/Bowel: Normal non-distended stomach. Normal caliber small bowel with no small bowel wall thickening. Normal appendix. Scattered mild colonic diverticulosis, with no large bowel wall thickening or significant pericolonic fat stranding. Vascular/Lymphatic: Atherosclerotic nonaneurysmal abdominal aorta. Patent portal, splenic, hepatic and renal veins. Massive confluent retroperitoneal adenopathy throughout pericaval, aortocaval and left periaortic chains, significantly increased from prior. Representative 7.3 cm short axis left para-aortic node (series 2/image 36), increased from 1.6 cm. Representative short axis 5.5 cm aortocaval node (series 2/image 32), increased from 2.1 cm. Reproductive: Status post hysterectomy, with no abnormal findings at the vaginal cuff. No adnexal mass. Other: No ascites. No pneumoperitoneum. Large subcutaneous 8.1 x 4.3 cm collection in the left gluteal region with thick wall (series 2/image 66), new from prior. Musculoskeletal: No aggressive appearing focal osseous lesions. Mild thoracolumbar spondylosis. IMPRESSION: 1. 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 2. New  moderate left hydronephrosis due to extrinsic obstruction from the progressive adenopathy. 3. Trace dependent left pleural effusion. 4. Mild colonic diverticulosis, with no evidence of acute diverticulitis. No evidence of bowel obstruction. 5. Large subcutaneous collection in the left gluteal region with thick wall, nonspecific, potentially posttraumatic hematoma. Infected collection cannot be excluded. 6.  Aortic Atherosclerosis (ICD10-I70.0). Electronically Signed   By: Ilona Sorrel M.D.   On: 03/28/2019 10:40  Dg C-arm 1-60 Min  Result Date: 03/28/2019 CLINICAL DATA:  Left ureteral stent placement. FLUOROSCOPY TIME:  42 seconds. Images: 2 EXAM: ABDOMEN - 1 VIEW COMPARISON:  CT scan Mar 28, 2019 FINDINGS: By the end of the study, a stent has been placed in the left ureter. Only the proximal aspect of the stent is identified. The proximal portion of the stent has not formed a complete pigtail. Hydronephrosis remains on the final image of the study. IMPRESSION: Left ureteral stent placement as above. Electronically Signed   By: Dorise Bullion III M.D   On: 03/28/2019 17:42    Assessment and plan-  Patient is a 63 y.o. female female with history of follicular lymphoma on observation presented to emergency room for evaluation of left upper quadrant pain   #Bulky retroperitoneal and retrocaval adenopathy, significantly progressed since study 2 years ago.  Compatible with lymphoma progression. LDH normal.  Slightly elevated uric acid at 9.4.  Started on prophylactic allopurinol.  Start with a low-dose 100 mg daily today, can be further titrated up to 300 mg daily outpatient. She is doing better today.  Stable.  She will be discharged today and follow-up with Dr. Janese Banks outpatient for PET scan and a possible biopsy for confirmation of histology.Discussed with patient.  #Hydronephrosis, status post ureteral stenting.  Follow-up with urology outpatient. Plan was discussed with Dr. Verdell Carmine.    Thank you  for allowing me to participate in the care of this patient.   Earlie Server, MD, PhD 03/29/2019

## 2019-03-29 NOTE — Progress Notes (Signed)
Discharge instructions reviewed with the patient. IV removed. Waiting for husband to pick the patient up

## 2019-03-29 NOTE — Progress Notes (Signed)
Dr Verdell Carmine just informed me after talking with Dr Bernardo Heater to cancel the pyridium order and just use oxybutin

## 2019-03-29 NOTE — Progress Notes (Signed)
Patient complaining of discomfort in lower abdomen. Order received from Dr Verdell Carmine for pyridium

## 2019-03-30 ENCOUNTER — Encounter: Payer: Self-pay | Admitting: Oncology

## 2019-03-30 ENCOUNTER — Other Ambulatory Visit: Payer: Self-pay | Admitting: *Deleted

## 2019-03-30 ENCOUNTER — Inpatient Hospital Stay: Payer: BLUE CROSS/BLUE SHIELD | Attending: Oncology | Admitting: Oncology

## 2019-03-30 ENCOUNTER — Other Ambulatory Visit: Payer: Self-pay

## 2019-03-30 DIAGNOSIS — R9389 Abnormal findings on diagnostic imaging of other specified body structures: Secondary | ICD-10-CM

## 2019-03-30 DIAGNOSIS — N1330 Unspecified hydronephrosis: Secondary | ICD-10-CM | POA: Insufficient documentation

## 2019-03-30 DIAGNOSIS — C8203 Follicular lymphoma grade I, intra-abdominal lymph nodes: Secondary | ICD-10-CM | POA: Diagnosis not present

## 2019-03-30 DIAGNOSIS — Z90710 Acquired absence of both cervix and uterus: Secondary | ICD-10-CM | POA: Insufficient documentation

## 2019-03-30 DIAGNOSIS — Z5112 Encounter for antineoplastic immunotherapy: Secondary | ICD-10-CM | POA: Insufficient documentation

## 2019-03-30 DIAGNOSIS — Z5111 Encounter for antineoplastic chemotherapy: Secondary | ICD-10-CM | POA: Insufficient documentation

## 2019-03-30 DIAGNOSIS — Z7189 Other specified counseling: Secondary | ICD-10-CM | POA: Diagnosis not present

## 2019-03-30 DIAGNOSIS — M545 Low back pain: Secondary | ICD-10-CM | POA: Insufficient documentation

## 2019-03-30 DIAGNOSIS — R590 Localized enlarged lymph nodes: Secondary | ICD-10-CM | POA: Insufficient documentation

## 2019-03-30 DIAGNOSIS — Z5189 Encounter for other specified aftercare: Secondary | ICD-10-CM | POA: Insufficient documentation

## 2019-03-30 DIAGNOSIS — C8238 Follicular lymphoma grade IIIa, lymph nodes of multiple sites: Secondary | ICD-10-CM | POA: Insufficient documentation

## 2019-03-30 NOTE — Progress Notes (Signed)
Pt was just in the hospital this weekend. She is to f/u with Janese Banks in regards to her ct scan.

## 2019-03-30 NOTE — Progress Notes (Signed)
I connected with Andrea Lloyd on 03/30/19 at 11:45 AM EDT by video enabled telemedicine visit and verified that I am speaking with the correct person using two identifiers.   I discussed the limitations, risks, security and privacy concerns of performing an evaluation and management service by telemedicine and the availability of in-person appointments. I also discussed with the patient that there may be a patient responsible charge related to this service. The patient expressed understanding and agreed to proceed.  Other persons participating in the visit and their role in the encounter:  none  Patient's location:  home Provider's location:  home  Chief Complaint: Hospital discharge follow-up to discuss results of CT abdomen and further management  Diagnosis: Prior history of follicular lymphoma under observation  History of present illness: patient is a 63 year old female who was seen by [facility] for evaluation of left lower quadrant abdominal pain which has been ongoing for the last few monthsShe has a history of hysterectomy/sacral colpopexy/ vaginal mesh in 2014.   2. This was followed by a CT of the abdomen which showed: Multiple enlarged abdominal and left common iliac nodes. Findings are worrisome for either metastatic adenopathy from a abdominal primary versus lymphoproliferative disorder. Consider further investigation with PET-CT and tissue sampling.  3. PET/CT scan from 01/08/2017 showed:Hypermetabolic upper abdominal and retroperitoneal lymph nodes, measuring up to 1.9 cm short axis, worrisome for nodal metastases versus lymphoma.  Hypermetabolic 7 mm short axis node at the left thoracic inlet, worrisome for lymphatic spread via the thoracic duct.  4. Tumor markers including CEA and CA-19-9 was normal at 1 and 8 respectively.  5. She is otherwise doing well and denies any complaints of unintentional weight loss, loss of appetite, fevers or chills or  drenching night sweats. Her abdominal pain is mostly associated with eating and sometimes comes on a day later. It is mainly in her left lower quadrant and is a dull aching pain but sometimes she seems to have pain in the epigastrium and right upper quadrant  6. Patient underwent IR guided retroperitoneal LN biopsy which showed : DIAGNOSIS:  A. LYMPH NODE, LEFT RETROPERITONEAL; CT-GUIDED BIOPSY:  - FOLLICULAR LYMPHOMA, GRADE 1-2.  7. Bone marrow biopsy negative for lymphoma. Normal karyotype and cytogenetics  She remains under observation and has not required any treatment so far   Interval history patient was last seen by me in July 2019 and was supposed to follow-up with me next month.  She presented to the ER with symptoms of left upper quadrant abdominal pain.  She also reports pain when she takes a deep breath.  Reports that her appetite is good but over the last 8 days she has been having symptoms of early satiety and abdominal fullness   Review of Systems  Constitutional: Positive for malaise/fatigue. Negative for chills, fever and weight loss.  HENT: Negative for congestion, ear discharge and nosebleeds.   Eyes: Negative for blurred vision.  Respiratory: Negative for cough, hemoptysis, sputum production, shortness of breath and wheezing.        Pleuritic chest pain  Cardiovascular: Negative for chest pain, palpitations, orthopnea and claudication.  Gastrointestinal: Positive for abdominal pain. Negative for blood in stool, constipation, diarrhea, heartburn, melena, nausea and vomiting.  Genitourinary: Negative for dysuria, flank pain, frequency, hematuria and urgency.  Musculoskeletal: Negative for back pain, joint pain and myalgias.  Skin: Negative for rash.  Neurological: Negative for dizziness, tingling, focal weakness, seizures, weakness and headaches.  Endo/Heme/Allergies: Does not bruise/bleed easily.  Psychiatric/Behavioral: Negative  for depression and suicidal ideas.  The patient does not have insomnia.     Allergies  Allergen Reactions  . Adhesive [Tape] Rash    Bandaids    Past Medical History:  Diagnosis Date  . Asthma   . Family history of adverse reaction to anesthesia    Father - slow to wake  . GERD (gastroesophageal reflux disease)   . Gravida 4 para 4     Past Surgical History:  Procedure Laterality Date  . ABDOMINAL HYSTERECTOMY     06 01 2013  . BLADDER SURGERY    . COLONOSCOPY WITH PROPOFOL N/A 02/25/2017   Procedure: COLONOSCOPY WITH PROPOFOL;  Surgeon: Lucilla Lame, MD;  Location: [place];  Service: Endoscopy;  Laterality: N/A;  . CYSTOSCOPY W/ URETERAL STENT PLACEMENT Left 03/28/2019   Procedure: CYSTOSCOPY WITH RETROGRADE PYELOGRAM/URETERAL STENT PLACEMENT;  Surgeon: Abbie Sons, MD;  Location: [facility];  Service: Urology;  Laterality: Left;  . POLYPECTOMY  02/25/2017   Procedure: POLYPECTOMY;  Surgeon: Lucilla Lame, MD;  Location: [place];  Service: Endoscopy;;  . ROTATOR CUFF REPAIR Right   . TONSILLECTOMY  1972    Social History   Socioeconomic History  . Marital status: Married    Spouse name: Not on file  . Number of children: Not on file  . Years of education: Not on file  . Highest education level: Not on file  Occupational History  . Not on file  Social Needs  . Financial resource strain: Not on file  . Food insecurity:    Worry: Not on file    Inability: Not on file  . Transportation needs:    Medical: Not on file    Non-medical: Not on file  Tobacco Use  . Smoking status: Never Smoker  . Smokeless tobacco: Never Used  Substance and Sexual Activity  . Alcohol use: No  . Drug use: No  . Sexual activity: Yes  Lifestyle  . Physical activity:    Days per week: Not on file    Minutes per session: Not on file  . Stress: Not on file  Relationships  . Social connections:    Talks on phone: Not on file    Gets together: Not on file    Attends religious service: Not on file     Active member of club or organization: Not on file    Attends meetings of clubs or organizations: Not on file    Relationship status: Not on file  . Intimate partner violence:    Fear of current or ex partner: Not on file    Emotionally abused: Not on file    Physically abused: Not on file    Forced sexual activity: Not on file  Other Topics Concern  . Not on file  Social History Narrative  . Not on file    Family History  Problem Relation Age of Onset  . Alcohol abuse Maternal Grandfather   . Healthy Mother   . Cancer Father   . Healthy Sister   . Healthy Brother   . Breast cancer Maternal Aunt   . Hyperlipidemia Neg Hx   . Heart disease Neg Hx   . Diabetes Neg Hx      Current Outpatient Medications:  .  albuterol (PROVENTIL HFA) 108 (90 Base) MCG/ACT inhaler, Inhale 2 puffs into the lungs every 6 (six) hours as needed for wheezing or shortness of breath. , Disp: , Rfl:  .  allopurinol (ZYLOPRIM) 100 MG tablet,  Take 1 tablet (100 mg total) by mouth daily for 30 days., Disp: 30 tablet, Rfl: 0 .  Cholecalciferol (VITAMIN D3 PO), Take 20,000 mcg by mouth daily., Disp: , Rfl:  .  Menaquinone-7 (VITAMIN K2 PO), Take 200 mcg by mouth daily., Disp: , Rfl:  .  Multiple Vitamin (MULTI-VITAMIN DAILY PO), Take 1 tablet by mouth daily. , Disp: , Rfl:  .  oxybutynin (DITROPAN) 5 MG tablet, Take 1 tablet (5 mg total) by mouth every 8 (eight) hours as needed for bladder spasms., Disp: 30 tablet, Rfl: 0 .  Probiotic Product (PROBIOTIC PO), Take by mouth daily., Disp: , Rfl:   Dg Abd 1 View  Result Date: 03/28/2019 CLINICAL DATA:  Left ureteral stent placement. FLUOROSCOPY TIME:  42 seconds. Images: 2 EXAM: ABDOMEN - 1 VIEW COMPARISON:  CT scan Mar 28, 2019 FINDINGS: By the end of the study, a stent has been placed in the left ureter. Only the proximal aspect of the stent is identified. The proximal portion of the stent has not formed a complete pigtail. Hydronephrosis remains on the final  image of the study. IMPRESSION: Left ureteral stent placement as above. Electronically Signed   By: Dorise Bullion III M.D   On: 03/28/2019 17:42   Ct Abdomen Pelvis W Contrast  Result Date: 03/28/2019 CLINICAL DATA:  Left lower quadrant abdominal pain and peritoneal signs on exam. Prior hysterectomy. History of follicular lymphoma. EXAM: CT ABDOMEN AND PELVIS WITH CONTRAST TECHNIQUE: Multidetector CT imaging of the abdomen and pelvis was performed using the standard protocol following bolus administration of intravenous contrast. CONTRAST:  181m OMNIPAQUE IOHEXOL 300 MG/ML  SOLN COMPARISON:  12/31/2016 CT abdomen/pelvis and 01/08/2017 PET-CT. FINDINGS: Lower chest: Trace dependent left pleural effusion. New bilateral retrocrural adenopathy measuring up to 2.2 cm (series 2/image 23). Hepatobiliary: Normal liver size. No liver mass. Distended gallbladder. No gallbladder wall thickening. No radiopaque cholelithiasis. Minimal central intrahepatic biliary ductal dilatation, unchanged. CBD diameter 7 mm, slightly dilated, unchanged. No radiopaque choledocholithiasis. Pancreas: Normal, with no mass or duct dilation. Spleen: Normal size. No mass. Adrenals/Urinary Tract: Normal adrenals. New moderate left hydronephrosis with mild left perinephric edema. No right hydronephrosis. No renal masses. Normal bladder. Stomach/Bowel: Normal non-distended stomach. Normal caliber small bowel with no small bowel wall thickening. Normal appendix. Scattered mild colonic diverticulosis, with no large bowel wall thickening or significant pericolonic fat stranding. Vascular/Lymphatic: Atherosclerotic nonaneurysmal abdominal aorta. Patent portal, splenic, hepatic and renal veins. Massive confluent retroperitoneal adenopathy throughout pericaval, aortocaval and left periaortic chains, significantly increased from prior. Representative 7.3 cm short axis left para-aortic node (series 2/image 36), increased from 1.6 cm. Representative  short axis 5.5 cm aortocaval node (series 2/image 32), increased from 2.1 cm. Reproductive: Status post hysterectomy, with no abnormal findings at the vaginal cuff. No adnexal mass. Other: No ascites. No pneumoperitoneum. Large subcutaneous 8.1 x 4.3 cm collection in the left gluteal region with thick wall (series 2/image 66), new from prior. Musculoskeletal: No aggressive appearing focal osseous lesions. Mild thoracolumbar spondylosis. IMPRESSION: 1. 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 2. New moderate left hydronephrosis due to extrinsic obstruction from the progressive adenopathy. 3. Trace dependent left pleural effusion. 4. Mild colonic diverticulosis, with no evidence of acute diverticulitis. No evidence of bowel obstruction. 5. Large subcutaneous collection in the left gluteal region with thick wall, nonspecific, potentially posttraumatic hematoma. Infected collection cannot be excluded. 6.  Aortic Atherosclerosis (ICD10-I70.0). Electronically  Signed   By: Ilona Sorrel M.D.   On: 03/28/2019 10:40   Dg C-arm 1-60 Min  Result Date: 03/28/2019 CLINICAL DATA:  Left ureteral stent placement. FLUOROSCOPY TIME:  42 seconds. Images: 2 EXAM: ABDOMEN - 1 VIEW COMPARISON:  CT scan Mar 28, 2019 FINDINGS: By the end of the study, a stent has been placed in the left ureter. Only the proximal aspect of the stent is identified. The proximal portion of the stent has not formed a complete pigtail. Hydronephrosis remains on the final image of the study. IMPRESSION: Left ureteral stent placement as above. Electronically Signed   By: Dorise Bullion III M.D   On: 03/28/2019 17:42    No images are attached to the encounter.   CMP Latest Ref Rng & Units 03/29/2019  Glucose 70 - 99 mg/dL 126(H)  BUN 8 - 23 mg/dL 11  Creatinine 0.44 - 1.00 mg/dL 0.74  Sodium 135 - 145  mmol/L 141  Potassium 3.5 - 5.1 mmol/L 4.2  Chloride 98 - 111 mmol/L 107  CO2 22 - 32 mmol/L 23  Calcium 8.9 - 10.3 mg/dL 8.6(L)  Total Protein 6.5 - 8.1 g/dL -  Total Bilirubin 0.3 - 1.2 mg/dL -  Alkaline Phos 38 - 126 U/L -  AST 15 - 41 U/L -  ALT 0 - 44 U/L -   CBC Latest Ref Rng & Units 03/29/2019  WBC 4.0 - 10.5 K/uL 6.3  Hemoglobin 12.0 - 15.0 g/dL 12.6  Hematocrit 36.0 - 46.0 % 38.9  Platelets 150 - 400 K/uL 266     Observation/objective: Appears in no acute distress of a video visit today.  Breathing is nonlabored  Assessment and plan: Patient is a 63 year old female with a history of follicular lymphoma that was diagnosed in 2018 and did not require treatment at that time now presenting with abdominal pain and left-sided pleuritic chest pain with CT scan showing significant progression of disease  I have reviewed CT abdomen and pelvis images independently and discussed findings with the patient.Patient had non-bulky adenopathy back in 2018 in the abdomen which was biopsy-proven follicular lymphoma which did not require treatment back then.  She now presents with pleuritic chest pain as well as abdominal fullness.  CT abdomen shows considerable progression of her abdominal adenopathy which is concerning for progression of her pre-existing follicular lymphoma versus transformation to a higher lymphoma such as a diffuse large B-cell lymphoma.  I have also discussed her case with Dr. Kathlene Cote.  She has moderate left hydronephrosis but she is still excreting equally on both sides of her kidney.  She however needs urgent work-up of her ongoing adenopathy and I will start off with getting a PET CT scan ASAP.  I will also schedule her for a CT-guided core biopsy of her intra-abdominal lymph nodes and I would ideally like to biopsy the lymph node with the highest uptake on the PET scan.  Patient was seen by Dr. Tasia Catchings as an inpatient and given her elevated uric acid she was started on allopurinol  which she will continue.  Her CBC with differential is presently normal.  Patient will also need a bone marrow biopsy to complete staging work-up which she will get on the same day as her core biopsy later this week.  Patient will get hepatitis labs checked on the day of her PET scan  I will tentatively see her back in 10 days time to start her first cycle of chemotherapy which will depend on whether she  has follicular lymphoma or diffuse large B-cell lymphoma.  I will start her on empiric steroids after biopsy is completed at 1 mg/kg prednisone for 5 days.  I suspect that her pleuritic chest pain is likely secondary to retrocrural adenopathy.   Follow-up instructions: Return to clinic in 1 week with CBC with differential, CMP and uric acid to start for cycle of chemotherapy R-CHOP versus BR depending on pathology diagnosis.  Treatment will be given with curative intent  I discussed the assessment and treatment plan with the patient. The patient was provided an opportunity to ask questions and all were answered. The patient agreed with the plan and demonstrated an understanding of the instructions.   The patient was advised to call back or seek an in-person evaluation if the symptoms worsen or if the condition fails to improve as anticipated.  I provided 30 minutes of face-to-face video visit time during this encounter, and > 50% was spent counseling as documented under my assessment & plan.  Visit Diagnosis: 1. Follicular lymphoma grade I of intra-abdominal lymph nodes ([place])   2. Abnormal CT scan   3. Goals of care, counseling/discussion     Dr. Randa Evens, MD, MPH [facility] [phone] 03/30/2019 1:16 PM

## 2019-03-30 NOTE — Discharge Summary (Signed)
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 Asthma   . Family history of adverse reaction to anesthesia    Father - slow to wake  . GERD (gastroesophageal reflux disease)   . Gravida 4 para 4     HOSPITAL COURSE:   63 year old female with past medical history of lymphadenopathy secondary to lymphoma, asthma, GERD who presented to the hospital due to left upper quadrant abdominal pain and noted to have a CT scan of the abdomen pelvis showing extrinsic obstruction of the left-sided ureter and left-sided hydronephrosis.  1.  Left upper quadrant abdominal pain- secondary to left-sided extrinsic obstruction of the ureter with hydronephrosis from bulky lymphadenopathy from underlying history of lymphoma. -Patient was admitted to the hospital started on supportive care with pain control with IV Toradol.  Patient was voiding well and her renal function was stable.  Urology consult was obtained and patient underwent cystoscopy with left-sided ureteral stent placement. -Post procedure patient had some bladder spasms for which she was started on oxybutynin.  She is otherwise clinically stable and feels much better and therefore not being discharged home with outpatient follow-up with oncology and urology.  2.   History of lymphoma- this has been in remission and patient is followed by Dr. Janese Banks.  Patient has not had a recurrence of her low lymphadenopathy and therefore needs close follow-up with oncology as an outpatient.  While in the hospital patient was seen by Dr. Tasia Catchings and was noted to have a elevated uric acid level and patient empirically has been discharged on oral allopurinol and outpatient follow-up with oncology.  3.  Asthma-no acute exacerbation.  Continue albuterol inhaler as needed.   DISCHARGE CONDITIONS:   Stable.   CONSULTS OBTAINED:  Treatment Team:  Abbie Sons, MD  DRUG ALLERGIES:   Allergies  Allergen Reactions  . Adhesive [Tape] Rash    Bandaids    DISCHARGE MEDICATIONS:   Allergies as of 03/29/2019      Reactions   Adhesive [tape] Rash   Bandaids      Medication List    STOP taking these medications   ciprofloxacin 500 MG tablet Commonly known as:  CIPRO   metroNIDAZOLE 500 MG tablet Commonly known as:  FLAGYL     TAKE these medications   allopurinol 100 MG tablet Commonly known as:  ZYLOPRIM Take 1 tablet (100 mg total) by mouth daily for 30 days.   MULTI-VITAMIN DAILY PO Take 1 tablet by mouth daily.   oxybutynin 5 MG tablet Commonly known as:  DITROPAN Take 1 tablet (5 mg total) by mouth every 8 (eight) hours as needed for bladder spasms.   PROBIOTIC PO Take by mouth daily.   Proventil HFA 108 (90 Base) MCG/ACT inhaler Generic drug:  albuterol Inhale 2 puffs into the lungs every 6 (six) hours as  needed for wheezing or shortness of breath.   VITAMIN D3 PO Take 20,000 mcg by mouth daily.   VITAMIN K2 PO Take 200 mcg by mouth daily.         DISCHARGE INSTRUCTIONS:   DIET:  Regular diet  DISCHARGE CONDITION:  Stable  ACTIVITY:  Activity as tolerated  OXYGEN:  Home Oxygen: No.   Oxygen Delivery: room air  DISCHARGE LOCATION:  home   If you experience worsening of your admission symptoms, develop shortness of  breath, life threatening emergency, suicidal or homicidal thoughts you must seek medical attention immediately by calling 911 or calling your MD immediately  if symptoms less severe.  You Must read complete instructions/literature along with all the possible adverse reactions/side effects for all the Medicines you take and that have been prescribed to you. Take any new Medicines after you have completely understood and accpet all the possible adverse reactions/side effects.   Please note  You were cared for by a hospitalist during your hospital stay. If you have any questions about your discharge medications or the care you received while you were in the hospital after you are discharged, you can call the unit and asked to speak with the hospitalist on call if the hospitalist that took care of you is not available. Once you are discharged, your primary care physician will handle any further medical issues. Please note that NO REFILLS for any discharge medications will be authorized once you are discharged, as it is imperative that you return to your primary care physician (or establish a relationship with a primary care physician if you do not have one) for your aftercare needs so that they can reassess your need for medications and monitor your lab values.     Today   No acute events overnight, status post left-sided ureteral stent placement and still having some bladder spasms but overall feels better since admission.  Patient is voiding well, creatinine is stable.  Will discharge home today.  VITAL SIGNS:  Blood pressure 110/64, pulse 66, temperature 97.8 F (36.6 C), temperature source Oral, resp. rate 18, height 5\' 6"  (1.676 m), weight 82.6 kg, SpO2 95 %.  I/O:  No intake or output data in the 24 hours ending 03/30/19 1644  PHYSICAL EXAMINATION:  GENERAL:  63 y.o.-year-old patient lying in the bed with no acute distress.  EYES: Pupils equal, round, reactive to light and accommodation. No  scleral icterus. Extraocular muscles intact.  HEENT: Head atraumatic, normocephalic. Oropharynx and nasopharynx clear.  NECK:  Supple, no jugular venous distention. No thyroid enlargement, no tenderness.  LUNGS: Normal breath sounds bilaterally, no wheezing, rales,rhonchi. No use of accessory muscles of respiration.  CARDIOVASCULAR: S1, S2 normal. No murmurs, rubs, or gallops.  ABDOMEN: Soft, non-tender, non-distended. Bowel sounds present. No organomegaly or mass.  EXTREMITIES: No pedal edema, cyanosis, or clubbing.  NEUROLOGIC: Cranial nerves II through XII are intact. No focal motor or sensory defecits b/l.  PSYCHIATRIC: The patient is alert and oriented x 3.  SKIN: No obvious rash, lesion, or ulcer.   DATA REVIEW:   CBC Recent Labs  Lab 03/29/19 0417  WBC 6.3  HGB 12.6  HCT 38.9  PLT 266    Chemistries  Recent Labs  Lab 03/28/19 0905 03/29/19 0417  NA 141 141  K 4.0 4.2  CL 104 107  CO2 26 23  GLUCOSE 96 126*  BUN 8 11  CREATININE 0.79 0.74  CALCIUM 9.5 8.6*  AST 27  --   ALT  29  --   ALKPHOS 73  --   BILITOT 1.5*  --     Cardiac Enzymes Recent Labs  Lab 03/28/19 0905  TROPONINI <0.03    Microbiology Results  Results for orders placed or performed during the hospital encounter of 03/28/19  SARS Coronavirus 2 (CEPHEID - Performed in [facility] hospital lab), Hosp Order     Status: None   Collection Time: 03/28/19 12:14 PM  Result Value Ref Range Status   SARS Coronavirus 2 NEGATIVE NEGATIVE Final    Comment: (NOTE) If result is NEGATIVE SARS-CoV-2 target nucleic acids are NOT DETECTED. The SARS-CoV-2 RNA is generally detectable in upper and lower  respiratory specimens during the acute phase of infection. The lowest  concentration of SARS-CoV-2 viral copies this assay can detect is 250  copies / mL. A negative result does not preclude SARS-CoV-2 infection  and should not be used as the sole basis for treatment or other  patient management decisions.   A negative result may occur with  improper specimen collection / handling, submission of specimen other  than nasopharyngeal swab, presence of viral mutation(s) within the  areas targeted by this assay, and inadequate number of viral copies  (<250 copies / mL). A negative result must be combined with clinical  observations, patient history, and epidemiological information. If result is POSITIVE SARS-CoV-2 target nucleic acids are DETECTED. The SARS-CoV-2 RNA is generally detectable in upper and lower  respiratory specimens dur ing the acute phase of infection.  Positive  results are indicative of active infection with SARS-CoV-2.  Clinical  correlation with patient history and other diagnostic information is  necessary to determine patient infection status.  Positive results do  not rule out bacterial infection or co-infection with other viruses. If result is PRESUMPTIVE POSTIVE SARS-CoV-2 nucleic acids MAY BE PRESENT.   A presumptive positive result was obtained on the submitted specimen  and confirmed on repeat testing.  While 2019 novel coronavirus  (SARS-CoV-2) nucleic acids may be present in the submitted sample  additional confirmatory testing may be necessary for epidemiological  and / or clinical management purposes  to differentiate between  SARS-CoV-2 and other Sarbecovirus currently known to infect humans.  If clinically indicated additional testing with an alternate test  methodology [phone]) is advised. The SARS-CoV-2 RNA is generally  detectable in upper and lower respiratory sp ecimens during the acute  phase of infection. The expected result is Negative. Fact Sheet for Patients:  StrictlyIdeas.no Fact Sheet for Healthcare Providers: BankingDealers.co.za This test is not yet approved or cleared by the [place] FDA and has been authorized for detection and/or diagnosis of SARS-CoV-2 by FDA under an Emergency Use  Authorization (EUA).  This EUA will remain in effect (meaning this test can be used) for the duration of the COVID-19 declaration under Section 564(b)(1) of the Act, 21 U.S.C. section 360bbb-3(b)(1), unless the authorization is terminated or revoked sooner. Performed at [facility], [address] [place], [place] [address]     RADIOLOGY:  No results found.    Management plans discussed with the patient, family and they are in agreement.  CODE STATUS:  Code Status History    Date Active Date Inactive Code Status Order ID Comments User Context   03/28/2019 1624 03/29/2019 1532 Full Code [id number]  Henreitta Leber, MD Inpatient      TOTAL TIME TAKING CARE OF THIS PATIENT: 40 minutes.    Henreitta Leber M.D on 03/30/2019 at 4:44 PM  Between 7am to 6pm - Pager - [phone]  After 6pm go to [email or web address] - Proofreader  [facility]  Office  [phone]  CC: Primary care physician; Patient, [name]

## 2019-03-31 ENCOUNTER — Other Ambulatory Visit: Payer: Self-pay | Admitting: Oncology

## 2019-03-31 ENCOUNTER — Other Ambulatory Visit: Payer: Self-pay | Admitting: *Deleted

## 2019-03-31 ENCOUNTER — Telehealth: Payer: Self-pay | Admitting: *Deleted

## 2019-03-31 DIAGNOSIS — C8203 Follicular lymphoma grade I, intra-abdominal lymph nodes: Secondary | ICD-10-CM

## 2019-03-31 DIAGNOSIS — R9389 Abnormal findings on diagnostic imaging of other specified body structures: Secondary | ICD-10-CM

## 2019-03-31 LAB — HIV ANTIBODY (ROUTINE TESTING W REFLEX): HIV Screen 4th Generation wRfx: NONREACTIVE

## 2019-03-31 NOTE — Telephone Encounter (Signed)
Called patient to let her know that she will have bx of lymph node and bone marrow bx on same day 6/5 arrival at 8:30 for 9:30 bx.  Pt to be NPO after midnight the night before.  Must have a driver . If any meds to take please take it with sip of water. Pt. Agreeable to the appts and instructions above.

## 2019-03-31 NOTE — Progress Notes (Unsigned)
img 

## 2019-04-02 ENCOUNTER — Inpatient Hospital Stay: Payer: BLUE CROSS/BLUE SHIELD

## 2019-04-02 ENCOUNTER — Other Ambulatory Visit: Payer: Self-pay | Admitting: Radiology

## 2019-04-02 ENCOUNTER — Other Ambulatory Visit: Payer: Self-pay | Admitting: Oncology

## 2019-04-02 ENCOUNTER — Other Ambulatory Visit: Payer: Self-pay

## 2019-04-02 ENCOUNTER — Ambulatory Visit
Admission: RE | Admit: 2019-04-02 | Discharge: 2019-04-02 | Disposition: A | Discharge status: Home / Self Care | Payer: BLUE CROSS/BLUE SHIELD | Source: Ambulatory Visit | Attending: Oncology | Admitting: Oncology

## 2019-04-02 ENCOUNTER — Other Ambulatory Visit: Payer: Self-pay | Admitting: *Deleted

## 2019-04-02 ENCOUNTER — Telehealth: Payer: Self-pay | Admitting: *Deleted

## 2019-04-02 DIAGNOSIS — C8203 Follicular lymphoma grade I, intra-abdominal lymph nodes: Secondary | ICD-10-CM | POA: Insufficient documentation

## 2019-04-02 DIAGNOSIS — J90 Pleural effusion, not elsewhere classified: Secondary | ICD-10-CM | POA: Diagnosis not present

## 2019-04-02 DIAGNOSIS — R590 Localized enlarged lymph nodes: Secondary | ICD-10-CM | POA: Diagnosis not present

## 2019-04-02 LAB — GLUCOSE, CAPILLARY: Glucose-Capillary: 95 mg/dL (ref 70–99)

## 2019-04-02 MED ORDER — FLUDEOXYGLUCOSE F - 18 (FDG) INJECTION
10.1300 | Freq: Once | INTRAVENOUS | Status: AC | PRN
  Administered 2019-04-02: 10.13 via INTRAVENOUS

## 2019-04-02 NOTE — Telephone Encounter (Signed)
Called patient and got her voicemail and left message that she is going to have portacath placed on 6/8 Monday.  With this procedure it requires to have covid testing before the procedure. Patient is going to have bone marrow bx and lymph node bx tom. So after she gets done with the procedure she will need to get in her car and [facility] and there is a sign for pre-op testing and she will drive under covered area where is staff waiting outside in order to do a covid test. You will stay in car and it will be done in a few min. And that will be done and tested to make sure she is safe to have port inserted. I gave her my direct number to call me with any questions and I will call specials in am to remind them of this before she has procedure tom. am

## 2019-04-03 ENCOUNTER — Other Ambulatory Visit
Admission: RE | Admit: 2019-04-03 | Discharge: 2019-04-03 | Disposition: A | Discharge status: Home / Self Care | Payer: BLUE CROSS/BLUE SHIELD | Source: Ambulatory Visit | Attending: Oncology | Admitting: Oncology

## 2019-04-03 ENCOUNTER — Other Ambulatory Visit: Payer: Self-pay | Admitting: Oncology

## 2019-04-03 ENCOUNTER — Other Ambulatory Visit: Payer: Self-pay | Admitting: Student

## 2019-04-03 ENCOUNTER — Other Ambulatory Visit (HOSPITAL_COMMUNITY)
Admission: RE | Admit: 2019-04-03 | Disposition: A | Discharge status: Home / Self Care | Payer: BLUE CROSS/BLUE SHIELD | Source: Ambulatory Visit | Attending: Oncology | Admitting: Oncology

## 2019-04-03 ENCOUNTER — Ambulatory Visit
Admission: RE | Admit: 2019-04-03 | Discharge: 2019-04-03 | Disposition: A | Discharge status: Home / Self Care | Payer: BLUE CROSS/BLUE SHIELD | Source: Ambulatory Visit | Attending: Oncology | Admitting: Oncology

## 2019-04-03 DIAGNOSIS — R9389 Abnormal findings on diagnostic imaging of other specified body structures: Secondary | ICD-10-CM

## 2019-04-03 DIAGNOSIS — Z79899 Other long term (current) drug therapy: Secondary | ICD-10-CM | POA: Diagnosis not present

## 2019-04-03 DIAGNOSIS — C8218 Follicular lymphoma grade II, lymph nodes of multiple sites: Secondary | ICD-10-CM | POA: Insufficient documentation

## 2019-04-03 DIAGNOSIS — C8203 Follicular lymphoma grade I, intra-abdominal lymph nodes: Secondary | ICD-10-CM | POA: Insufficient documentation

## 2019-04-03 DIAGNOSIS — Z1159 Encounter for screening for other viral diseases: Secondary | ICD-10-CM | POA: Diagnosis not present

## 2019-04-03 LAB — HEPATITIS C ANTIBODY: HCV Ab: <0.1 s/co ratio (ref 0.0–0.9)

## 2019-04-03 LAB — CBC WITH DIFFERENTIAL/PLATELET
Abs Immature Granulocytes: 0.01 10*3/uL (ref 0.00–0.07)
Basophils Absolute: 0.1 10*3/uL (ref 0.0–0.1)
Basophils Relative: 1 %
Eosinophils Absolute: 0.3 10*3/uL (ref 0.0–0.5)
Eosinophils Relative: 4 %
HCT: 41.7 % (ref 36.0–46.0)
Hemoglobin: 13.8 g/dL (ref 12.0–15.0)
Immature Granulocytes: 0 %
Lymphocytes Relative: 14 %
Lymphs Abs: 1.1 10*3/uL (ref 0.7–4.0)
MCH: 28.2 pg (ref 26.0–34.0)
MCHC: 33.1 g/dL (ref 30.0–36.0)
MCV: 85.1 fL (ref 80.0–100.0)
Monocytes Absolute: 0.5 10*3/uL (ref 0.1–1.0)
Monocytes Relative: 7 %
Neutro Abs: 5.7 10*3/uL (ref 1.7–7.7)
Neutrophils Relative %: 74 %
Platelets: 268 10*3/uL (ref 150–400)
RBC: 4.9 MIL/uL (ref 3.87–5.11)
RDW: 13.4 % (ref 11.5–15.5)
WBC: 7.6 10*3/uL (ref 4.0–10.5)
nRBC: 0 % (ref 0.0–0.2)

## 2019-04-03 LAB — HEPATITIS B SURFACE ANTIBODY, QUANTITATIVE: Hep B S AB Quant (Post): <3.1 m[IU]/mL — ABNORMAL LOW (ref >9.9)

## 2019-04-03 LAB — PROTIME-INR
INR: 0.9 (ref 0.8–1.2)
Prothrombin Time: 12.5 seconds (ref 11.4–15.2)

## 2019-04-03 LAB — HEPATITIS B SURFACE ANTIGEN: Hepatitis B Surface Ag: NEGATIVE

## 2019-04-03 LAB — HEPATITIS B CORE ANTIBODY, TOTAL: Hep B Core Total Ab: NEGATIVE

## 2019-04-03 MED ORDER — ALLOPURINOL 300 MG PO TABS: 300.0000 mg | ORAL_TABLET | Freq: Two times a day (BID) | ORAL | 2 refills | Status: DC

## 2019-04-03 MED ORDER — MIDAZOLAM HCL 2 MG/2ML IJ SOLN
INTRAMUSCULAR | Status: AC | PRN
  Administered 2019-04-03 (×4): 1 mg via INTRAVENOUS

## 2019-04-03 MED ORDER — FENTANYL CITRATE (PF) 100 MCG/2ML IJ SOLN
INTRAMUSCULAR | Status: AC | PRN
  Administered 2019-04-03 (×2): 25 ug via INTRAVENOUS
  Administered 2019-04-03: 50 ug via INTRAVENOUS

## 2019-04-03 MED ORDER — CEFAZOLIN SODIUM-DEXTROSE 1-4 GM/50ML-% IV SOLN
INTRAVENOUS | Status: AC | PRN
  Administered 2019-04-03: 2 g via INTRAVENOUS

## 2019-04-03 MED ORDER — MIDAZOLAM HCL 5 MG/5ML IJ SOLN
INTRAMUSCULAR | Status: AC
  Filled 2019-04-03: qty 5

## 2019-04-03 MED ORDER — FENTANYL CITRATE (PF) 100 MCG/2ML IJ SOLN
INTRAMUSCULAR | Status: AC
  Filled 2019-04-03: qty 4

## 2019-04-03 MED ORDER — HEPARIN SOD (PORK) LOCK FLUSH 100 UNIT/ML IV SOLN
INTRAVENOUS | Status: AC
  Filled 2019-04-03: qty 5

## 2019-04-03 MED ORDER — LIDOCAINE HCL (PF) 1 % IJ SOLN
INTRAMUSCULAR | Status: AC | PRN
  Administered 2019-04-03: 10 mL

## 2019-04-03 MED ORDER — SODIUM CHLORIDE 0.9 % IV SOLN: INTRAVENOUS | Status: DC

## 2019-04-03 NOTE — Progress Notes (Signed)
Patient clinically stable post BMB/Lymph  Node Biopsy per Dr Kathlene Cote. Tolerated well. Received versed 4mg /Fentanyl 100 Mcg iv for procedure. Sinus rhythm per monitor. Called Andrea Lloyd/patient's husband post procedure to give update over phone,questions answerd. Discharge instructions given to husband and patient with questions answered.

## 2019-04-03 NOTE — H&P (Signed)
Chief Complaint: Patient was seen in consultation today for bone marrow biopsy and retroperitoneal lymph node biopsy at the request of Rao,Archana C  Referring Physician(s): Rao,Archana C  Patient Status: [facility] - Out-pt  History of Present Illness: Andrea Lloyd is a 63 y.o. female known to our service from prior CT guided left retroperitoneal LN and bone marrow biopsy procedures in March and May of 2018 demonstrating follicular lymphoma. She was placed under observation.She presented recently with left lower quadrant pain with CT on 03/28/2019 demonstrating significant progression of abdominopelvic RP lymphadenopathy and secondary left hydronephrosis due to extrinsic compression of the ureter by RP lymph nodes. A left ureteral stent was placed by Dr. Bernardo Heater on 5/30. PET scan yesterday demonstrates hypermetabolic enlarged left supraclavicular, retrocrural and retroperitoneal lymphadenopathy. She presents today for both CT guided bone marrow and RP LN biopsy to establish current status of lymphoma. A port placement is scheduled for 04/06/19 with our service.  Past Medical History:  Diagnosis Date   Asthma    Family history of adverse reaction to anesthesia    Father - slow to wake   GERD (gastroesophageal reflux disease)    Gravida 4 para 4     Past Surgical History:  Procedure Laterality Date   ABDOMINAL HYSTERECTOMY     06 01 2013   BLADDER SURGERY     COLONOSCOPY WITH PROPOFOL N/A 02/25/2017   Procedure: COLONOSCOPY WITH PROPOFOL;  Surgeon: Lucilla Lame, MD;  Location: [place];  Service: Endoscopy;  Laterality: N/A;   CYSTOSCOPY W/ URETERAL STENT PLACEMENT Left 03/28/2019   Procedure: CYSTOSCOPY WITH RETROGRADE PYELOGRAM/URETERAL STENT PLACEMENT;  Surgeon: Abbie Sons, MD;  Location: [facility];  Service: Urology;  Laterality: Left;   POLYPECTOMY  02/25/2017   Procedure: POLYPECTOMY;  Surgeon: Lucilla Lame, MD;  Location: [place];  Service:  Endoscopy;;   ROTATOR CUFF REPAIR Right    TONSILLECTOMY  1972    Allergies: Adhesive [tape]  Medications: Prior to Admission medications   Medication Sig Start Date End Date Taking? Authorizing Provider  albuterol (PROVENTIL HFA) 108 (90 Base) MCG/ACT inhaler Inhale 2 puffs into the lungs every 6 (six) hours as needed for wheezing or shortness of breath.  11/19/17  Yes [provider]  allopurinol (ZYLOPRIM) 100 MG tablet Take 1 tablet (100 mg total) by mouth daily for 30 days. 03/30/19 04/29/19 Yes Sainani, Belia Heman, MD  Cholecalciferol (VITAMIN D3 PO) Take 20,000 mcg by mouth daily.   Yes [provider]  Menaquinone-7 (VITAMIN K2 PO) Take 200 mcg by mouth daily.   Yes [provider]  Multiple Vitamin (MULTI-VITAMIN DAILY PO) Take 1 tablet by mouth daily.    Yes [provider]  oxybutynin (DITROPAN) 5 MG tablet Take 1 tablet (5 mg total) by mouth every 8 (eight) hours as needed for bladder spasms. 03/29/19  Yes Henreitta Leber, MD  Probiotic Product (PROBIOTIC PO) Take by mouth daily.   Yes [provider]     Family History  Problem Relation Age of Onset   Alcohol abuse Maternal Grandfather    Healthy Mother    Cancer Father    Healthy Sister    Healthy Brother    Breast cancer Maternal Aunt    Hyperlipidemia Neg Hx    Heart disease Neg Hx    Diabetes Neg Hx     Social History   Socioeconomic History   Marital status: Married    Spouse name: Not on file   Number  of children: Not on file   Years of education: Not on file   Highest education level: Not on file  Occupational History   Not on file  Social Needs   Financial resource strain: Not on file   Food insecurity:    Worry: Not on file    Inability: Not on file   Transportation needs:    Medical: Not on file    Non-medical: Not on file  Tobacco Use   Smoking status: Never Smoker   Smokeless tobacco: Never Used  Substance and Sexual Activity    Alcohol use: No   Drug use: No   Sexual activity: Yes  Lifestyle   Physical activity:    Days per week: Not on file    Minutes per session: Not on file   Stress: Not on file  Relationships   Social connections:    Talks on phone: Not on file    Gets together: Not on file    Attends religious service: Not on file    Active member of club or organization: Not on file    Attends meetings of clubs or organizations: Not on file    Relationship status: Not on file  Other Topics Concern   Not on file  Social History Narrative   Not on file    ECOG Status: 1 - Symptomatic but completely ambulatory  Review of Systems: A 12 point ROS discussed and pertinent positives are indicated in the HPI above.  All other systems are negative.  Review of Systems  Constitutional: Negative.   HENT: Negative.   Respiratory: Negative.   Cardiovascular: Negative.   Gastrointestinal: Positive for abdominal pain. Negative for blood in stool, constipation, diarrhea, nausea and vomiting.       Mild left sided abdominal pain.  Genitourinary: Negative.   Musculoskeletal: Negative.   Neurological: Negative.     Vital Signs: BP 118/90    Pulse 85    Temp 97.8 F (36.6 C) (Oral)    Resp 20    Ht _0  (1.676 m)    Wt 82.6 kg    SpO2 98%    BMI 29.38 kg/m   Physical Exam Vitals signs reviewed.  Constitutional:      General: She is not in acute distress.    Appearance: Normal appearance. She is not ill-appearing or toxic-appearing.  HENT:     Head: Normocephalic and atraumatic.     Mouth/Throat:     Mouth: Mucous membranes are moist.     Pharynx: Oropharynx is clear. No oropharyngeal exudate or posterior oropharyngeal erythema.  Neck:     Musculoskeletal: No muscular tenderness.     Comments: Palpable enlarged lymph node in left lower neck/supraclavicular region just deep th left SCM. Cardiovascular:     Rate and Rhythm: Normal rate and regular rhythm.     Heart sounds: Normal heart  sounds. No murmur. No friction rub. No gallop.   Pulmonary:     Effort: Pulmonary effort is normal. No respiratory distress.     Breath sounds: Normal breath sounds. No stridor. No wheezing, rhonchi or rales.  Abdominal:     General: Bowel sounds are normal.     Palpations: Abdomen is soft.     Tenderness: There is abdominal tenderness. There is rebound. There is no guarding.  Musculoskeletal:        General: No swelling.  Skin:    General: Skin is warm and dry.  Neurological:     Mental Status: She is alert  and oriented to person, place, and time.     Imaging: Dg Abd 1 View  Result Date: 03/28/2019 CLINICAL DATA:  Left ureteral stent placement. FLUOROSCOPY TIME:  42 seconds. Images: 2 EXAM: ABDOMEN - 1 VIEW COMPARISON:  CT scan Mar 28, 2019 FINDINGS: By the end of the study, a stent has been placed in the left ureter. Only the proximal aspect of the stent is identified. The proximal portion of the stent has not formed a complete pigtail. Hydronephrosis remains on the final image of the study. IMPRESSION: Left ureteral stent placement as above. Electronically Signed   By: Dorise Bullion III M.D   On: 03/28/2019 17:42   Ct Abdomen Pelvis W Contrast  Result Date: 03/28/2019 CLINICAL DATA:  Left lower quadrant abdominal pain and peritoneal signs on exam. Prior hysterectomy. History of follicular lymphoma. EXAM: CT ABDOMEN AND PELVIS WITH CONTRAST TECHNIQUE: Multidetector CT imaging of the abdomen and pelvis was performed using the standard protocol following bolus administration of intravenous contrast. CONTRAST:  167m OMNIPAQUE IOHEXOL 300 MG/ML  SOLN COMPARISON:  12/31/2016 CT abdomen/pelvis and 01/08/2017 PET-CT. FINDINGS: Lower chest: Trace dependent left pleural effusion. New bilateral retrocrural adenopathy measuring up to 2.2 cm (series 2/image 23). Hepatobiliary: Normal liver size. No liver mass. Distended gallbladder. No gallbladder wall thickening. No radiopaque cholelithiasis.  Minimal central intrahepatic biliary ductal dilatation, unchanged. CBD diameter 7 mm, slightly dilated, unchanged. No radiopaque choledocholithiasis. Pancreas: Normal, with no mass or duct dilation. Spleen: Normal size. No mass. Adrenals/Urinary Tract: Normal adrenals. New moderate left hydronephrosis with mild left perinephric edema. No right hydronephrosis. No renal masses. Normal bladder. Stomach/Bowel: Normal non-distended stomach. Normal caliber small bowel with no small bowel wall thickening. Normal appendix. Scattered mild colonic diverticulosis, with no large bowel wall thickening or significant pericolonic fat stranding. Vascular/Lymphatic: Atherosclerotic nonaneurysmal abdominal aorta. Patent portal, splenic, hepatic and renal veins. Massive confluent retroperitoneal adenopathy throughout pericaval, aortocaval and left periaortic chains, significantly increased from prior. Representative 7.3 cm short axis left para-aortic node (series 2/image 36), increased from 1.6 cm. Representative short axis 5.5 cm aortocaval node (series 2/image 32), increased from 2.1 cm. Reproductive: Status post hysterectomy, with no abnormal findings at the vaginal cuff. No adnexal mass. Other: No ascites. No pneumoperitoneum. Large subcutaneous 8.1 x 4.3 cm collection in the left gluteal region with thick wall (series 2/image 66), new from prior. Musculoskeletal: No aggressive appearing focal osseous lesions. Mild thoracolumbar spondylosis. IMPRESSION: 1. 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 2. New moderate left hydronephrosis due to extrinsic obstruction from the progressive adenopathy. 3. Trace dependent left pleural effusion. 4. Mild colonic diverticulosis, with no evidence of acute diverticulitis. No evidence of bowel obstruction. 5. Large subcutaneous collection in the  left gluteal region with thick wall, nonspecific, potentially posttraumatic hematoma. Infected collection cannot be excluded. 6.  Aortic Atherosclerosis (ICD10-I70.0). Electronically Signed   By: JIlona SorrelM.D.   On: 03/28/2019 10:40   Nm Pet Image Restag (ps) Skull Base To Thigh  Result Date: 04/02/2019 CLINICAL DATA:  Subsequent treatment strategy for lymphoma. EXAM: NUCLEAR MEDICINE PET SKULL BASE TO THIGH TECHNIQUE: 10.1 mCi F-18 FDG was injected intravenously. Full-ring PET imaging was performed from the skull base to thigh after the radiotracer. CT data was obtained and used for attenuation correction and anatomic localization. Fasting blood glucose: 95 mg/dl COMPARISON:  PET-CT 01/08/2017. and CT AP from 03/28/2019 FINDINGS: Mediastinal blood pool activity: SUV max  2.2 Liver activity: SUV max 3.7 NECK: Enlarged left supraclavicular lymph node measures 1.9 cm and has an SUV max of 17.05, Deauville criteria 5. Previously this measured 0.7 cm with an SUV max of 4.1. Incidental CT findings: none CHEST: No hypermetabolic mediastinal or hilar nodes. No suspicious pulmonary nodules on the CT scan. Incidental CT findings: Small left pleural effusion.  New. ABDOMEN/PELVIS: No abnormal uptake within the liver, pancreas, or spleen. The spleen measures 10.5 cm in length with SUV max similar to liver activity. Extensive, bulky retroperitoneal lymph nodes identified. Dominant retroperitoneal mass measures 12.3 by 8.7 by 12.3 cm and has an SUV max of 23.9, Deauville criteria 5. On previous exam the largest retroperitoneal lymph node had a short axis of 1.9 cm and an SUV max of 6.4. FDG avid enlarged retrocrural lymph nodes noted measuring 4.0 x 2.5 cm within SUV max of 18.5, Deauville criteria 5. previously this measured 0.9 cm within SUV max of 3.19. Incidental CT findings: Left-sided hydronephrosis is identified with newly placed ureteral stent. SKELETON: No focal hypermetabolic activity to suggest skeletal  metastasis. Incidental CT findings: There is a mass within the left gluteal region which measures 7.5 by 6.1 cm. This is peripherally FDG avid suggesting underlying inflammatory or infectious process. IMPRESSION: 1. Examination compatible with recurrent progressive lymphoma, Deauville criteria 5. 2. Enlarged left supraclavicular, retrocrural and retroperitoneal adenopathy identified. Sites of disease demonstrate significant increase in size and degree of FDG uptake compared with previous. 3. Left-sided hydronephrosis status post ureteral stent placement 4. Persistent left gluteal region subcutaneous fluid collection with peripheral FDG uptake compatible with an inflammatory or infectious process. 5. Small left pleural effusion.  New. Electronically Signed   By: Kerby Moors M.D.   On: 04/02/2019 14:26   Dg C-arm 1-60 Min  Result Date: 03/28/2019 CLINICAL DATA:  Left ureteral stent placement. FLUOROSCOPY TIME:  42 seconds. Images: 2 EXAM: ABDOMEN - 1 VIEW COMPARISON:  CT scan Mar 28, 2019 FINDINGS: By the end of the study, a stent has been placed in the left ureter. Only the proximal aspect of the stent is identified. The proximal portion of the stent has not formed a complete pigtail. Hydronephrosis remains on the final image of the study. IMPRESSION: Left ureteral stent placement as above. Electronically Signed   By: Dorise Bullion III M.D   On: 03/28/2019 17:42    Labs:  CBC: Recent Labs    04/25/18 9024 03/28/19 0905 03/29/19 0417 04/03/19 0912  WBC 8.0 7.6 6.3 7.6  HGB 14.1 14.6 12.6 13.8  HCT 41.0 45.5 38.9 41.7  PLT 262 274 266 268    COAGS: Recent Labs    04/03/19 0912  INR 0.9    BMP: Recent Labs    04/25/18 0854 03/28/19 0905 03/29/19 0417  NA 139 141 141  K 4.5 4.0 4.2  CL 106 104 107  CO2 _0 GLUCOSE 100* 96 126*  BUN _1 CALCIUM 9.7 9.5 8.6*  CREATININE 0.78 0.79 0.74  GFRNONAA >60 >60 >60  GFRAA >60 >60 >60    LIVER FUNCTION TESTS: Recent  Labs    04/25/18 0854 03/28/19 0905  BILITOT 1.3* 1.5*  AST 21 27  ALT 22 29  ALKPHOS 71 73  PROT 7.5 7.9  ALBUMIN 4.4 4.7     Assessment and Plan:  For CT guided bone marrow biopsy and left retroperitoneal lymph node biopsy today. Risks and benefits of both procedures were discussed with the patient and/or patient's  family including, but not limited to bleeding, infection, damage to adjacent structures or low yield requiring additional tests. All of the questions were answered and there is agreement to proceed. Consent signed and in chart.  Thank you for this interesting consult.  I greatly enjoyed meeting Andrea Lloyd and look forward to participating in their care.  A copy of this report was sent to the requesting provider on this date.  Electronically Signed: Azzie Roup, MD 04/03/2019, 9:47 AM     I spent a total of  25 Minutes in face to face in clinical consultation, greater than 50% of which was counseling/coordinating care for bone marrow and lymph node biopsy.

## 2019-04-03 NOTE — Discharge Instructions (Signed)
Moderate Conscious Sedation, Adult, Care After °These instructions provide you with information about caring for yourself after your procedure. Your health care provider may also give you more specific instructions. Your treatment has been planned according to current medical practices, but problems sometimes occur. Call your health care provider if you have any problems or questions after your procedure. °What can I expect after the procedure? °After your procedure, it is common: °· To feel sleepy for several hours. °· To feel clumsy and have poor balance for several hours. °· To have poor judgment for several hours. °· To vomit if you eat too soon. °Follow these instructions at home: °For at least 24 hours after the procedure: ° °· Do not: °? Participate in activities where you could fall or become injured. °? Drive. °? Use heavy machinery. °? Drink alcohol. °? Take sleeping pills or medicines that cause drowsiness. °? Make important decisions or sign legal documents. °? Take care of children on your own. °· Rest. °Eating and drinking °· Follow the diet recommended by your health care provider. °· If you vomit: °? Drink water, juice, or soup when you can drink without vomiting. °? Make sure you have little or no nausea before eating solid foods. °General instructions °· Have a responsible adult stay with you until you are awake and alert. °· Take over-the-counter and prescription medicines only as told by your health care provider. °· If you smoke, do not smoke without supervision. °· Keep all follow-up visits as told by your health care provider. This is important. °Contact a health care provider if: °· You keep feeling nauseous or you keep vomiting. °· You feel light-headed. °· You develop a rash. °· You have a fever. °Get help right away if: °· You have trouble breathing. °This information is not intended to replace advice given to you by your health care provider. Make sure you discuss any questions you have  with your health care provider. °Document Released: 08/05/2013 Document Revised: 03/19/2016 Document Reviewed: 02/04/2016 °Elsevier Interactive Patient Education © 2019 [employer] °Bone Marrow Aspiration and Bone Marrow Biopsy, Adult, Care After °This sheet gives you information about how to care for yourself after your procedure. Your health care provider may also give you more specific instructions. If you have problems or questions, contact your health care provider. °What can I expect after the procedure? °After the procedure, it is common to have: °· Mild pain and tenderness. °· Swelling. °· Bruising. °Follow these instructions at home: °Puncture site care ° °  ° °· Follow instructions from your health care provider about how to take care of the puncture site. Make sure you: °? Wash your hands with soap and water before you change your bandage (dressing). If soap and water are not available, use hand sanitizer. °? Change your dressing as told by your health care provider. °· Check your puncture site every day for signs of infection. Check for: °? More redness, swelling, or pain. °? More fluid or blood. °? Warmth. °? Pus or a bad smell. °General instructions °· Take over-the-counter and prescription medicines only as told by your health care provider. °· Do not take baths, swim, or use a hot tub until your health care provider approves. Ask if you can take a shower or have a sponge bath. °· Return to your normal activities as told by your health care provider. Ask your health care provider what activities are safe for you. °· Do not drive for 24 hours if you were given   a medicine to help you relax (sedative) during your procedure. °· Keep all follow-up visits as told by your health care provider. This is important. °Contact a health care provider if: °· Your pain is not controlled with medicine. °Get help right away if: °· You have a fever. °· You have more redness, swelling, or pain around the puncture  site. °· You have more fluid or blood coming from the puncture site. °· Your puncture site feels warm to the touch. °· You have pus or a bad smell coming from the puncture site. °These symptoms may represent a serious problem that is an emergency. Do not wait to see if the symptoms will go away. Get medical help right away. Call your local emergency services (911 in the [place] Do not drive yourself to the hospital. °Summary °· After the procedure, it is common to have mild pain, tenderness, swelling, and bruising. °· Follow instructions from your health care provider about how to take care of the puncture site. °· Get help right away if you have any symptoms of infection or if you have more blood or fluid coming from the puncture site. °This information is not intended to replace advice given to you by your health care provider. Make sure you discuss any questions you have with your health care provider. °Document Released: 05/04/2005 Document Revised: 01/28/2018 Document Reviewed: 03/28/2016 °Elsevier Interactive Patient Education © 2019 [employer] ° °

## 2019-04-03 NOTE — Procedures (Signed)
Interventional Radiology Procedure Note  Procedure: CT guided left retroperitoneal lymph node and bone marrow aspiration/biopsy  Complications: None  EBL: < 10 mL  Findings: 17 G needle advanced to level of left para-aortic lymphadenopathy. 18 G core biopsy performed x 6.  Aspirate and core biopsy performed of bone marrow in right iliac bone via 11G needle.  Plan: Bedrest supine x 1.5 hrs  Dominika Losey T. Kathlene Cote, M.D Pager:  [phone]

## 2019-04-04 LAB — NOVEL CORONAVIRUS, NAA (HOSP ORDER, SEND-OUT TO REF LAB; TAT 18-24 HRS): SARS-CoV-2, NAA: NOT DETECTED

## 2019-04-06 ENCOUNTER — Encounter: Payer: Self-pay | Admitting: Oncology

## 2019-04-06 ENCOUNTER — Ambulatory Visit: Payer: BLUE CROSS/BLUE SHIELD

## 2019-04-06 ENCOUNTER — Other Ambulatory Visit: Payer: Self-pay | Admitting: *Deleted

## 2019-04-06 ENCOUNTER — Other Ambulatory Visit: Payer: BLUE CROSS/BLUE SHIELD

## 2019-04-06 ENCOUNTER — Ambulatory Visit
Admission: RE | Admit: 2019-04-06 | Discharge: 2019-04-06 | Disposition: A | Discharge status: Home / Self Care | Payer: BLUE CROSS/BLUE SHIELD | Source: Ambulatory Visit | Attending: Oncology | Admitting: Oncology

## 2019-04-06 ENCOUNTER — Other Ambulatory Visit: Payer: Self-pay

## 2019-04-06 ENCOUNTER — Telehealth: Payer: Self-pay | Admitting: *Deleted

## 2019-04-06 DIAGNOSIS — Z79899 Other long term (current) drug therapy: Secondary | ICD-10-CM | POA: Insufficient documentation

## 2019-04-06 DIAGNOSIS — J45909 Unspecified asthma, uncomplicated: Secondary | ICD-10-CM | POA: Insufficient documentation

## 2019-04-06 DIAGNOSIS — C8203 Follicular lymphoma grade I, intra-abdominal lymph nodes: Secondary | ICD-10-CM | POA: Diagnosis present

## 2019-04-06 DIAGNOSIS — Z90710 Acquired absence of both cervix and uterus: Secondary | ICD-10-CM | POA: Insufficient documentation

## 2019-04-06 DIAGNOSIS — Z960 Presence of urogenital implants: Secondary | ICD-10-CM | POA: Diagnosis not present

## 2019-04-06 HISTORY — PX: IR IMAGING GUIDED PORT INSERTION: IMG5740

## 2019-04-06 HISTORY — DX: Non-Hodgkin lymphoma, unspecified, unspecified site: C85.90

## 2019-04-06 MED ORDER — LIDOCAINE-EPINEPHRINE (PF) 1 %-1:200000 IJ SOLN
INTRAMUSCULAR | Status: AC
  Filled 2019-04-06: qty 10

## 2019-04-06 MED ORDER — FENTANYL CITRATE (PF) 100 MCG/2ML IJ SOLN
INTRAMUSCULAR | Status: AC | PRN
  Administered 2019-04-06 (×2): 50 ug via INTRAVENOUS

## 2019-04-06 MED ORDER — MIDAZOLAM HCL 2 MG/2ML IJ SOLN
INTRAMUSCULAR | Status: AC
  Filled 2019-04-06: qty 2

## 2019-04-06 MED ORDER — HEPARIN SOD (PORK) LOCK FLUSH 100 UNIT/ML IV SOLN
INTRAVENOUS | Status: AC
  Filled 2019-04-06: qty 5

## 2019-04-06 MED ORDER — FENTANYL CITRATE (PF) 100 MCG/2ML IJ SOLN
INTRAMUSCULAR | Status: AC
  Filled 2019-04-06: qty 2

## 2019-04-06 MED ORDER — CEFAZOLIN SODIUM-DEXTROSE 2-4 GM/100ML-% IV SOLN
2.0000 g | Freq: Once | INTRAVENOUS | Status: AC
  Administered 2019-04-06: 2 g via INTRAVENOUS

## 2019-04-06 MED ORDER — MIDAZOLAM HCL 2 MG/2ML IJ SOLN
INTRAMUSCULAR | Status: AC | PRN
  Administered 2019-04-06: 1 mg via INTRAVENOUS

## 2019-04-06 MED ORDER — SODIUM CHLORIDE 0.9 % IV SOLN
INTRAVENOUS | Status: DC
  Administered 2019-04-06: 13:00:00 via INTRAVENOUS

## 2019-04-06 MED ORDER — LORAZEPAM 2 MG/ML IJ SOLN
INTRAMUSCULAR | Status: AC | PRN
  Administered 2019-04-06: 1 mg via INTRAVENOUS

## 2019-04-06 MED ORDER — CEFAZOLIN SODIUM-DEXTROSE 2-4 GM/100ML-% IV SOLN
INTRAVENOUS | Status: AC
  Filled 2019-04-06: qty 100

## 2019-04-06 NOTE — Telephone Encounter (Signed)
Called patient and let her know we are checking every day to get bx results for Dr. Janese Banks to make decision of what chemo to give her. Also because the waiting on the pathology and the need for time to get chemo approved could be a potential delay to get chemo started like we want to do this week. We are having our authorization staff to try to check about if rituxan at a minimal because we know it is lymphoma but not sure type yet.I will update patient when I have any news as well as if we need to cancel appt this Thursday. Also dr Janese Banks is thinking about she may have to give patient a chemo that we will need to check her heart function. Dr Janese Banks wanted me to get it one day this week before thur. I was able to get her appt 6/[facility] for 10 am appt. She knows where to go.

## 2019-04-06 NOTE — Discharge Instructions (Signed)
Implanted Port Insertion, Care After °This sheet gives you information about how to care for yourself after your procedure. Your health care provider may also give you more specific instructions. If you have problems or questions, contact your health care provider. °What can I expect after the procedure? °After the procedure, it is common to have: °· Discomfort at the port insertion site. °· Bruising on the skin over the port. This should improve over 3-4 days. °Follow these instructions at home: °Port care °· After your port is placed, you will get a manufacturer's information card. The card has information about your port. Keep this card with you at all times. °· Take care of the port as told by your health care provider. Ask your health care provider if you or a family member can get training for taking care of the port at home. A home health care nurse may also take care of the port. °· Make sure to remember what type of port you have. °Incision care ° °  ° °· Follow instructions from your health care provider about how to take care of your port insertion site. Make sure you: °? Wash your hands with soap and water before and after you change your bandage (dressing). If soap and water are not available, use hand sanitizer. °? Change your dressing as told by your health care provider. °? Leave stitches (sutures), skin glue, or adhesive strips in place. These skin closures may need to stay in place for 2 weeks or longer. If adhesive strip edges start to loosen and curl up, you may trim the loose edges. Do not remove adhesive strips completely unless your health care provider tells you to do that. °· Check your port insertion site every day for signs of infection. Check for: °? Redness, swelling, or pain. °? Fluid or blood. °? Warmth. °? Pus or a bad smell. °Activity °· Return to your normal activities as told by your health care provider. Ask your health care provider what activities are safe for you. °· Do not  lift anything that is heavier than 10 lb (4.5 kg), or the limit that you are told, until your health care provider says that it is safe. °General instructions °· Take over-the-counter and prescription medicines only as told by your health care provider. °· Do not take baths, swim, or use a hot tub until your health care provider approves. Ask your health care provider if you may take showers. You may only be allowed to take sponge baths. °· Do not drive for 24 hours if you were given a sedative during your procedure. °· Wear a medical alert bracelet in case of an emergency. This will tell any health care providers that you have a port. °· Keep all follow-up visits as told by your health care provider. This is important. °Contact a health care provider if: °· You cannot flush your port with saline as directed, or you cannot draw blood from the port. °· You have a fever or chills. °· You have redness, swelling, or pain around your port insertion site. °· You have fluid or blood coming from your port insertion site. °· Your port insertion site feels warm to the touch. °· You have pus or a bad smell coming from the port insertion site. °Get help right away if: °· You have chest pain or shortness of breath. °· You have bleeding from your port that you cannot control. °Summary °· Take care of the port as told by your health   care provider. Keep the manufacturer's information card with you at all times. °· Change your dressing as told by your health care provider. °· Contact a health care provider if you have a fever or chills or if you have redness, swelling, or pain around your port insertion site. °· Keep all follow-up visits as told by your health care provider. °This information is not intended to replace advice given to you by your health care provider. Make sure you discuss any questions you have with your health care provider. °Document Released: 08/05/2013 Document Revised: 05/13/2018 Document Reviewed:  05/13/2018 °Elsevier Interactive Patient Education © 2019 [employer] ° °Moderate Conscious Sedation, Adult, Care After °These instructions provide you with information about caring for yourself after your procedure. Your health care provider may also give you more specific instructions. Your treatment has been planned according to current medical practices, but problems sometimes occur. Call your health care provider if you have any problems or questions after your procedure. °What can I expect after the procedure? °After your procedure, it is common: °· To feel sleepy for several hours. °· To feel clumsy and have poor balance for several hours. °· To have poor judgment for several hours. °· To vomit if you eat too soon. °Follow these instructions at home: °For at least 24 hours after the procedure: ° °· Do not: °? Participate in activities where you could fall or become injured. °? Drive. °? Use heavy machinery. °? Drink alcohol. °? Take sleeping pills or medicines that cause drowsiness. °? Make important decisions or sign legal documents. °? Take care of children on your own. °· Rest. °Eating and drinking °· Follow the diet recommended by your health care provider. °· If you vomit: °? Drink water, juice, or soup when you can drink without vomiting. °? Make sure you have little or no nausea before eating solid foods. °General instructions °· Have a responsible adult stay with you until you are awake and alert. °· Take over-the-counter and prescription medicines only as told by your health care provider. °· If you smoke, do not smoke without supervision. °· Keep all follow-up visits as told by your health care provider. This is important. °Contact a health care provider if: °· You keep feeling nauseous or you keep vomiting. °· You feel light-headed. °· You develop a rash. °· You have a fever. °Get help right away if: °· You have trouble breathing. °This information is not intended to replace advice given to you  by your health care provider. Make sure you discuss any questions you have with your health care provider. °Document Released: 08/05/2013 Document Revised: 03/19/2016 Document Reviewed: 02/04/2016 °Elsevier Interactive Patient Education © 2019 [employer] ° °

## 2019-04-06 NOTE — H&P (Signed)
Chief Complaint: Lymphoma  Referring Physician(s): Sindy Guadeloupe  Supervising Physician: Corrie Mckusick  Patient Status: [facility] - Out-pt  History of Present Illness: Andrea Lloyd is a 63 y.o. female presenting for port catheter placement.   She has diagnosis of lymphoma, and will be initiating chemotherapy on Thursday.   She denies any recent fevers,rigors,chills.    Past Medical History:  Diagnosis Date   Asthma    Family history of adverse reaction to anesthesia    Father - slow to wake   GERD (gastroesophageal reflux disease)    Gravida 4 para 4    Lymphoma ([place])     Past Surgical History:  Procedure Laterality Date   ABDOMINAL HYSTERECTOMY     06 01 2013   BLADDER SURGERY     COLONOSCOPY WITH PROPOFOL N/A 02/25/2017   Procedure: COLONOSCOPY WITH PROPOFOL;  Surgeon: Lucilla Lame, MD;  Location: [facility];  Service: Endoscopy;  Laterality: N/A;   CYSTOSCOPY W/ URETERAL STENT PLACEMENT Left 03/28/2019   Procedure: CYSTOSCOPY WITH RETROGRADE PYELOGRAM/URETERAL STENT PLACEMENT;  Surgeon: Abbie Sons, MD;  Location: [facility];  Service: Urology;  Laterality: Left;   POLYPECTOMY  02/25/2017   Procedure: POLYPECTOMY;  Surgeon: Lucilla Lame, MD;  Location: [place];  Service: Endoscopy;;   ROTATOR CUFF REPAIR Right    TONSILLECTOMY  1972    Allergies: Adhesive [tape]  Medications: Prior to Admission medications   Medication Sig Start Date End Date Taking? Authorizing Provider  albuterol (PROVENTIL HFA) 108 (90 Base) MCG/ACT inhaler Inhale 2 puffs into the lungs every 6 (six) hours as needed for wheezing or shortness of breath.  11/19/17  Yes [provider]  allopurinol (ZYLOPRIM) 300 MG tablet Take 1 tablet (300 mg total) by mouth 2 (two) times daily. 04/03/19  Yes Sindy Guadeloupe, MD  Cholecalciferol (VITAMIN D3 PO) Take 20,000 mcg by mouth daily.   Yes [provider]  Menaquinone-7 (VITAMIN K2 PO) Take 200 mcg  by mouth daily.   Yes [provider]  Multiple Vitamin (MULTI-VITAMIN DAILY PO) Take 1 tablet by mouth daily.    Yes [provider]  oxybutynin (DITROPAN) 5 MG tablet Take 1 tablet (5 mg total) by mouth every 8 (eight) hours as needed for bladder spasms. 03/29/19  Yes Henreitta Leber, MD  Probiotic Product (PROBIOTIC PO) Take by mouth daily.   Yes [provider]     Family History  Problem Relation Age of Onset   Alcohol abuse Maternal Grandfather    Healthy Mother    Cancer Father    Healthy Sister    Healthy Brother    Breast cancer Maternal Aunt    Hyperlipidemia Neg Hx    Heart disease Neg Hx    Diabetes Neg Hx     Social History   Socioeconomic History   Marital status: Married    Spouse name: Not on file   Number of children: Not on file   Years of education: Not on file   Highest education level: Not on file  Occupational History   Not on file  Social Needs   Financial resource strain: Not on file   Food insecurity:    Worry: Not on file    Inability: Not on file   Transportation needs:    Medical: Not on file    Non-medical: Not on file  Tobacco Use   Smoking status: Never Smoker   Smokeless tobacco: Never Used  Substance and Sexual Activity  Alcohol use: No   Drug use: No   Sexual activity: Yes  Lifestyle   Physical activity:    Days per week: Not on file    Minutes per session: Not on file   Stress: Not on file  Relationships   Social connections:    Talks on phone: Not on file    Gets together: Not on file    Attends religious service: Not on file    Active member of club or organization: Not on file    Attends meetings of clubs or organizations: Not on file    Relationship status: Not on file  Other Topics Concern   Not on file  Social History Narrative   Not on file      Review of Systems: A 12 point ROS discussed and pertinent positives are indicated in the HPI above.  All other  systems are negative.  Review of Systems  Vital Signs: BP 102/77    Pulse 80    Temp 98.3 F (36.8 C) (Oral)    Resp 16    Ht '5\' 6"'  (1.676 m)    Wt 81.2 kg    SpO2 95%    BMI 28.89 kg/m   Physical Exam General: 63 yo female appearing  stated age.  Well-developed, well-nourished.  No distress. HEENT: Atraumatic, normocephalic.  Conjugate gaze, extra-ocular motor intact. No scleral icterus or scleral injection. No lesions on external ears, nose, lips, or gums.  Oral mucosa moist, pink.  Neck: Symmetric with no goiter enlargement.  Chest/Lungs:  Symmetric chest with inspiration/expiration.  No labored breathing.  Clear to auscultation with no wheezes, rhonchi, or rales.  Heart:  RRR, with no third heart sounds appreciated. No JVD appreciated.  Abdomen:  Soft, NT/ND, with + bowel sounds.   Genito-urinary: Deferred Neurologic: Alert & Oriented to person, place, and time.   Normal affect and insight.  Appropriate questions.  Moving all 4 extremities with gross sensory intact.    Imaging: Dg Abd 1 View  Result Date: 03/28/2019 CLINICAL DATA:  Left ureteral stent placement. FLUOROSCOPY TIME:  42 seconds. Images: 2 EXAM: ABDOMEN - 1 VIEW COMPARISON:  CT scan Mar 28, 2019 FINDINGS: By the end of the study, a stent has been placed in the left ureter. Only the proximal aspect of the stent is identified. The proximal portion of the stent has not formed a complete pigtail. Hydronephrosis remains on the final image of the study. IMPRESSION: Left ureteral stent placement as above. Electronically Signed   By: Dorise Bullion III M.D   On: 03/28/2019 17:42   Ct Abdomen Pelvis W Contrast  Result Date: 03/28/2019 CLINICAL DATA:  Left lower quadrant abdominal pain and peritoneal signs on exam. Prior hysterectomy. History of follicular lymphoma. EXAM: CT ABDOMEN AND PELVIS WITH CONTRAST TECHNIQUE: Multidetector CT imaging of the abdomen and pelvis was performed using the standard protocol following bolus  administration of intravenous contrast. CONTRAST:  167m OMNIPAQUE IOHEXOL 300 MG/ML  SOLN COMPARISON:  12/31/2016 CT abdomen/pelvis and 01/08/2017 PET-CT. FINDINGS: Lower chest: Trace dependent left pleural effusion. New bilateral retrocrural adenopathy measuring up to 2.2 cm (series 2/image 23). Hepatobiliary: Normal liver size. No liver mass. Distended gallbladder. No gallbladder wall thickening. No radiopaque cholelithiasis. Minimal central intrahepatic biliary ductal dilatation, unchanged. CBD diameter 7 mm, slightly dilated, unchanged. No radiopaque choledocholithiasis. Pancreas: Normal, with no mass or duct dilation. Spleen: Normal size. No mass. Adrenals/Urinary Tract: Normal adrenals. New moderate left hydronephrosis with mild left perinephric edema. No right hydronephrosis. No  renal masses. Normal bladder. Stomach/Bowel: Normal non-distended stomach. Normal caliber small bowel with no small bowel wall thickening. Normal appendix. Scattered mild colonic diverticulosis, with no large bowel wall thickening or significant pericolonic fat stranding. Vascular/Lymphatic: Atherosclerotic nonaneurysmal abdominal aorta. Patent portal, splenic, hepatic and renal veins. Massive confluent retroperitoneal adenopathy throughout pericaval, aortocaval and left periaortic chains, significantly increased from prior. Representative 7.3 cm short axis left para-aortic node (series 2/image 36), increased from 1.6 cm. Representative short axis 5.5 cm aortocaval node (series 2/image 32), increased from 2.1 cm. Reproductive: Status post hysterectomy, with no abnormal findings at the vaginal cuff. No adnexal mass. Other: No ascites. No pneumoperitoneum. Large subcutaneous 8.1 x 4.3 cm collection in the left gluteal region with thick wall (series 2/image 66), new from prior. Musculoskeletal: No aggressive appearing focal osseous lesions. Mild thoracolumbar spondylosis. IMPRESSION: 1. Marked confluent retroperitoneal and retrocaval  adenopathy, substantially progressed since 12/31/2016 CT study, compatible with progression of the patient's biopsy proven follicular lymphoma. 2. New moderate left hydronephrosis due to extrinsic obstruction from the progressive adenopathy. 3. Trace dependent left pleural effusion. 4. Mild colonic diverticulosis, with no evidence of acute diverticulitis. No evidence of bowel obstruction. 5. Large subcutaneous collection in the left gluteal region with thick wall, nonspecific, potentially posttraumatic hematoma. Infected collection cannot be excluded. 6.  Aortic Atherosclerosis (ICD10-I70.0). Electronically Signed   By: Ilona Sorrel M.D.   On: 03/28/2019 10:40   Nm Pet Image Restag (ps) Skull Base To Thigh  Result Date: 04/02/2019 CLINICAL DATA:  Subsequent treatment strategy for lymphoma. EXAM: NUCLEAR MEDICINE PET SKULL BASE TO THIGH TECHNIQUE: 10.1 mCi F-18 FDG was injected intravenously. Full-ring PET imaging was performed from the skull base to thigh after the radiotracer. CT data was obtained and used for attenuation correction and anatomic localization. Fasting blood glucose: 95 mg/dl COMPARISON:  PET-CT 01/08/2017. and CT AP from 03/28/2019 FINDINGS: Mediastinal blood pool activity: SUV max 2.2 Liver activity: SUV max 3.7 NECK: Enlarged left supraclavicular lymph node measures 1.9 cm and has an SUV max of 17.05, Deauville criteria 5. Previously this measured 0.7 cm with an SUV max of 4.1. Incidental CT findings: none CHEST: No hypermetabolic mediastinal or hilar nodes. No suspicious pulmonary nodules on the CT scan. Incidental CT findings: Small left pleural effusion.  New. ABDOMEN/PELVIS: No abnormal uptake within the liver, pancreas, or spleen. The spleen measures 10.5 cm in length with SUV max similar to liver activity. Extensive, bulky retroperitoneal lymph nodes identified. Dominant retroperitoneal mass measures 12.3 by 8.7 by 12.3 cm and has an SUV max of 23.9, Deauville criteria 5. On previous exam  the largest retroperitoneal lymph node had a short axis of 1.9 cm and an SUV max of 6.4. FDG avid enlarged retrocrural lymph nodes noted measuring 4.0 x 2.5 cm within SUV max of 18.5, Deauville criteria 5. previously this measured 0.9 cm within SUV max of 3.19. Incidental CT findings: Left-sided hydronephrosis is identified with newly placed ureteral stent. SKELETON: No focal hypermetabolic activity to suggest skeletal metastasis. Incidental CT findings: There is a mass within the left gluteal region which measures 7.5 by 6.1 cm. This is peripherally FDG avid suggesting underlying inflammatory or infectious process. IMPRESSION: 1. Examination compatible with recurrent progressive lymphoma, Deauville criteria 5. 2. Enlarged left supraclavicular, retrocrural and retroperitoneal adenopathy identified. Sites of disease demonstrate significant increase in size and degree of FDG uptake compared with previous. 3. Left-sided hydronephrosis status post ureteral stent placement 4. Persistent left gluteal region subcutaneous fluid collection with peripheral FDG uptake compatible  with an inflammatory or infectious process. 5. Small left pleural effusion.  New. Electronically Signed   By: Kerby Moors M.D.   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 The patient now presents with significant progressive lymph node enlargement and PET scan evidence of progressive disease. Repeat lymph node biopsy now necessary to restage lymphoma. EXAM: CT GUIDED CORE BIOPSY OF LEFT PARA-AORTIC RETROPERITONEAL LYMPHADENOPATHY ANESTHESIA/SEDATION: 4.0 mg IV Versed; 100 mcg IV Fentanyl Total Moderate Sedation Time:  45 minutes. The patient's level of consciousness and physiologic status were continuously monitored during the procedure by Radiology nursing. Sedation represents total sedation  administered for combined CT-guided bone marrow and lymph node biopsy procedures. PROCEDURE: The procedure risks, benefits, and alternatives were explained to the patient. Questions regarding the procedure were encouraged and answered. The patient understands and consents to the procedure. A time-out was performed prior to initiating the procedure. CT was performed through the lower abdomen in a prone position. The left translumbar region was prepped with chlorhexidine in a sterile fashion, and a sterile drape was applied covering the operative field. A sterile gown and sterile gloves were used for the procedure. Local anesthesia was provided with 1% Lidocaine. Under CT guidance, a 17 gauge needle was advanced from a left trans lumbar approach to the level of enlarged para-aortic lymphadenopathy. Coaxial 18 gauge core biopsy samples were obtained. A total of 6 core biopsy samples were submitted on saline soaked Telfa gauze. COMPLICATIONS: None FINDINGS: Largest area confluent lymphadenopathy measuring approximately 10 cm in maximum diameter was targeted in the left para-aortic region medial to a displaced ureteral stent. Solid tissue was obtained. IMPRESSION: CT-guided core biopsy performed of large lymph node mass in the left para-aortic retroperitoneum. Electronically Signed   By: Aletta Edouard M.D.   On: 04/03/2019 13:29   Ct Bone Marrow Biopsy & Aspiration  Result Date: 04/03/2019 CLINICAL DATA:  Follicular lymphoma with CT and PET scan evidence progressive lymph node disease and need for bone marrow biopsy for staging. EXAM: CT GUIDED BONE MARROW ASPIRATION AND BIOPSY ANESTHESIA/SEDATION: Versed 4.0 mg IV, Fentanyl 100 mcg IV Total Moderate Sedation Time:   45 minutes. The patient's level of consciousness and physiologic status were continuously monitored during the procedure by Radiology nursing. Sedation and sedation time reflect sedation given for combined bone marrow and lymph node biopsy procedures.  PROCEDURE: The procedure risks, benefits, and alternatives were explained to the patient. Questions regarding the procedure were encouraged and answered. The patient understands and consents to the procedure. A time out was performed prior to initiating the procedure. The right gluteal region was prepped with chlorhexidine. Sterile gown and sterile gloves were used for the procedure. Local anesthesia was provided with 1% Lidocaine. Under CT guidance, an 11 gauge On Control bone cutting needle was advanced from a posterior approach into the right iliac bone. Needle positioning was confirmed with CT. Initial non heparinized and heparinized aspirate samples were obtained of bone marrow. Core biopsy was performed via the On Control drill needle. COMPLICATIONS: None FINDINGS: Inspection of initial aspirate did reveal visible particles. Intact core biopsy sample was obtained. IMPRESSION: CT guided bone marrow biopsy of right posterior iliac bone with both aspirate and core samples obtained. Electronically Signed   By: Aletta Edouard M.D.   On: 04/03/2019 13:26   Dg C-arm 1-60 Min  Result Date: 03/28/2019  CLINICAL DATA:  Left ureteral stent placement. FLUOROSCOPY TIME:  42 seconds. Images: 2 EXAM: ABDOMEN - 1 VIEW COMPARISON:  CT scan Mar 28, 2019 FINDINGS: By the end of the study, a stent has been placed in the left ureter. Only the proximal aspect of the stent is identified. The proximal portion of the stent has not formed a complete pigtail. Hydronephrosis remains on the final image of the study. IMPRESSION: Left ureteral stent placement as above. Electronically Signed   By: Dorise Bullion III M.D   On: 03/28/2019 17:42    Labs:  CBC: Recent Labs    04/25/18 4008 03/28/19 0905 03/29/19 0417 04/03/19 0912  WBC 8.0 7.6 6.3 7.6  HGB 14.1 14.6 12.6 13.8  HCT 41.0 45.5 38.9 41.7  PLT 262 274 266 268    COAGS: Recent Labs    04/03/19 0912  INR 0.9    BMP: Recent Labs    04/25/18 0854  03/28/19 0905 03/29/19 0417  NA 139 141 141  K 4.5 4.0 4.2  CL 106 104 107  CO2 '25 26 23  ' GLUCOSE 100* 96 126*  BUN '18 8 11  ' CALCIUM 9.7 9.5 8.6*  CREATININE 0.78 0.79 0.74  GFRNONAA >60 >60 >60  GFRAA >60 >60 >60    LIVER FUNCTION TESTS: Recent Labs    04/25/18 0854 03/28/19 0905  BILITOT 1.3* 1.5*  AST 21 27  ALT 22 29  ALKPHOS 71 73  PROT 7.5 7.9  ALBUMIN 4.4 4.7    TUMOR MARKERS: No results for input(s): AFPTM, CEA, CA199, CHROMGRNA in the last 8760 hours.  Assessment and Plan:  Ms Ryser presents today for a port catheter.   Risks and benefits of image guided port-a-catheter placement was discussed with the patient including, but not limited to bleeding, infection, pneumothorax, or fibrin sheath development and need for additional procedures.  All of the patient's questions were answered, patient is agreeable to proceed. Consent signed and in chart.  Thank you for this interesting consult.  I greatly enjoyed meeting Andrea Lloyd and look forward to participating in their care.  A copy of this report was sent to the requesting provider on this date.  Electronically Signed: Corrie Mckusick, DO 04/06/2019, 1:54 PM   I spent a total of  40 Minutes   in face to face in clinical consultation, greater than 50% of which was counseling/coordinating care for port catheter placement.

## 2019-04-06 NOTE — Patient Instructions (Signed)
Rituximab injection What is this medicine? RITUXIMAB (ri TUX i mab) is a monoclonal antibody. It is used to treat certain types of cancer like non-Hodgkin lymphoma and chronic lymphocytic leukemia. It is also used to treat rheumatoid arthritis, granulomatosis with polyangiitis (or Wegener's granulomatosis), microscopic polyangiitis, and pemphigus vulgaris. This medicine may be used for other purposes; ask your health care provider or pharmacist if you have questions. COMMON BRAND NAME(S): Rituxan What should I tell my health care provider before I take this medicine? They need to know if you have any of these conditions: -heart disease -infection (especially a virus infection such as hepatitis B, chickenpox, cold sores, or herpes) -immune system problems -irregular heartbeat -kidney disease -low blood counts, like low white cell, platelet, or red cell counts -lung or breathing disease, like asthma -recently received or scheduled to receive a vaccine -an unusual or allergic reaction to rituximab, other medicines, foods, dyes, or preservatives -pregnant or trying to get pregnant -breast-feeding How should I use this medicine? This medicine is for infusion into a vein. It is administered in a hospital or clinic by a specially trained health care professional. A special MedGuide will be given to you by the pharmacist with each prescription and refill. Be sure to read this information carefully each time. Talk to your pediatrician regarding the use of this medicine in children. This medicine is not approved for use in children. Overdosage: If you think you have taken too much of this medicine contact a poison control center or emergency room at once. NOTE: This medicine is only for you. Do not share this medicine with others. What if I miss a dose? It is important not to miss a dose. Call your doctor or health care professional if you are unable to keep an appointment. What may interact with  this medicine? -cisplatin -live virus vaccines This list may not describe all possible interactions. Give your health care provider a list of all the medicines, herbs, non-prescription drugs, or dietary supplements you use. Also tell them if you smoke, drink alcohol, or use illegal drugs. Some items may interact with your medicine. What should I watch for while using this medicine? Your condition will be monitored carefully while you are receiving this medicine. You may need blood work done while you are taking this medicine. This medicine can cause serious allergic reactions. To reduce your risk you may need to take medicine before treatment with this medicine. Take your medicine as directed. In some patients, this medicine may cause a serious brain infection that may cause death. If you have any problems seeing, thinking, speaking, walking, or standing, tell your healthcare professional right away. If you cannot reach your healthcare professional, urgently seek other source of medical care. Call your doctor or health care professional for advice if you get a fever, chills or sore throat, or other symptoms of a cold or flu. Do not treat yourself. This drug decreases your body's ability to fight infections. Try to avoid being around people who are sick. Do not become pregnant while taking this medicine or for 12 months after stopping it. Women should inform their doctor if they wish to become pregnant or think they might be pregnant. There is a potential for serious side effects to an unborn child. Talk to your health care professional or pharmacist for more information. Do not breast-feed an infant while taking this medicine or for 6 months after stopping it. What side effects may I notice from receiving this medicine? Side effects   that you should report to your doctor or health care professional as soon as possible: -allergic reactions like skin rash, itching or hives; swelling of the face, lips, or  tongue -breathing problems -chest pain -changes in vision -diarrhea -headache with fever, neck stiffness, sensitivity to light, nausea, or confusion -fast, irregular heartbeat -loss of memory -low blood counts - this medicine may decrease the number of white blood cells, red blood cells and platelets. You may be at increased risk for infections and bleeding. -mouth sores -problems with balance, talking, or walking -redness, blistering, peeling or loosening of the skin, including inside the mouth -signs of infection - fever or chills, cough, sore throat, pain or difficulty passing urine -signs and symptoms of kidney injury like trouble passing urine or change in the amount of urine -signs and symptoms of liver injury like dark yellow or brown urine; general ill feeling or flu-like symptoms; light-colored stools; loss of appetite; nausea; right upper belly pain; unusually weak or tired; yellowing of the eyes or skin -signs and symptoms of low blood pressure like dizziness; feeling faint or lightheaded, falls; unusually weak or tired -stomach pain -swelling of the ankles, feet, hands -unusual bleeding or bruising -vomiting Side effects that usually do not require medical attention (report to your doctor or health care professional if they continue or are bothersome): -headache -joint pain -muscle cramps or muscle pain -nausea -tiredness This list may not describe all possible side effects. Call your doctor for medical advice about side effects. You may report side effects to FDA at 1-800-FDA-1088. Where should I keep my medicine? This drug is given in a hospital or clinic and will not be stored at home. NOTE: This sheet is a summary. It may not cover all possible information. If you have questions about this medicine, talk to your doctor, pharmacist, or health care provider.  2019 Elsevier/Gold Standard (2017-09-27 13:04:32) Doxorubicin injection What is this medicine? DOXORUBICIN (dox  oh ROO bi sin) is a chemotherapy drug. It is used to treat many kinds of cancer like leukemia, lymphoma, neuroblastoma, sarcoma, and Wilms' tumor. It is also used to treat bladder cancer, breast cancer, lung cancer, ovarian cancer, stomach cancer, and thyroid cancer. This medicine may be used for other purposes; ask your health care provider or pharmacist if you have questions. COMMON BRAND NAME(S): Adriamycin, Adriamycin PFS, Adriamycin RDF, Rubex What should I tell my health care provider before I take this medicine? They need to know if you have any of these conditions: -heart disease -history of low blood counts caused by a medicine -liver disease -recent or ongoing radiation therapy -an unusual or allergic reaction to doxorubicin, other chemotherapy agents, other medicines, foods, dyes, or preservatives -pregnant or trying to get pregnant -breast-feeding How should I use this medicine? This drug is given as an infusion into a vein. It is administered in a hospital or clinic by a specially trained health care professional. If you have pain, swelling, burning or any unusual feeling around the site of your injection, tell your health care professional right away. Talk to your pediatrician regarding the use of this medicine in children. Special care may be needed. Overdosage: If you think you have taken too much of this medicine contact a poison control center or emergency room at once. NOTE: This medicine is only for you. Do not share this medicine with others. What if I miss a dose? It is important not to miss your dose. Call your doctor or health care professional if you are   unable to keep an appointment. What may interact with this medicine? This medicine may interact with the following medications: -6-mercaptopurine -paclitaxel -phenytoin -St. John's Wort -trastuzumab -verapamil This list may not describe all possible interactions. Give your health care provider a list of all the  medicines, herbs, non-prescription drugs, or dietary supplements you use. Also tell them if you smoke, drink alcohol, or use illegal drugs. Some items may interact with your medicine. What should I watch for while using this medicine? This drug may make you feel generally unwell. This is not uncommon, as chemotherapy can affect healthy cells as well as cancer cells. Report any side effects. Continue your course of treatment even though you feel ill unless your doctor tells you to stop. There is a maximum amount of this medicine you should receive throughout your life. The amount depends on the medical condition being treated and your overall health. Your doctor will watch how much of this medicine you receive in your lifetime. Tell your doctor if you have taken this medicine before. You may need blood work done while you are taking this medicine. Your urine may turn red for a few days after your dose. This is not blood. If your urine is dark or brown, call your doctor. In some cases, you may be given additional medicines to help with side effects. Follow all directions for their use. Call your doctor or health care professional for advice if you get a fever, chills or sore throat, or other symptoms of a cold or flu. Do not treat yourself. This drug decreases your body's ability to fight infections. Try to avoid being around people who are sick. This medicine may increase your risk to bruise or bleed. Call your doctor or health care professional if you notice any unusual bleeding. Talk to your doctor about your risk of cancer. You may be more at risk for certain types of cancers if you take this medicine. Do not become pregnant while taking this medicine or for 6 months after stopping it. Women should inform their doctor if they wish to become pregnant or think they might be pregnant. Men should not father a child while taking this medicine and for 6 months after stopping it. There is a potential for  serious side effects to an unborn child. Talk to your health care professional or pharmacist for more information. Do not breast-feed an infant while taking this medicine. This medicine has caused ovarian failure in some women and reduced sperm counts in some men This medicine may interfere with the ability to have a child. Talk with your doctor or health care professional if you are concerned about your fertility. This medicine may cause a decrease in Co-Enzyme Q-10. You should make sure that you get enough Co-Enzyme Q-10 while you are taking this medicine. Discuss the foods you eat and the vitamins you take with your health care professional. What side effects may I notice from receiving this medicine? Side effects that you should report to your doctor or health care professional as soon as possible: -allergic reactions like skin rash, itching or hives, swelling of the face, lips, or tongue -breathing problems -chest pain -fast or irregular heartbeat -low blood counts - this medicine may decrease the number of white blood cells, red blood cells and platelets. You may be at increased risk for infections and bleeding. -pain, redness, or irritation at site where injected -signs of infection - fever or chills, cough, sore throat, pain or difficulty passing urine -signs of   decreased platelets or bleeding - bruising, pinpoint red spots on the skin, black, tarry stools, blood in the urine -swelling of the ankles, feet, hands -tiredness -weakness Side effects that usually do not require medical attention (report to your doctor or health care professional if they continue or are bothersome): -diarrhea -hair loss -mouth sores -nail discoloration or damage -nausea -red colored urine -vomiting This list may not describe all possible side effects. Call your doctor for medical advice about side effects. You may report side effects to FDA at 1-800-FDA-1088. Where should I keep my medicine? This drug is  given in a hospital or clinic and will not be stored at home. NOTE: This sheet is a summary. It may not cover all possible information. If you have questions about this medicine, talk to your doctor, pharmacist, or health care provider.  2019 Elsevier/Gold Standard (2017-05-29 11:01:26) Vincristine injection What is this medicine? VINCRISTINE (vin KRIS teen) is a chemotherapy drug. It slows the growth of cancer cells. This medicine is used to treat many types of cancer like Hodgkin's disease, leukemia, non-Hodgkin's lymphoma, neuroblastoma (brain cancer), rhabdomyosarcoma, and Wilms' tumor. This medicine may be used for other purposes; ask your health care provider or pharmacist if you have questions. COMMON BRAND NAME(S): Oncovin, Vincasar PFS What should I tell my health care provider before I take this medicine? They need to know if you have any of these conditions: -blood disorders -gout -infection (especially chickenpox, cold sores, or herpes) -kidney disease -liver disease -lung disease -nervous system disease like Charcot-Marie-Tooth (CMT) -recent or ongoing radiation therapy -an unusual or allergic reaction to vincristine, other chemotherapy agents, other medicines, foods, dyes, or preservatives -pregnant or trying to get pregnant -breast-feeding How should I use this medicine? This drug is given as an infusion into a vein. It is administered in a hospital or clinic by a specially trained health care professional. If you have pain, swelling, burning, or any unusual feeling around the site of your injection, tell your health care professional right away. Talk to your pediatrician regarding the use of this medicine in children. While this drug may be prescribed for selected conditions, precautions do apply. Overdosage: If you think you have taken too much of this medicine contact a poison control center or emergency room at once. NOTE: This medicine is only for you. Do not share this  medicine with others. What if I miss a dose? It is important not to miss your dose. Call your doctor or health care professional if you are unable to keep an appointment. What may interact with this medicine? Do not take this medicine with any of the following medications: -itraconazole -mibefradil -voriconazole This medicine may also interact with the following medications: -cyclosporine -erythromycin -fluconazole -ketoconazole -medicines for HIV like delavirdine, efavirenz, nevirapine -medicines for seizures like ethotoin, fosphenotoin, phenytoin -medicines to increase blood counts like filgrastim, pegfilgrastim, sargramostim -other chemotherapy drugs like cisplatin, L-asparaginase, methotrexate, mitomycin, paclitaxel -pegaspargase -vaccines -zalcitabine, ddC Talk to your doctor or health care professional before taking any of these medicines: -acetaminophen -aspirin -ibuprofen -ketoprofen -naproxen This list may not describe all possible interactions. Give your health care provider a list of all the medicines, herbs, non-prescription drugs, or dietary supplements you use. Also tell them if you smoke, drink alcohol, or use illegal drugs. Some items may interact with your medicine. What should I watch for while using this medicine? Your condition will be monitored carefully while you are receiving this medicine. You will need important blood work done while you   are taking this medicine. This drug may make you feel generally unwell. This is not uncommon, as chemotherapy can affect healthy cells as well as cancer cells. Report any side effects. Continue your course of treatment even though you feel ill unless your doctor tells you to stop. In some cases, you may be given additional medicines to help with side effects. Follow all directions for their use. Call your doctor or health care professional for advice if you get a fever, chills or sore throat, or other symptoms of a cold or flu.  Do not treat yourself. Avoid taking products that contain aspirin, acetaminophen, ibuprofen, naproxen, or ketoprofen unless instructed by your doctor. These medicines may hide a fever. Do not become pregnant while taking this medicine. Women should inform their doctor if they wish to become pregnant or think they might be pregnant. There is a potential for serious side effects to an unborn child. Talk to your health care professional or pharmacist for more information. Do not breast-feed an infant while taking this medicine. Men may have a lower sperm count while taking this medicine. Talk to your doctor if you plan to father a child. What side effects may I notice from receiving this medicine? Side effects that you should report to your doctor or health care professional as soon as possible: -allergic reactions like skin rash, itching or hives, swelling of the face, lips, or tongue -breathing problems -confusion or changes in emotions or moods -constipation -cough -mouth sores -muscle weakness -nausea and vomiting -pain, swelling, redness or irritation at the injection site -pain, tingling, numbness in the hands or feet -problems with balance, talking, walking -seizures -stomach pain -trouble passing urine or change in the amount of urine Side effects that usually do not require medical attention (report to your doctor or health care professional if they continue or are bothersome): -diarrhea -hair loss -jaw pain -loss of appetite This list may not describe all possible side effects. Call your doctor for medical advice about side effects. You may report side effects to FDA at 1-800-FDA-1088. Where should I keep my medicine? This drug is given in a hospital or clinic and will not be stored at home. NOTE: This sheet is a summary. It may not cover all possible information. If you have questions about this medicine, talk to your doctor, pharmacist, or health care provider.  2019  Elsevier/Gold Standard (2008-07-12 17:17:13) Cyclophosphamide injection What is this medicine? CYCLOPHOSPHAMIDE (sye kloe FOSS fa mide) is a chemotherapy drug. It slows the growth of cancer cells. This medicine is used to treat many types of cancer like lymphoma, myeloma, leukemia, breast cancer, and ovarian cancer, to name a few. This medicine may be used for other purposes; ask your health care provider or pharmacist if you have questions. COMMON BRAND NAME(S): Cytoxan, Neosar What should I tell my health care provider before I take this medicine? They need to know if you have any of these conditions: -blood disorders -history of other chemotherapy -infection -kidney disease -liver disease -recent or ongoing radiation therapy -tumors in the bone marrow -an unusual or allergic reaction to cyclophosphamide, other chemotherapy, other medicines, foods, dyes, or preservatives -pregnant or trying to get pregnant -breast-feeding How should I use this medicine? This drug is usually given as an injection into a vein or muscle or by infusion into a vein. It is administered in a hospital or clinic by a specially trained health care professional. Talk to your pediatrician regarding the use of this medicine in children.   Special care may be needed. Overdosage: If you think you have taken too much of this medicine contact a poison control center or emergency room at once. NOTE: This medicine is only for you. Do not share this medicine with others. What if I miss a dose? It is important not to miss your dose. Call your doctor or health care professional if you are unable to keep an appointment. What may interact with this medicine? This medicine may interact with the following medications: -amiodarone -amphotericin B -azathioprine -certain antiviral medicines for HIV or AIDS such as protease inhibitors (e.g., indinavir, ritonavir) and zidovudine -certain blood pressure medications such as  benazepril, captopril, enalapril, fosinopril, lisinopril, moexipril, monopril, perindopril, quinapril, ramipril, trandolapril -certain cancer medications such as anthracyclines (e.g., daunorubicin, doxorubicin), busulfan, cytarabine, paclitaxel, pentostatin, tamoxifen, trastuzumab -certain diuretics such as chlorothiazide, chlorthalidone, hydrochlorothiazide, indapamide, metolazone -certain medicines that treat or prevent blood clots like warfarin -certain muscle relaxants such as succinylcholine -cyclosporine -etanercept -indomethacin -medicines to increase blood counts like filgrastim, pegfilgrastim, sargramostim -medicines used as general anesthesia -metronidazole -natalizumab This list may not describe all possible interactions. Give your health care provider a list of all the medicines, herbs, non-prescription drugs, or dietary supplements you use. Also tell them if you smoke, drink alcohol, or use illegal drugs. Some items may interact with your medicine. What should I watch for while using this medicine? Visit your doctor for checks on your progress. This drug may make you feel generally unwell. This is not uncommon, as chemotherapy can affect healthy cells as well as cancer cells. Report any side effects. Continue your course of treatment even though you feel ill unless your doctor tells you to stop. Drink water or other fluids as directed. Urinate often, even at night. In some cases, you may be given additional medicines to help with side effects. Follow all directions for their use. Call your doctor or health care professional for advice if you get a fever, chills or sore throat, or other symptoms of a cold or flu. Do not treat yourself. This drug decreases your body's ability to fight infections. Try to avoid being around people who are sick. This medicine may increase your risk to bruise or bleed. Call your doctor or health care professional if you notice any unusual bleeding. Be  careful brushing and flossing your teeth or using a toothpick because you may get an infection or bleed more easily. If you have any dental work done, tell your dentist you are receiving this medicine. You may get drowsy or dizzy. Do not drive, use machinery, or do anything that needs mental alertness until you know how this medicine affects you. Do not become pregnant while taking this medicine or for 1 year after stopping it. Women should inform their doctor if they wish to become pregnant or think they might be pregnant. Men should not father a child while taking this medicine and for 4 months after stopping it. There is a potential for serious side effects to an unborn child. Talk to your health care professional or pharmacist for more information. Do not breast-feed an infant while taking this medicine. This medicine may interfere with the ability to have a child. This medicine has caused ovarian failure in some women. This medicine has caused reduced sperm counts in some men. You should talk with your doctor or health care professional if you are concerned about your fertility. If you are going to have surgery, tell your doctor or health care professional that you have taken   this medicine. What side effects may I notice from receiving this medicine? Side effects that you should report to your doctor or health care professional as soon as possible: -allergic reactions like skin rash, itching or hives, swelling of the face, lips, or tongue -low blood counts - this medicine may decrease the number of white blood cells, red blood cells and platelets. You may be at increased risk for infections and bleeding. -signs of infection - fever or chills, cough, sore throat, pain or difficulty passing urine -signs of decreased platelets or bleeding - bruising, pinpoint red spots on the skin, black, tarry stools, blood in the urine -signs of decreased red blood cells - unusually weak or tired, fainting spells,  lightheadedness -breathing problems -dark urine -dizziness -palpitations -swelling of the ankles, feet, hands -trouble passing urine or change in the amount of urine -weight gain -yellowing of the eyes or skin Side effects that usually do not require medical attention (report to your doctor or health care professional if they continue or are bothersome): -changes in nail or skin color -hair loss -missed menstrual periods -mouth sores -nausea, vomiting This list may not describe all possible side effects. Call your doctor for medical advice about side effects. You may report side effects to FDA at 1-800-FDA-1088. Where should I keep my medicine? This drug is given in a hospital or clinic and will not be stored at home. NOTE: This sheet is a summary. It may not cover all possible information. If you have questions about this medicine, talk to your doctor, pharmacist, or health care provider.  2019 Elsevier/Gold Standard (2012-08-29 16:22:58)  

## 2019-04-06 NOTE — Procedures (Signed)
Interventional Radiology Procedure Note  Procedure: Placement of a right IJ approach single lumen PowerPort.  Tip is positioned at the superior cavoatrial junction and catheter is ready for immediate use.  Complications: None Recommendations:  - Ok to shower tomorrow - Do not submerge for 7 days - Routine line care   Signed,  Akyah Lagrange S. Ellizabeth Dacruz, DO   

## 2019-04-07 ENCOUNTER — Other Ambulatory Visit: Payer: Self-pay | Admitting: *Deleted

## 2019-04-07 ENCOUNTER — Telehealth: Payer: Self-pay | Admitting: *Deleted

## 2019-04-07 ENCOUNTER — Inpatient Hospital Stay: Payer: BLUE CROSS/BLUE SHIELD

## 2019-04-07 ENCOUNTER — Other Ambulatory Visit: Payer: Self-pay

## 2019-04-07 MED ORDER — PREDNISONE 50 MG PO TABS: 100.0000 mg | ORAL_TABLET | Freq: Every day | ORAL | 0 refills | Status: DC

## 2019-04-07 NOTE — Telephone Encounter (Signed)
Called patient and still no answer on pathology. Dr Janese Banks would now like to start steroid today. It will be 100 mg once a day with food and please take in am. Went over with patient about side effects. She would like to start her on the steroid today. I have sent it to pharmacy.  I will give her update tom. About whether we have pathology back and if we get auth from insurance. Pt. agreeable

## 2019-04-08 ENCOUNTER — Other Ambulatory Visit: Payer: Self-pay

## 2019-04-08 ENCOUNTER — Encounter: Payer: Self-pay | Admitting: Surgery

## 2019-04-08 ENCOUNTER — Ambulatory Visit (INDEPENDENT_AMBULATORY_CARE_PROVIDER_SITE_OTHER): Payer: BLUE CROSS/BLUE SHIELD | Admitting: Surgery

## 2019-04-08 ENCOUNTER — Encounter: Payer: Self-pay | Admitting: Oncology

## 2019-04-08 ENCOUNTER — Ambulatory Visit
Admission: RE | Admit: 2019-04-08 | Discharge: 2019-04-08 | Disposition: A | Discharge status: Home / Self Care | Payer: BLUE CROSS/BLUE SHIELD | Source: Ambulatory Visit | Attending: Oncology | Admitting: Oncology

## 2019-04-08 ENCOUNTER — Other Ambulatory Visit
Admission: RE | Admit: 2019-04-08 | Discharge: 2019-04-08 | Disposition: A | Discharge status: Home / Self Care | Payer: BLUE CROSS/BLUE SHIELD | Source: Ambulatory Visit | Attending: Surgery | Admitting: Surgery

## 2019-04-08 VITALS — BP 132/91 | HR 100 | Temp 97.7°F | Resp 14 | Ht 65.0 in | Wt 182.8 lb

## 2019-04-08 DIAGNOSIS — C8203 Follicular lymphoma grade I, intra-abdominal lymph nodes: Secondary | ICD-10-CM | POA: Insufficient documentation

## 2019-04-08 DIAGNOSIS — K219 Gastro-esophageal reflux disease without esophagitis: Secondary | ICD-10-CM | POA: Diagnosis not present

## 2019-04-08 DIAGNOSIS — Z1159 Encounter for screening for other viral diseases: Secondary | ICD-10-CM | POA: Diagnosis not present

## 2019-04-08 LAB — SURGICAL PATHOLOGY

## 2019-04-08 LAB — ECHOCARDIOGRAM COMPLETE
Height: 65 in
Weight: 2924.8 oz

## 2019-04-08 NOTE — Progress Notes (Signed)
*  PRELIMINARY RESULTS* Echocardiogram 2D Echocardiogram has been performed.  Sherrie Sport 04/08/2019, 10:38 AM

## 2019-04-08 NOTE — Patient Instructions (Addendum)
The patient is aware to call back for any questions or new concerns.  Patient's surgery to be scheduled for 04-10-19 at [facility] with Dr. Dahlia Byes.  The patient is aware to have COVID-19 testing done on 04-08-19 immediately after enho at the [place] building drive thru ([address] [place]) between 10:30 am and 12:30 pm. She is aware to isolate after, have no visitors, wash hands frequently, and avoid touching face.   The patient is aware she will be contacted by the [place] to complete a phone interview sometime in the near future.  Patient aware to be NPO after midnight and have a driver.   She is aware to check in at the [place] entrance where she will be screened for the coronavirus and then sent to Same Day Surgery.   Patient aware that she may have no visitors and driver will need to wait in the car due to COVID-19 restrictions.

## 2019-04-08 NOTE — H&P (View-Only) (Signed)
Patient ID: Andrea Lloyd, female   DOB: [birth year], 63 y.o.   MRN: [id number]  HPI Andrea Lloyd is a 63 y.o. female seen in consultation at the request of Dr. Janese Banks. Recently diagnosed with lymphoma after experiencing some left sided flank pain. W/U revealed a large retroperitoneal mass c/w lymphoma causing left Hydro. She underwent PET CT that I have personally reviewed and shared with the pt. She does have a left neck LAD 3cm, large para-aortic lymph node complex. She is s/p port and core biopsies of the retroperitoneum that were inconclusive. Urgent referal for excisonal biopsy of the neck to start steroid treatment. She denies any weight loss, night sweats, no airwy compromise She is able to perform more than 4 METS w/o SOB or c/p. INR, cbc and bmp reviewed. Case d/w Dr. Janese Banks in detail   HPI  Past Medical History:  Diagnosis Date  . Asthma   . Family history of adverse reaction to anesthesia    Father - slow to wake  . GERD (gastroesophageal reflux disease)   . Gravida 4 para 4   . Lymphoma [facility])     Past Surgical History:  Procedure Laterality Date  . ABDOMINAL HYSTERECTOMY     06 01 2013  . BLADDER SURGERY    . COLONOSCOPY WITH PROPOFOL N/A 02/25/2017   Procedure: COLONOSCOPY WITH PROPOFOL;  Surgeon: Lucilla Lame, MD;  Location: [place];  Service: Endoscopy;  Laterality: N/A;  . CYSTOSCOPY W/ URETERAL STENT PLACEMENT Left 03/28/2019   Procedure: CYSTOSCOPY WITH RETROGRADE PYELOGRAM/URETERAL STENT PLACEMENT;  Surgeon: Abbie Sons, MD;  Location: [facility];  Service: Urology;  Laterality: Left;  . IR IMAGING GUIDED PORT INSERTION  04/06/2019  . POLYPECTOMY  02/25/2017   Procedure: POLYPECTOMY;  Surgeon: Lucilla Lame, MD;  Location: [place];  Service: Endoscopy;;  . ROTATOR CUFF REPAIR Right   . TONSILLECTOMY  1972    Family History  Problem Relation Age of Onset  . Alcohol abuse Maternal Grandfather   . Healthy Mother   . Cancer Father   .  Healthy Sister   . Healthy Brother   . Breast cancer Maternal Aunt   . Hyperlipidemia Neg Hx   . Heart disease Neg Hx   . Diabetes Neg Hx     Social History Social History   Tobacco Use  . Smoking status: Never Smoker  . Smokeless tobacco: Never Used  Substance Use Topics  . Alcohol use: No  . Drug use: No    Allergies  Allergen Reactions  . Adhesive [Tape] Rash    Bandaids    Current Outpatient Medications  Medication Sig Dispense Refill  . albuterol (PROVENTIL HFA) 108 (90 Base) MCG/ACT inhaler Inhale 2 puffs into the lungs every 6 (six) hours as needed for wheezing or shortness of breath.     . allopurinol (ZYLOPRIM) 300 MG tablet Take 1 tablet (300 mg total) by mouth 2 (two) times daily. 60 tablet 2  . Cholecalciferol (VITAMIN D3 PO) Take 20,000 mcg by mouth daily.    . Menaquinone-7 (VITAMIN K2 PO) Take 200 mcg by mouth daily.    . Multiple Vitamin (MULTI-VITAMIN DAILY PO) Take 1 tablet by mouth daily.     Marland Kitchen oxybutynin (DITROPAN) 5 MG tablet Take 1 tablet (5 mg total) by mouth every 8 (eight) hours as needed for bladder spasms. (Patient not taking: Reported on 04/08/2019) 30 tablet 0  . predniSONE (DELTASONE) 50 MG tablet Take 2 tablets (100 mg total) by mouth  daily with breakfast. (Patient not taking: Reported on 04/08/2019) 10 tablet 0  . Probiotic Product (PROBIOTIC PO) Take by mouth daily.     No current facility-administered medications for this visit.      Review of Systems Full ROS  was asked and was negative except for the information on the HPI  Physical Exam Blood pressure (!) 132/91, pulse 100, temperature 97.7 F (36.5 C), temperature source Temporal, resp. rate 14, height 5\' 5"  (1.651 m), weight 182 lb 12.8 oz (82.9 kg), SpO2 96 %. CONSTITUTIONAL: NAD EYES: Pupils are equal, round, and reactive to light, Sclera are non-icteric. EARS, NOSE, MOUTH AND THROAT: The oropharynx is clear. The oral mucosa is pink and moist. Hearing is intact to voice. Neck:  supraclavicular LAD 3cms   No evidence of tracheal deviation or airway compromise. No stridor. Normal voice.  RESPIRATORY:  Lungs are clear. There is normal respiratory effort, with equal breath sounds bilaterally, and without pathologic use of accessory muscles. CARDIOVASCULAR: Heart is regular without murmurs, gallops, or rubs. GI: The abdomen is soft, nontender, and nondistended. There are no palpable masses. There is no hepatosplenomegaly. There are normal bowel sounds in all quadrants. GU: Rectal deferred.   MUSCULOSKELETAL: Normal muscle strength and tone. No cyanosis or edema. There is a resolving hematoma left hip measure 5x5 cms, non expanding.   SKIN: Turgor is good and there are no pathologic skin lesions or ulcers. NEUROLOGIC: Motor and sensation is grossly normal. Cranial nerves are grossly intact. PSYCH:  Oriented to person, place and time. Affect is normal.  Data Reviewed  I have personally reviewed the patient's imaging, laboratory findings and medical records.    Assessment/Plan 63 yo female w lymphoma and extensive LAD, in need for excisional biopsy of large neck Lymph node to start appropriate treatment. I do think that excisional biopsy of the left neck node is indicated.Procedure, d/w the pt in detail, risks, benefits and possible complications d/w the pt in detail ( CN injury, XI,  facial nerve, hypoglosal, RLN  and vagal nerve injuries d/w the pt in detail) .  She understands and wishes to proceed. A copy of this report was sent to the referring provider.  Caroleen Hamman, MD FACS General Surgeon 04/08/2019, 9:04 AM

## 2019-04-08 NOTE — Progress Notes (Signed)
Patient ID: Andrea Lloyd, female   DOB: 05/11/1956, 63 y.o.   MRN: [id number]  HPI Andrea Lloyd is a 63 y.o. female seen in consultation at the request of Dr. Janese Banks. Recently diagnosed with lymphoma after experiencing some left sided flank pain. W/U revealed a large retroperitoneal mass c/w lymphoma causing left Hydro. She underwent PET CT that I have personally reviewed and shared with the pt. She does have a left neck LAD 3cm, large para-aortic lymph node complex. She is s/p port and core biopsies of the retroperitoneum that were inconclusive. Urgent referal for excisonal biopsy of the neck to start steroid treatment. She denies any weight loss, night sweats, no airwy compromise She is able to perform more than 4 METS w/o SOB or c/p. INR, cbc and bmp reviewed. Case d/w Dr. Janese Banks in detail   HPI  Past Medical History:  Diagnosis Date  . Asthma   . Family history of adverse reaction to anesthesia    Father - slow to wake  . GERD (gastroesophageal reflux disease)   . Gravida 4 para 4   . Lymphoma [facility])     Past Surgical History:  Procedure Laterality Date  . ABDOMINAL HYSTERECTOMY     06 01 2013  . BLADDER SURGERY    . COLONOSCOPY WITH PROPOFOL N/A 02/25/2017   Procedure: COLONOSCOPY WITH PROPOFOL;  Surgeon: Lucilla Lame, MD;  Location: [place];  Service: Endoscopy;  Laterality: N/A;  . CYSTOSCOPY W/ URETERAL STENT PLACEMENT Left 03/28/2019   Procedure: CYSTOSCOPY WITH RETROGRADE PYELOGRAM/URETERAL STENT PLACEMENT;  Surgeon: Abbie Sons, MD;  Location: [facility];  Service: Urology;  Laterality: Left;  . IR IMAGING GUIDED PORT INSERTION  04/06/2019  . POLYPECTOMY  02/25/2017   Procedure: POLYPECTOMY;  Surgeon: Lucilla Lame, MD;  Location: [place];  Service: Endoscopy;;  . ROTATOR CUFF REPAIR Right   . TONSILLECTOMY  1972    Family History  Problem Relation Age of Onset  . Alcohol abuse Maternal Grandfather   . Healthy Mother   . Cancer Father   .  Healthy Sister   . Healthy Brother   . Breast cancer Maternal Aunt   . Hyperlipidemia Neg Hx   . Heart disease Neg Hx   . Diabetes Neg Hx     Social History Social History   Tobacco Use  . Smoking status: Never Smoker  . Smokeless tobacco: Never Used  Substance Use Topics  . Alcohol use: No  . Drug use: No    Allergies  Allergen Reactions  . Adhesive [Tape] Rash    Bandaids    Current Outpatient Medications  Medication Sig Dispense Refill  . albuterol (PROVENTIL HFA) 108 (90 Base) MCG/ACT inhaler Inhale 2 puffs into the lungs every 6 (six) hours as needed for wheezing or shortness of breath.     . allopurinol (ZYLOPRIM) 300 MG tablet Take 1 tablet (300 mg total) by mouth 2 (two) times daily. 60 tablet 2  . Cholecalciferol (VITAMIN D3 PO) Take 20,000 mcg by mouth daily.    . Menaquinone-7 (VITAMIN K2 PO) Take 200 mcg by mouth daily.    . Multiple Vitamin (MULTI-VITAMIN DAILY PO) Take 1 tablet by mouth daily.     Marland Kitchen oxybutynin (DITROPAN) 5 MG tablet Take 1 tablet (5 mg total) by mouth every 8 (eight) hours as needed for bladder spasms. (Patient not taking: Reported on 04/08/2019) 30 tablet 0  . predniSONE (DELTASONE) 50 MG tablet Take 2 tablets (100 mg total) by mouth  daily with breakfast. (Patient not taking: Reported on 04/08/2019) 10 tablet 0  . Probiotic Product (PROBIOTIC PO) Take by mouth daily.     No current facility-administered medications for this visit.      Review of Systems Full ROS  was asked and was negative except for the information on the HPI  Physical Exam Blood pressure (!) 132/91, pulse 100, temperature 97.7 F (36.5 C), temperature source Temporal, resp. rate 14, height 5\' 5"  (1.651 m), weight 182 lb 12.8 oz (82.9 kg), SpO2 96 %. CONSTITUTIONAL: NAD EYES: Pupils are equal, round, and reactive to light, Sclera are non-icteric. EARS, NOSE, MOUTH AND THROAT: The oropharynx is clear. The oral mucosa is pink and moist. Hearing is intact to voice. Neck:  supraclavicular LAD 3cms   No evidence of tracheal deviation or airway compromise. No stridor. Normal voice.  RESPIRATORY:  Lungs are clear. There is normal respiratory effort, with equal breath sounds bilaterally, and without pathologic use of accessory muscles. CARDIOVASCULAR: Heart is regular without murmurs, gallops, or rubs. GI: The abdomen is soft, nontender, and nondistended. There are no palpable masses. There is no hepatosplenomegaly. There are normal bowel sounds in all quadrants. GU: Rectal deferred.   MUSCULOSKELETAL: Normal muscle strength and tone. No cyanosis or edema. There is a resolving hematoma left hip measure 5x5 cms, non expanding.   SKIN: Turgor is good and there are no pathologic skin lesions or ulcers. NEUROLOGIC: Motor and sensation is grossly normal. Cranial nerves are grossly intact. PSYCH:  Oriented to person, place and time. Affect is normal.  Data Reviewed  I have personally reviewed the patient's imaging, laboratory findings and medical records.    Assessment/Plan 63 yo female w lymphoma and extensive LAD, in need for excisional biopsy of large neck Lymph node to start appropriate treatment. I do think that excisional biopsy of the left neck node is indicated.Procedure, d/w the pt in detail, risks, benefits and possible complications d/w the pt in detail ( CN injury, XI,  facial nerve, hypoglosal, RLN  and vagal nerve injuries d/w the pt in detail) .  She understands and wishes to proceed. A copy of this report was sent to the referring provider.  Caroleen Hamman, MD FACS General Surgeon 04/08/2019, 9:04 AM

## 2019-04-09 ENCOUNTER — Inpatient Hospital Stay: Payer: BLUE CROSS/BLUE SHIELD

## 2019-04-09 ENCOUNTER — Other Ambulatory Visit: Payer: Self-pay

## 2019-04-09 ENCOUNTER — Inpatient Hospital Stay: Payer: BLUE CROSS/BLUE SHIELD | Admitting: Oncology

## 2019-04-09 ENCOUNTER — Encounter
Admission: RE | Admit: 2019-04-09 | Discharge: 2019-04-09 | Disposition: A | Discharge status: Home / Self Care | Payer: BLUE CROSS/BLUE SHIELD | Source: Ambulatory Visit | Attending: Surgery | Admitting: Surgery

## 2019-04-09 LAB — NOVEL CORONAVIRUS, NAA (HOSP ORDER, SEND-OUT TO REF LAB; TAT 18-24 HRS): SARS-CoV-2, NAA: NOT DETECTED

## 2019-04-09 MED ORDER — CEFAZOLIN SODIUM-DEXTROSE 2-4 GM/100ML-% IV SOLN
2.0000 g | INTRAVENOUS | Status: AC
  Administered 2019-04-10: 2 g via INTRAVENOUS

## 2019-04-09 NOTE — Patient Instructions (Signed)
Your procedure is scheduled on: 04/10/2019 Fri 11:00 am Report to Same Day Surgery 2nd floor medical mall [facility] Entrance-take elevator on left to 2nd floor.  Check in with surgery information desk.)  Remember: Instructions that are not followed completely may result in serious medical risk, up to and including death, or upon the discretion of your surgeon and anesthesiologist your surgery may need to be rescheduled.    _x___ 1. Do not eat food after midnight the night before your procedure. You may drink clear liquids up to 2 hours before you are scheduled to arrive at the hospital for your procedure.  Do not drink clear liquids within 2 hours of your scheduled arrival to the hospital.  Clear liquids include  --Water or Apple juice without pulp  --Clear carbohydrate beverage such as ClearFast or Gatorade  --Black Coffee or Clear Tea (No milk, no creamers, do not add anything to                  the coffee or Tea Type 1 and type 2 diabetics should only drink water.   ____Ensure clear carbohydrate drink on the way to the hospital for bariatric patients  ____Ensure clear carbohydrate drink 3 hours before surgery for Dr Dwyane Luo patients if physician instructed.   No gum chewing or hard candies.     __x__ 2. No Alcohol for 24 hours before or after surgery.   __x__3. No Smoking or e-cigarettes for 24 prior to surgery.  Do not use any chewable tobacco products for at least 6 hour prior to surgery   ____  4. Bring all medications with you on the day of surgery if instructed.    __x__ 5. Notify your doctor if there is any change in your medical condition     (cold, fever, infections).    x___6. On the morning of surgery brush your teeth with toothpaste and water.  You may rinse your mouth with mouth wash if you wish.  Do not swallow any toothpaste or mouthwash.   Do not wear jewelry, make-up, hairpins, clips or nail polish.  Do not wear lotions, powders, or perfumes. You may wear  deodorant.  Do not shave 48 hours prior to surgery. Men may shave face and neck.  Do not bring valuables to the hospital.    [facility] is not responsible for any belongings or valuables.               Contacts, dentures or bridgework may not be worn into surgery.  Leave your suitcase in the car. After surgery it may be brought to your room.  For patients admitted to the hospital, discharge time is determined by your                       treatment team.  _  Patients discharged the day of surgery will not be allowed to drive home.  You will need someone to drive you home and stay with you the night of your procedure.    Please read over the following fact sheets that you were given:   [facility] Preparing for Surgery and or MRSA Information   _x___ Take anti-hypertensive listed below, cardiac, seizure, asthma,     anti-reflux and psychiatric medicines. These include:  1. albuterol (PROVENTIL HFA) 108 (90 Base) MCG/ACT inhaler  2.allopurinol (ZYLOPRIM) 300 MG tablet  3.  4.  5.  6.  ____Fleets enema or Magnesium Citrate as directed.   _x___ Use  CHG Soap or sage wipes as directed on instruction sheet   ____ Use inhalers on the day of surgery and bring to hospital day of surgery  ____ Stop Metformin and Janumet 2 days prior to surgery.    ____ Take 1/2 of usual insulin dose the night before surgery and none on the morning     surgery.   _x___ Follow recommendations from Cardiologist, Pulmonologist or PCP regarding          stopping Aspirin, Coumadin, Plavix ,Eliquis, Effient, or Pradaxa, and Pletal.  X____Stop Anti-inflammatories such as Advil, Aleve, Ibuprofen, Motrin, Naproxen, Naprosyn, Goodies powders or aspirin products. OK to take Tylenol and                          Celebrex.   _x___ Stop supplements until after surgery.  But may continue Vitamin D, Vitamin B,       and multivitamin.   ____ Bring C-Pap to the hospital.

## 2019-04-10 ENCOUNTER — Ambulatory Visit
Admission: RE | Admit: 2019-04-10 | Discharge: 2019-04-10 | Disposition: A | Discharge status: Home / Self Care | Payer: BLUE CROSS/BLUE SHIELD | Attending: Surgery | Admitting: Surgery

## 2019-04-10 ENCOUNTER — Other Ambulatory Visit: Payer: Self-pay

## 2019-04-10 ENCOUNTER — Encounter: Payer: Self-pay | Admitting: *Deleted

## 2019-04-10 ENCOUNTER — Ambulatory Visit: Payer: BLUE CROSS/BLUE SHIELD | Admitting: Anesthesiology

## 2019-04-10 ENCOUNTER — Encounter
Admission: RE | Disposition: A | Discharge status: Home / Self Care | Payer: Self-pay | Source: Home / Self Care | Attending: Surgery

## 2019-04-10 DIAGNOSIS — Z79899 Other long term (current) drug therapy: Secondary | ICD-10-CM | POA: Diagnosis not present

## 2019-04-10 DIAGNOSIS — C8590 Non-Hodgkin lymphoma, unspecified, unspecified site: Secondary | ICD-10-CM | POA: Diagnosis present

## 2019-04-10 DIAGNOSIS — C8231 Follicular lymphoma grade IIIa, lymph nodes of head, face, and neck: Secondary | ICD-10-CM | POA: Insufficient documentation

## 2019-04-10 DIAGNOSIS — C8203 Follicular lymphoma grade I, intra-abdominal lymph nodes: Secondary | ICD-10-CM | POA: Diagnosis not present

## 2019-04-10 DIAGNOSIS — J45909 Unspecified asthma, uncomplicated: Secondary | ICD-10-CM | POA: Diagnosis not present

## 2019-04-10 HISTORY — PX: MASS BIOPSY: SHX5445

## 2019-04-10 SURGERY — BIOPSY, MASS, NECK
Anesthesia: General | Laterality: Left

## 2019-04-10 MED ORDER — ACETAMINOPHEN 500 MG PO TABS
1000.0000 mg | ORAL_TABLET | ORAL | Status: AC
  Administered 2019-04-10: 1000 mg via ORAL

## 2019-04-10 MED ORDER — GABAPENTIN 300 MG PO CAPS
300.0000 mg | ORAL_CAPSULE | ORAL | Status: AC
  Administered 2019-04-10: 300 mg via ORAL

## 2019-04-10 MED ORDER — FENTANYL CITRATE (PF) 100 MCG/2ML IJ SOLN
INTRAMUSCULAR | Status: AC
  Filled 2019-04-10: qty 2

## 2019-04-10 MED ORDER — LIDOCAINE HCL (CARDIAC) PF 100 MG/5ML IV SOSY
PREFILLED_SYRINGE | INTRAVENOUS | Status: DC | PRN
  Administered 2019-04-10: 80 mg via INTRAVENOUS

## 2019-04-10 MED ORDER — CHLORHEXIDINE GLUCONATE CLOTH 2 % EX PADS: 6.0000 | MEDICATED_PAD | Freq: Once | CUTANEOUS | Status: DC

## 2019-04-10 MED ORDER — CELECOXIB 200 MG PO CAPS
200.0000 mg | ORAL_CAPSULE | ORAL | Status: AC
  Administered 2019-04-10: 200 mg via ORAL

## 2019-04-10 MED ORDER — HYDROCODONE-ACETAMINOPHEN 5-325 MG PO TABS
1.0000 | ORAL_TABLET | Freq: Once | ORAL | Status: AC
  Administered 2019-04-10: 1 via ORAL

## 2019-04-10 MED ORDER — HYDROMORPHONE HCL 1 MG/ML IJ SOLN
INTRAMUSCULAR | Status: AC
  Administered 2019-04-10: 0.25 mg via INTRAVENOUS
  Filled 2019-04-10: qty 1

## 2019-04-10 MED ORDER — HYDROCODONE-ACETAMINOPHEN 5-325 MG PO TABS
ORAL_TABLET | ORAL | Status: AC
  Filled 2019-04-10: qty 1

## 2019-04-10 MED ORDER — PROMETHAZINE HCL 25 MG/ML IJ SOLN
INTRAMUSCULAR | Status: AC
  Administered 2019-04-10: 6.25 mg via INTRAVENOUS
  Filled 2019-04-10: qty 1

## 2019-04-10 MED ORDER — ROCURONIUM BROMIDE 100 MG/10ML IV SOLN
INTRAVENOUS | Status: DC | PRN
  Administered 2019-04-10: 20 mg via INTRAVENOUS

## 2019-04-10 MED ORDER — LACTATED RINGERS IV SOLN
INTRAVENOUS | Status: DC
  Administered 2019-04-10 (×2): via INTRAVENOUS

## 2019-04-10 MED ORDER — CELECOXIB 200 MG PO CAPS
ORAL_CAPSULE | ORAL | Status: AC
  Administered 2019-04-10: 10:00:00 200 mg via ORAL
  Filled 2019-04-10: qty 1

## 2019-04-10 MED ORDER — SUCCINYLCHOLINE CHLORIDE 20 MG/ML IJ SOLN
INTRAMUSCULAR | Status: DC | PRN
  Administered 2019-04-10: 80 mg via INTRAVENOUS

## 2019-04-10 MED ORDER — PROPOFOL 10 MG/ML IV BOLUS
INTRAVENOUS | Status: DC | PRN
  Administered 2019-04-10: 140 mg via INTRAVENOUS
  Administered 2019-04-10: 60 mg via INTRAVENOUS

## 2019-04-10 MED ORDER — HYDROMORPHONE HCL 1 MG/ML IJ SOLN
0.2500 mg | INTRAMUSCULAR | Status: DC | PRN
  Administered 2019-04-10 (×6): 0.25 mg via INTRAVENOUS

## 2019-04-10 MED ORDER — SODIUM CHLORIDE (PF) 0.9 % IJ SOLN
INTRAMUSCULAR | Status: AC
  Filled 2019-04-10: qty 10

## 2019-04-10 MED ORDER — GABAPENTIN 300 MG PO CAPS
ORAL_CAPSULE | ORAL | Status: AC
  Administered 2019-04-10: 10:00:00 300 mg via ORAL
  Filled 2019-04-10: qty 1

## 2019-04-10 MED ORDER — SUGAMMADEX SODIUM 200 MG/2ML IV SOLN
INTRAVENOUS | Status: DC | PRN
  Administered 2019-04-10: 175 mg via INTRAVENOUS

## 2019-04-10 MED ORDER — ACETAMINOPHEN 500 MG PO TABS
ORAL_TABLET | ORAL | Status: AC
  Administered 2019-04-10: 1000 mg via ORAL
  Filled 2019-04-10: qty 2

## 2019-04-10 MED ORDER — FAMOTIDINE 20 MG PO TABS
20.0000 mg | ORAL_TABLET | Freq: Once | ORAL | Status: AC
  Administered 2019-04-10: 20 mg via ORAL

## 2019-04-10 MED ORDER — DEXAMETHASONE SODIUM PHOSPHATE 10 MG/ML IJ SOLN
INTRAMUSCULAR | Status: DC | PRN
  Administered 2019-04-10: 5 mg via INTRAVENOUS

## 2019-04-10 MED ORDER — ONDANSETRON HCL 4 MG/2ML IJ SOLN
INTRAMUSCULAR | Status: DC | PRN
  Administered 2019-04-10: 4 mg via INTRAVENOUS

## 2019-04-10 MED ORDER — CEFAZOLIN SODIUM-DEXTROSE 2-4 GM/100ML-% IV SOLN
INTRAVENOUS | Status: AC
  Filled 2019-04-10: qty 100

## 2019-04-10 MED ORDER — FAMOTIDINE 20 MG PO TABS
ORAL_TABLET | ORAL | Status: AC
  Administered 2019-04-10: 10:00:00 20 mg via ORAL
  Filled 2019-04-10: qty 1

## 2019-04-10 MED ORDER — PROMETHAZINE HCL 25 MG/ML IJ SOLN
6.2500 mg | INTRAMUSCULAR | Status: DC | PRN
  Administered 2019-04-10: 15:00:00 6.25 mg via INTRAVENOUS

## 2019-04-10 MED ORDER — FENTANYL CITRATE (PF) 100 MCG/2ML IJ SOLN
INTRAMUSCULAR | Status: DC | PRN
  Administered 2019-04-10 (×2): 50 ug via INTRAVENOUS
  Administered 2019-04-10: 100 ug via INTRAVENOUS

## 2019-04-10 MED ORDER — HYDROCODONE-ACETAMINOPHEN 5-325 MG PO TABS: 1.0000 | ORAL_TABLET | Freq: Four times a day (QID) | ORAL | 0 refills | Status: DC | PRN

## 2019-04-10 MED ORDER — PHENYLEPHRINE HCL (PRESSORS) 10 MG/ML IV SOLN
INTRAVENOUS | Status: DC | PRN
  Administered 2019-04-10 (×4): 100 ug via INTRAVENOUS

## 2019-04-10 SURGICAL SUPPLY — 37 items
APPLIER CLIP 11 MED OPEN (CLIP) ×9
BLADE CLIPPER SURG (BLADE) ×3 IMPLANT
CHLORAPREP W/TINT 26 (MISCELLANEOUS) ×3 IMPLANT
CLIP APPLIE 11 MED OPEN (CLIP) ×3 IMPLANT
CNTNR SPEC 2.5X3XGRAD LEK (MISCELLANEOUS) ×1
CONT SPEC 4OZ STER OR WHT (MISCELLANEOUS) ×2
CONTAINER SPEC 2.5X3XGRAD LEK (MISCELLANEOUS) ×1 IMPLANT
COVER PROBE FLX POLY STRL (MISCELLANEOUS) ×3 IMPLANT
COVER WAND RF STERILE (DRAPES) ×3 IMPLANT
DERMABOND ADVANCED (GAUZE/BANDAGES/DRESSINGS) ×2
DERMABOND ADVANCED .7 DNX12 (GAUZE/BANDAGES/DRESSINGS) ×1 IMPLANT
DRAIN CHANNEL JP 10F RND 20C F (MISCELLANEOUS) ×3 IMPLANT
DRAPE INCISE IOBAN 66X45 STRL (DRAPES) ×3 IMPLANT
DRAPE LAPAROTOMY 100X77 ABD (DRAPES) ×3 IMPLANT
DRAPE SHEET LG 3/4 BI-LAMINATE (DRAPES) ×3 IMPLANT
DRSG TEGADERM 2-3/8X2-3/4 SM (GAUZE/BANDAGES/DRESSINGS) ×6 IMPLANT
ELECT REM PT RETURN 9FT ADLT (ELECTROSURGICAL) ×3
ELECTRODE REM PT RTRN 9FT ADLT (ELECTROSURGICAL) ×1 IMPLANT
GLOVE BIO SURGEON STRL SZ7 (GLOVE) ×6 IMPLANT
GOWN STRL REUS W/ TWL LRG LVL3 (GOWN DISPOSABLE) ×2 IMPLANT
GOWN STRL REUS W/TWL LRG LVL3 (GOWN DISPOSABLE) ×4
JACKSON PRATT 10 (INSTRUMENTS) ×3 IMPLANT
NEEDLE HYPO 22GX1.5 SAFETY (NEEDLE) ×3 IMPLANT
PACK BASIN MINOR ARMC (MISCELLANEOUS) ×3 IMPLANT
SPONGE DRAIN TRACH 4X4 STRL 2S (GAUZE/BANDAGES/DRESSINGS) ×3 IMPLANT
SPONGE KITTNER 5P (MISCELLANEOUS) ×9 IMPLANT
SPONGE LAP 18X18 RF (DISPOSABLE) ×3 IMPLANT
SUT MNCRL 4-0 (SUTURE) ×2
SUT MNCRL 4-0 27XMFL (SUTURE) ×1
SUT PLAIN 3 0 SH 27IN (SUTURE) ×6 IMPLANT
SUT SILK 3 0 (SUTURE) ×2
SUT SILK 3-0 18XBRD TIE 12 (SUTURE) ×1 IMPLANT
SUT VIC AB 3-0 SH 27 (SUTURE) ×2
SUT VIC AB 3-0 SH 27X BRD (SUTURE) ×1 IMPLANT
SUT VICRYL 3-0 SH-1 18IN (SUTURE) ×3 IMPLANT
SUTURE MNCRL 4-0 27XMF (SUTURE) ×1 IMPLANT
SYR 10ML LL (SYRINGE) ×3 IMPLANT

## 2019-04-10 NOTE — Anesthesia Post-op Follow-up Note (Signed)
Anesthesia QCDR form completed.        

## 2019-04-10 NOTE — Discharge Instructions (Addendum)

## 2019-04-10 NOTE — Anesthesia Procedure Notes (Signed)
Procedure Name: Intubation Date/Time: 04/10/2019 11:12 AM Performed by: Chanetta Marshall, CRNA Pre-anesthesia Checklist: Patient identified, Emergency Drugs available, Suction available and Patient being monitored Patient Re-evaluated:Patient Re-evaluated prior to induction Oxygen Delivery Method: Circle system utilized Preoxygenation: Pre-oxygenation with 100% oxygen Induction Type: IV induction Laryngoscope Size: Mac and 3 Grade View: Grade I Tube type: Oral Tube size: 7.0 mm Number of attempts: 1 Airway Equipment and Method: Stylet and Oral airway Placement Confirmation: ETT inserted through vocal cords under direct vision,  positive ETCO2,  breath sounds checked- equal and bilateral and CO2 detector Secured at: 21 cm Tube secured with: Tape Dental Injury: Teeth and Oropharynx as per pre-operative assessment

## 2019-04-10 NOTE — Transfer of Care (Signed)
Immediate Anesthesia Transfer of Care Note  Patient: Andrea Lloyd  Procedure(s) Performed: NECK MASS BIOPSY LEFT (Left )  Patient Location: PACU  Anesthesia Type:General  Level of Consciousness: awake, alert  and oriented  Airway & Oxygen Therapy: Patient Spontanous Breathing and Patient connected to face mask oxygen  Post-op Assessment: Report given to RN and Post -op Vital signs reviewed and stable  Post vital signs: Reviewed and stable  Last Vitals:  Vitals Value Taken Time  BP    Temp    Pulse    Resp    SpO2      Last Pain:  Vitals:   04/10/19 0943  TempSrc: Tympanic  PainSc: 0-No pain         Complications: No apparent anesthesia complications

## 2019-04-10 NOTE — Anesthesia Preprocedure Evaluation (Addendum)
Anesthesia Evaluation  Patient identified by MRN, date of birth, ID band Patient awake    Reviewed: Allergy & Precautions, H&P , NPO status , Patient's Chart, lab work & pertinent test results  History of Anesthesia Complications (+) Family history of anesthesia reaction and history of anesthetic complications (father - slow to wake up)  Airway Mallampati: I  TM Distance: >3 FB Neck ROM: full    Dental  (+) Teeth Intact   Pulmonary asthma ,           Cardiovascular negative cardio ROS       Neuro/Psych negative neurological ROS  negative psych ROS   GI/Hepatic Neg liver ROS, GERD  Controlled,  Endo/Other  negative endocrine ROS  Renal/GU Renal disease (hydronephrosis)     Musculoskeletal   Abdominal   Peds  Hematology lymphoma   Anesthesia Other Findings Past Medical History: No date: Asthma No date: Family history of adverse reaction to anesthesia     Comment:  Father - slow to wake No date: GERD (gastroesophageal reflux disease) No date: Gravida 4 para 4 No date: Lymphoma [facility])  Past Surgical History: No date: ABDOMINAL HYSTERECTOMY     Comment:  06 01 2013 No date: BLADDER SURGERY 02/25/2017: COLONOSCOPY WITH PROPOFOL; N/A     Comment:  Procedure: COLONOSCOPY WITH PROPOFOL;  Surgeon: Lucilla Lame, MD;  Location: [place];  Service:               Endoscopy;  Laterality: N/A; 03/28/2019: CYSTOSCOPY W/ URETERAL STENT PLACEMENT; Left     Comment:  Procedure: CYSTOSCOPY WITH RETROGRADE PYELOGRAM/URETERAL              STENT PLACEMENT;  Surgeon: Abbie Sons, MD;                Location: [facility];  Service: Urology;  Laterality: Left; 04/06/2019: IR IMAGING GUIDED PORT INSERTION 02/25/2017: POLYPECTOMY     Comment:  Procedure: POLYPECTOMY;  Surgeon: Lucilla Lame, MD;                Location: [place];  Service: Endoscopy;; No date: PORT A CATH INJECTION ([place] HX) No date:  ROTATOR CUFF REPAIR; Right 1972: TONSILLECTOMY     Reproductive/Obstetrics negative OB ROS                           Anesthesia Physical Anesthesia Plan  ASA: II  Anesthesia Plan: General ETT   Post-op Pain Management:    Induction:   PONV Risk Score and Plan: Ondansetron, Dexamethasone, Midazolam and Treatment may vary due to age or medical condition  Airway Management Planned:   Additional Equipment:   Intra-op Plan:   Post-operative Plan:   Informed Consent: I have reviewed the patients History and Physical, chart, labs and discussed the procedure including the risks, benefits and alternatives for the proposed anesthesia with the patient or authorized representative who has indicated his/her understanding and acceptance.     Dental Advisory Given  Plan Discussed with: Anesthesiologist and CRNA  Anesthesia Plan Comments:         Anesthesia Quick Evaluation

## 2019-04-10 NOTE — Interval H&P Note (Signed)
History and Physical Interval Note:  04/10/2019 9:59 AM  Andrea Lloyd  has presented today for surgery, with the diagnosis of C82.03 LYMPHOMA.  The various methods of treatment have been discussed with the patient and family. After consideration of risks, benefits and other options for treatment, the patient has consented to  Procedure(s): NECK MASS BIOPSY LEFT (Left) as a surgical intervention.  The patient's history has been reviewed, patient examined, no change in status, stable for surgery.  I have reviewed the patient's chart and labs.  Questions were answered to the patient's satisfaction.     [place]

## 2019-04-10 NOTE — Op Note (Signed)
PROCEDURES: Left lateral Neck dissection Level III, IV  Pre-operative Diagnosis: Lymphoma  Post-operative Diagnosis: same  Surgeon: Marjory Lies Pabon   Anesthesia: General endotracheal anesthesia  ASA Class: 2  Surgeon: Caroleen Hamman , MD FACS  Anesthesia: Gen. with endotracheal tube  Findings: Large conglomerate of lymphadenopathy within level III, IV of the left Neck requiring a Lymph node dissection and not just an excisional biopsy due to the nature of the LAD.  Estimated Blood Loss: 10cc         Drains: 10 blake          Specimens: Left nodal basin III, IV        Complications: none               Condition: stable  Procedure Details  The patient was seen again in the Holding Room. The benefits, complications, treatment options, and expected outcomes were discussed with the patient. The risks of bleeding, infection, Nerve injury to include( RLN, Vagus, Cervical chain and XI CN) any of which could require further surgery were reviewed with the patient.   The patient was taken to Operating Room, identified as Andrea Lloyd and the procedure verified.  A Time Out was held and the above information confirmed.  Prior to the induction of general anesthesia, antibiotic prophylaxis was administered. VTE prophylaxis was in place. General endotracheal anesthesia was then administered and tolerated well. After the induction, the neck was prepped with Chloraprep and draped in the sterile fashion. The patient was positioned in the supine position.  Transverse cervical incision was created.  The platysma was divided and subplatysmal flaps were developed.  Contour the medial head of the sternocleidal muscle that was divided with cautery.  We dissected free the internal jugular vein and preserved at all times.  We also preserve the carotid artery.  Attention then was turned to the level 3 and 4 nodal basin.  There was a large conglomerate of lymph nodes that were not easily excised  individually and require neck dissection of level 3 and 4 due to the nature of the disease.  Very meticulous dissection was performed to avoid any injuries to the cranial nerve, specifically identified cranial nerve XI so were very cognizant about the position of the recurrent laryngeal nerve.  Using Kitner we dissected the nodal basin on clipped and ligated the feeding vessels to the nodal basin in the standard fashion.  There was very good hemostasis.  Physically decided not to do an extensive lymph node dissection because this was certainly an off lymph node material.  The specimen was sent fresh for lymphoma work-up.  The wound was irrigated and there was excellent hemostasis.  There was no evidence of any nerve injury.  The extensive lymphadenopathy I decided to place a 10 Blake drain within the space.  The drain was secured with a 3-0 nylon in the standard fashion.  The wound was closed with multiple layers closing the sternocleido muscle fascia as well as the platysmas fascia.  Skin was closed in a subcuticular fashion with 4-0 Monocryl and Dermabond was applied.  Needle and laparotomy counts were correct. No complications were observed.   Caroleen Hamman, MD, FACS

## 2019-04-13 ENCOUNTER — Telehealth: Payer: Self-pay | Admitting: *Deleted

## 2019-04-13 ENCOUNTER — Other Ambulatory Visit: Payer: Self-pay

## 2019-04-13 ENCOUNTER — Encounter: Payer: Self-pay | Admitting: Surgery

## 2019-04-13 ENCOUNTER — Ambulatory Visit (INDEPENDENT_AMBULATORY_CARE_PROVIDER_SITE_OTHER): Payer: BLUE CROSS/BLUE SHIELD | Admitting: Surgery

## 2019-04-13 VITALS — BP 125/86 | HR 96 | Temp 97.3°F | Resp 16 | Ht 65.0 in | Wt 185.6 lb

## 2019-04-13 DIAGNOSIS — Z09 Encounter for follow-up examination after completed treatment for conditions other than malignant neoplasm: Secondary | ICD-10-CM

## 2019-04-13 NOTE — Telephone Encounter (Signed)
Called to the patients mobile and left message that we do not have path yet but soon and hope to put her on for Friday and everything be in place and if not I will call her back. Also that she can start back on prednisone starting today make sure she eats and take asap. Then Friday be at cancer center 8 am for port flush, see md and treatment. Asked her to call me back on cell phone to make sure she got my message

## 2019-04-13 NOTE — Progress Notes (Signed)
S/p Left neck dissection/ excision Drain output about 20cc day last 12 hrs less than 3cc No fevers or chills Some pain Swallowing well Some Right UE numbness that is resolving  PE NAD Drain serous, removed. No evidence of hematoma, wound healing well w/o infection  A/p Doing well, some expected pain Pending path RTC virtual visit 2 weeks

## 2019-04-13 NOTE — Patient Instructions (Addendum)
Return in two weeks.  

## 2019-04-14 ENCOUNTER — Inpatient Hospital Stay: Payer: BLUE CROSS/BLUE SHIELD

## 2019-04-14 ENCOUNTER — Other Ambulatory Visit: Payer: Self-pay | Admitting: Oncology

## 2019-04-14 ENCOUNTER — Inpatient Hospital Stay: Payer: BLUE CROSS/BLUE SHIELD | Admitting: Oncology

## 2019-04-14 DIAGNOSIS — C8233 Follicular lymphoma grade IIIa, intra-abdominal lymph nodes: Secondary | ICD-10-CM

## 2019-04-14 LAB — SURGICAL PATHOLOGY

## 2019-04-14 MED ORDER — DEXAMETHASONE 4 MG PO TABS: 8.0000 mg | ORAL_TABLET | Freq: Every day | ORAL | 1 refills | Status: DC

## 2019-04-14 MED ORDER — LORAZEPAM 0.5 MG PO TABS: 0.5000 mg | ORAL_TABLET | Freq: Four times a day (QID) | ORAL | 0 refills | Status: DC | PRN

## 2019-04-14 MED ORDER — ACYCLOVIR 400 MG PO TABS: 400.0000 mg | ORAL_TABLET | Freq: Every day | ORAL | 3 refills | Status: DC

## 2019-04-14 MED ORDER — PROCHLORPERAZINE MALEATE 10 MG PO TABS: 10.0000 mg | ORAL_TABLET | Freq: Four times a day (QID) | ORAL | 1 refills | Status: AC | PRN

## 2019-04-14 MED ORDER — ONDANSETRON HCL 8 MG PO TABS: 8.0000 mg | ORAL_TABLET | Freq: Two times a day (BID) | ORAL | 1 refills | Status: DC | PRN

## 2019-04-14 MED ORDER — LIDOCAINE-PRILOCAINE 2.5-2.5 % EX CREA: TOPICAL_CREAM | CUTANEOUS | 3 refills | Status: AC

## 2019-04-14 NOTE — Anesthesia Postprocedure Evaluation (Signed)
Anesthesia Post Note  Patient: Andrea Lloyd  Procedure(s) Performed: NECK MASS BIOPSY LEFT (Left )  Patient location during evaluation: PACU Anesthesia Type: General Level of consciousness: awake and alert Pain management: pain level controlled Vital Signs Assessment: post-procedure vital signs reviewed and stable Respiratory status: spontaneous breathing, nonlabored ventilation, respiratory function stable and patient connected to nasal cannula oxygen Cardiovascular status: blood pressure returned to baseline and stable Postop Assessment: no apparent nausea or vomiting Anesthetic complications: no     Last Vitals:  Vitals:   04/10/19 1451 04/10/19 1620  BP: 130/82 (!) 144/69  Pulse: 87 63  Resp: 12 16  Temp: (!) 36.2 C   SpO2: 96% 96%    Last Pain:  Vitals:   04/13/19 0835  TempSrc:   PainSc: 0-No pain                 Durenda Hurt

## 2019-04-14 NOTE — Progress Notes (Signed)
START ON PATHWAY REGIMEN - Lymphoma and CLL     A cycle is every 28 days:     Bendamustine      Rituximab-xxxx   **Always confirm dose/schedule in your pharmacy ordering system**  Patient Characteristics: Follicular Lymphoma, Grades 1, 2, and 3A, Grades 1, 2, and 3A, First Line, Stage III / IV, Symptomatic or Bulky Disease Disease Type: Follicular Lymphoma, Grade 1, 2, or 3A Disease Type: Not Applicable Disease Type: Not Applicable [place] Stage: III Line of Therapy: First Line Disease Characteristics: Symptomatic or Bulky Disease Intent of Therapy: Curative Intent, Discussed with Patient 

## 2019-04-16 ENCOUNTER — Other Ambulatory Visit: Payer: Self-pay

## 2019-04-17 ENCOUNTER — Inpatient Hospital Stay (HOSPITAL_BASED_OUTPATIENT_CLINIC_OR_DEPARTMENT_OTHER): Payer: BLUE CROSS/BLUE SHIELD | Admitting: Oncology

## 2019-04-17 ENCOUNTER — Encounter: Payer: Self-pay | Admitting: Oncology

## 2019-04-17 ENCOUNTER — Inpatient Hospital Stay: Payer: BLUE CROSS/BLUE SHIELD | Admitting: *Deleted

## 2019-04-17 ENCOUNTER — Other Ambulatory Visit: Payer: Self-pay

## 2019-04-17 ENCOUNTER — Other Ambulatory Visit: Payer: Self-pay | Admitting: Oncology

## 2019-04-17 ENCOUNTER — Inpatient Hospital Stay: Payer: BLUE CROSS/BLUE SHIELD

## 2019-04-17 VITALS — BP 128/90 | HR 91 | Temp 98.3°F | Resp 18 | Ht 65.0 in | Wt 186.4 lb

## 2019-04-17 VITALS — BP 111/73 | HR 106 | Temp 97.1°F | Resp 18

## 2019-04-17 DIAGNOSIS — C8238 Follicular lymphoma grade IIIa, lymph nodes of multiple sites: Secondary | ICD-10-CM | POA: Diagnosis present

## 2019-04-17 DIAGNOSIS — C8233 Follicular lymphoma grade IIIa, intra-abdominal lymph nodes: Secondary | ICD-10-CM

## 2019-04-17 DIAGNOSIS — Z5111 Encounter for antineoplastic chemotherapy: Secondary | ICD-10-CM | POA: Diagnosis not present

## 2019-04-17 DIAGNOSIS — Z7189 Other specified counseling: Secondary | ICD-10-CM

## 2019-04-17 DIAGNOSIS — Z5181 Encounter for therapeutic drug level monitoring: Secondary | ICD-10-CM

## 2019-04-17 DIAGNOSIS — Z5112 Encounter for antineoplastic immunotherapy: Secondary | ICD-10-CM | POA: Diagnosis present

## 2019-04-17 DIAGNOSIS — N1330 Unspecified hydronephrosis: Secondary | ICD-10-CM

## 2019-04-17 DIAGNOSIS — Z5189 Encounter for other specified aftercare: Secondary | ICD-10-CM | POA: Diagnosis not present

## 2019-04-17 DIAGNOSIS — R590 Localized enlarged lymph nodes: Secondary | ICD-10-CM

## 2019-04-17 DIAGNOSIS — Z90710 Acquired absence of both cervix and uterus: Secondary | ICD-10-CM

## 2019-04-17 DIAGNOSIS — Z95828 Presence of other vascular implants and grafts: Secondary | ICD-10-CM

## 2019-04-17 DIAGNOSIS — C8203 Follicular lymphoma grade I, intra-abdominal lymph nodes: Secondary | ICD-10-CM

## 2019-04-17 DIAGNOSIS — Z79620 Long term (current) use of immunosuppressive biologic: Secondary | ICD-10-CM

## 2019-04-17 DIAGNOSIS — M545 Low back pain: Secondary | ICD-10-CM | POA: Diagnosis not present

## 2019-04-17 LAB — COMPREHENSIVE METABOLIC PANEL
ALT: 50 U/L — ABNORMAL HIGH (ref 0–44)
AST: 33 U/L (ref 15–41)
Albumin: 4.3 g/dL (ref 3.5–5.0)
Alkaline Phosphatase: 108 U/L (ref 38–126)
Anion gap: 9 (ref 5–15)
BUN: 19 mg/dL (ref 8–23)
CO2: 25 mmol/L (ref 22–32)
Calcium: 9.3 mg/dL (ref 8.9–10.3)
Chloride: 105 mmol/L (ref 98–111)
Creatinine, Ser: 0.6 mg/dL (ref 0.44–1.00)
GFR calc Af Amer: >60 mL/min (ref >60)
GFR calc non Af Amer: >60 mL/min (ref >60)
Glucose, Bld: 94 mg/dL (ref 70–99)
Potassium: 3.5 mmol/L (ref 3.5–5.1)
Sodium: 139 mmol/L (ref 135–145)
Total Bilirubin: 0.6 mg/dL (ref 0.3–1.2)
Total Protein: 7.2 g/dL (ref 6.5–8.1)

## 2019-04-17 LAB — URIC ACID: Uric Acid, Serum: 2.4 mg/dL — ABNORMAL LOW (ref 2.5–7.1)

## 2019-04-17 LAB — CBC WITH DIFFERENTIAL/PLATELET
Abs Immature Granulocytes: 0.06 10*3/uL (ref 0.00–0.07)
Basophils Absolute: 0.1 10*3/uL (ref 0.0–0.1)
Basophils Relative: 1 %
Eosinophils Absolute: 0.2 10*3/uL (ref 0.0–0.5)
Eosinophils Relative: 1 %
HCT: 39.6 % (ref 36.0–46.0)
Hemoglobin: 12.7 g/dL (ref 12.0–15.0)
Immature Granulocytes: 1 %
Lymphocytes Relative: 30 %
Lymphs Abs: 3.4 10*3/uL (ref 0.7–4.0)
MCH: 27.9 pg (ref 26.0–34.0)
MCHC: 32.1 g/dL (ref 30.0–36.0)
MCV: 86.8 fL (ref 80.0–100.0)
Monocytes Absolute: 0.8 10*3/uL (ref 0.1–1.0)
Monocytes Relative: 7 %
Neutro Abs: 6.9 10*3/uL (ref 1.7–7.7)
Neutrophils Relative %: 60 %
Platelets: 278 10*3/uL (ref 150–400)
RBC: 4.56 MIL/uL (ref 3.87–5.11)
RDW: 14.2 % (ref 11.5–15.5)
WBC: 11.5 10*3/uL — ABNORMAL HIGH (ref 4.0–10.5)
nRBC: 0 % (ref 0.0–0.2)

## 2019-04-17 MED ORDER — DEXAMETHASONE SODIUM PHOSPHATE 10 MG/ML IJ SOLN
10.0000 mg | Freq: Once | INTRAMUSCULAR | Status: AC
  Administered 2019-04-17: 10:00:00 10 mg via INTRAVENOUS
  Filled 2019-04-17: qty 1

## 2019-04-17 MED ORDER — PALONOSETRON HCL INJECTION 0.25 MG/5ML
0.2500 mg | Freq: Once | INTRAVENOUS | Status: AC
  Administered 2019-04-17: 0.25 mg via INTRAVENOUS

## 2019-04-17 MED ORDER — DIPHENHYDRAMINE HCL 25 MG PO CAPS
50.0000 mg | ORAL_CAPSULE | Freq: Once | ORAL | Status: AC
  Administered 2019-04-17: 50 mg via ORAL
  Filled 2019-04-17: qty 2

## 2019-04-17 MED ORDER — SODIUM CHLORIDE 0.9 % IV SOLN
Freq: Once | INTRAVENOUS | Status: AC
  Administered 2019-04-17: 09:00:00 via INTRAVENOUS
  Filled 2019-04-17: qty 250

## 2019-04-17 MED ORDER — SODIUM CHLORIDE 0.9% FLUSH
10.0000 mL | Freq: Once | INTRAVENOUS | Status: AC
  Administered 2019-04-17: 08:00:00 10 mL via INTRAVENOUS
  Filled 2019-04-17: qty 10

## 2019-04-17 MED ORDER — SODIUM CHLORIDE 0.9 % IV SOLN
90.0000 mg/m2 | Freq: Once | INTRAVENOUS | Status: AC
  Administered 2019-04-17: 175 mg via INTRAVENOUS
  Filled 2019-04-17: qty 7

## 2019-04-17 MED ORDER — ACETAMINOPHEN 325 MG PO TABS
650.0000 mg | ORAL_TABLET | Freq: Once | ORAL | Status: AC
  Administered 2019-04-17: 10:00:00 650 mg via ORAL
  Filled 2019-04-17: qty 2

## 2019-04-17 MED ORDER — HEPARIN SOD (PORK) LOCK FLUSH 100 UNIT/ML IV SOLN
500.0000 [IU] | Freq: Once | INTRAVENOUS | Status: AC | PRN
  Administered 2019-04-17: 500 [IU]
  Filled 2019-04-17: qty 5

## 2019-04-17 MED ORDER — SODIUM CHLORIDE 0.9 % IV SOLN
375.0000 mg/m2 | Freq: Once | INTRAVENOUS | Status: AC
  Administered 2019-04-17: 700 mg via INTRAVENOUS
  Filled 2019-04-17: qty 20

## 2019-04-17 MED ORDER — SODIUM CHLORIDE 0.9 % IV SOLN: 10.0000 mg | Freq: Once | INTRAVENOUS | Status: DC

## 2019-04-17 NOTE — Progress Notes (Signed)
Pt to start new regimen for lymphoma. She has HA sometimes and was rec: to take tylenol and if it does not work then she has hydrocodone. I also spoke with her about the 3 nausea meds and when to take them and also only 1 at a time and she can rotate them every 6 hours if needed. Also the lorazepam can make her drowsy

## 2019-04-17 NOTE — Progress Notes (Signed)
Hematology/Oncology Consult note [facility]  [phone] [phone]  Patient Care Team: Patient, [name] as PCP - General (General Practice)   Name of the patient: Andrea Lloyd  [id number]  [birth year]   Date of visit: 04/17/19  Diagnosis-stage III grade 3A follicular lymphoma  Chief complaint/ Reason for visit-on treatment assessment prior to cycle 1 of bendamustine Rituxan chemotherapy  Heme/Onc history: patient is a 63 year old female who was seen by [facility] for evaluation of left lower quadrant abdominal pain which has been ongoing for the last few monthsShe has a history of hysterectomy/sacral colpopexy/ vaginal mesh in 2014.   2. This was followed by a CT of the abdomen which showed: Multiple enlarged abdominal and left common iliac nodes. Findings are worrisome for either metastatic adenopathy from a abdominal primary versus lymphoproliferative disorder. Consider further investigation with PET-CT and tissue sampling.  3. PET/CT scan from 01/08/2017 showed:Hypermetabolic upper abdominal and retroperitoneal lymph nodes, measuring up to 1.9 cm short axis, worrisome for nodal metastases versus lymphoma.  Hypermetabolic 7 mm short axis node at the left thoracic inlet, worrisome for lymphatic spread via the thoracic duct.  4. Tumor markers including CEA and CA-19-9 was normal at 1 and 8 respectively.  5. She is otherwise doing well and denies any complaints of unintentional weight loss, loss of appetite, fevers or chills or drenching night sweats. Her abdominal pain is mostly associated with eating and sometimes comes on a day later. It is mainly in her left lower quadrant and is a dull aching pain but sometimes she seems to have pain in the epigastrium and right upper quadrant  6. Patient underwent IR guided retroperitoneal LN biopsy which showed : DIAGNOSIS:  A. LYMPH NODE, LEFT RETROPERITONEAL; CT-GUIDED  BIOPSY:  - FOLLICULAR LYMPHOMA, GRADE 1-2.  7. Bone marrow biopsy negative for lymphoma. Normal karyotype and cytogenetics  8.  Patient underwent excisional lymph node biopsy of the left supraclavicular lymph node.   Interval history-patient has had night sweats for many years since she has undergone hysterectomy which has essentially remained unchanged.  She did take 3 days of steroids and feels that her left supraclavicular lymph node size is already decreasing.  She reports some fatigue.  Abdominal pain has improved.  Denies other complaints  ECOG PS- 0 Pain scale- 0 Opioid associated constipation- no  Review of systems- Review of Systems  Constitutional: Positive for malaise/fatigue. Negative for chills, fever and weight loss.  HENT: Negative for congestion, ear discharge and nosebleeds.   Eyes: Negative for blurred vision.  Respiratory: Negative for cough, hemoptysis, sputum production, shortness of breath and wheezing.   Cardiovascular: Negative for chest pain, palpitations, orthopnea and claudication.  Gastrointestinal: Negative for abdominal pain, blood in stool, constipation, diarrhea, heartburn, melena, nausea and vomiting.  Genitourinary: Negative for dysuria, flank pain, frequency, hematuria and urgency.  Musculoskeletal: Negative for back pain, joint pain and myalgias.  Skin: Negative for rash.  Neurological: Negative for dizziness, tingling, focal weakness, seizures, weakness and headaches.  Endo/Heme/Allergies: Does not bruise/bleed easily.  Psychiatric/Behavioral: Negative for depression and suicidal ideas. The patient does not have insomnia.        Allergies  Allergen Reactions   Adhesive [Tape] Rash    Bandaids     Past Medical History:  Diagnosis Date   Asthma    Family history of adverse reaction to anesthesia    Father - slow to wake   GERD (gastroesophageal reflux disease)    Gravida 4  para 4    Lymphoma ([place])      Past Surgical  History:  Procedure Laterality Date   ABDOMINAL HYSTERECTOMY     06 01 2013   BLADDER SURGERY     COLONOSCOPY WITH PROPOFOL N/A 02/25/2017   Procedure: COLONOSCOPY WITH PROPOFOL;  Surgeon: Lucilla Lame, MD;  Location: [facility];  Service: Endoscopy;  Laterality: N/A;   CYSTOSCOPY W/ URETERAL STENT PLACEMENT Left 03/28/2019   Procedure: CYSTOSCOPY WITH RETROGRADE PYELOGRAM/URETERAL STENT PLACEMENT;  Surgeon: Abbie Sons, MD;  Location: [facility];  Service: Urology;  Laterality: Left;   IR IMAGING GUIDED PORT INSERTION  04/06/2019   MASS BIOPSY Left 04/10/2019   Procedure: NECK MASS BIOPSY LEFT;  Surgeon: Jules Husbands, MD;  Location: [facility];  Service: General;  Laterality: Left;   POLYPECTOMY  02/25/2017   Procedure: POLYPECTOMY;  Surgeon: Lucilla Lame, MD;  Location: [place];  Service: Endoscopy;;   PORT A CATH INJECTION ([facility] HX)     ROTATOR CUFF REPAIR Right    TONSILLECTOMY  1972    Social History   Socioeconomic History   Marital status: Married    Spouse name: Not on file   Number of children: Not on file   Years of education: Not on file   Highest education level: Not on file  Occupational History   Not on file  Social Needs   Financial resource strain: Not on file   Food insecurity    Worry: Not on file    Inability: Not on file   Transportation needs    Medical: Not on file    Non-medical: Not on file  Tobacco Use   Smoking status: Never Smoker   Smokeless tobacco: Never Used  Substance and Sexual Activity   Alcohol use: No   Drug use: No   Sexual activity: Yes  Lifestyle   Physical activity    Days per week: Not on file    Minutes per session: Not on file   Stress: Not on file  Relationships   Social connections    Talks on phone: Not on file    Gets together: Not on file    Attends religious service: Not on file    Active member of club or organization: Not on file    Attends meetings of clubs or  organizations: Not on file    Relationship status: Not on file   Intimate partner violence    Fear of current or ex partner: Not on file    Emotionally abused: Not on file    Physically abused: Not on file    Forced sexual activity: Not on file  Other Topics Concern   Not on file  Social History Narrative   Not on file    Family History  Problem Relation Age of Onset   Alcohol abuse Maternal Grandfather    Healthy Mother    Cancer Father    Healthy Sister    Healthy Brother    Breast cancer Maternal Aunt    Hyperlipidemia Neg Hx    Heart disease Neg Hx    Diabetes Neg Hx      Current Outpatient Medications:    albuterol (PROVENTIL HFA) 108 (90 Base) MCG/ACT inhaler, Inhale 2 puffs into the lungs every 6 (six) hours as needed for wheezing or shortness of breath. , Disp: , Rfl:    allopurinol (ZYLOPRIM) 300 MG tablet, Take 1 tablet (300 mg total) by mouth 2 (two) times daily., Disp: 60 tablet,  Rfl: 2   HYDROcodone-acetaminophen (NORCO/VICODIN) 5-325 MG tablet, Take 1-2 tablets by mouth every 6 (six) hours as needed for moderate pain., Disp: 20 tablet, Rfl: 0   lidocaine-prilocaine (EMLA) cream, Apply to affected area once, Disp: 30 g, Rfl: 3   Menaquinone-7 (VITAMIN K2 PO), Take 200 mcg by mouth daily., Disp: , Rfl:    Multiple Vitamin (MULTI-VITAMIN DAILY PO), Take 1 tablet by mouth daily. , Disp: , Rfl:    predniSONE (DELTASONE) 50 MG tablet, Take 2 tablets (100 mg total) by mouth daily with breakfast., Disp: 10 tablet, Rfl: 0   acyclovir (ZOVIRAX) 400 MG tablet, Take 1 tablet (400 mg total) by mouth daily. (Patient not taking: Reported on 04/17/2019), Disp: 30 tablet, Rfl: 3   Cholecalciferol (VITAMIN D3 PO), Take 20,000 mcg by mouth daily., Disp: , Rfl:    dexamethasone (DECADRON) 4 MG tablet, Take 2 tablets (8 mg total) by mouth daily. Start the day after bendamustine chemotherapy for 2 days. Take with food. (Patient not taking: Reported on 04/17/2019),  Disp: 30 tablet, Rfl: 1   LORazepam (ATIVAN) 0.5 MG tablet, Take 1 tablet (0.5 mg total) by mouth every 6 (six) hours as needed (Nausea or vomiting). (Patient not taking: Reported on 04/17/2019), Disp: 30 tablet, Rfl: 0   ondansetron (ZOFRAN) 8 MG tablet, Take 1 tablet (8 mg total) by mouth 2 (two) times daily as needed for refractory nausea / vomiting. Start on day 2 after bendamustine chemotherapy. (Patient not taking: Reported on 04/17/2019), Disp: 30 tablet, Rfl: 1   oxybutynin (DITROPAN) 5 MG tablet, Take 1 tablet (5 mg total) by mouth every 8 (eight) hours as needed for bladder spasms. (Patient not taking: Reported on 04/17/2019), Disp: 30 tablet, Rfl: 0   prochlorperazine (COMPAZINE) 10 MG tablet, Take 1 tablet (10 mg total) by mouth every 6 (six) hours as needed (Nausea or vomiting). (Patient not taking: Reported on 04/17/2019), Disp: 30 tablet, Rfl: 1 No current facility-administered medications for this visit.   Facility-Administered Medications Ordered in Other Visits:    bendamustine (BENDEKA) 175 mg in sodium chloride 0.9 % 50 mL (3.0702 mg/mL) chemo infusion, 90 mg/m2 (Treatment Plan Recorded), Intravenous, Once, Sindy Guadeloupe, MD   heparin lock flush 100 unit/mL, 500 Units, Intracatheter, Once PRN, Sindy Guadeloupe, MD  Physical exam:  Vitals:   04/17/19 0847  BP: 128/90  Pulse: 91  Resp: 18  Temp: 98.3 F (36.8 C)  TempSrc: Tympanic  Weight: 186 lb 6.4 oz (84.6 kg)  Height: '5\' 5"'  (1.651 m)   Physical Exam Constitutional:      General: She is not in acute distress. HENT:     Head: Normocephalic and atraumatic.  Eyes:     Pupils: Pupils are equal, round, and reactive to light.  Neck:     Musculoskeletal: Normal range of motion.  Cardiovascular:     Rate and Rhythm: Normal rate and regular rhythm.     Heart sounds: Normal heart sounds.  Pulmonary:     Effort: Pulmonary effort is normal.     Breath sounds: Normal breath sounds.  Abdominal:     General: Bowel sounds  are normal.     Palpations: Abdomen is soft.  Lymphadenopathy:     Comments: There is palpable left supraclavicular adenopathy.  Scar of excisional biopsy is healing well.  Skin:    General: Skin is warm and dry.  Neurological:     Mental Status: She is alert and oriented to person, place, and time.  CMP Latest Ref Rng & Units 04/17/2019  Glucose 70 - 99 mg/dL 94  BUN 8 - 23 mg/dL 19  Creatinine 0.44 - 1.00 mg/dL 0.60  Sodium 135 - 145 mmol/L 139  Potassium 3.5 - 5.1 mmol/L 3.5  Chloride 98 - 111 mmol/L 105  CO2 22 - 32 mmol/L 25  Calcium 8.9 - 10.3 mg/dL 9.3  Total Protein 6.5 - 8.1 g/dL 7.2  Total Bilirubin 0.3 - 1.2 mg/dL 0.6  Alkaline Phos 38 - 126 U/L 108  AST 15 - 41 U/L 33  ALT 0 - 44 U/L 50(H)   CBC Latest Ref Rng & Units 04/17/2019  WBC 4.0 - 10.5 K/uL 11.5(H)  Hemoglobin 12.0 - 15.0 g/dL 12.7  Hematocrit 36.0 - 46.0 % 39.6  Platelets 150 - 400 K/uL 278    No images are attached to the encounter.  Dg Abd 1 View  Result Date: 03/28/2019 CLINICAL DATA:  Left ureteral stent placement. FLUOROSCOPY TIME:  42 seconds. Images: 2 EXAM: ABDOMEN - 1 VIEW COMPARISON:  CT scan Mar 28, 2019 FINDINGS: By the end of the study, a stent has been placed in the left ureter. Only the proximal aspect of the stent is identified. The proximal portion of the stent has not formed a complete pigtail. Hydronephrosis remains on the final image of the study. IMPRESSION: Left ureteral stent placement as above. Electronically Signed   By: Dorise Bullion III M.D   On: 03/28/2019 17:42   Ct Abdomen Pelvis W Contrast  Result Date: 03/28/2019 CLINICAL DATA:  Left lower quadrant abdominal pain and peritoneal signs on exam. Prior hysterectomy. History of follicular lymphoma. EXAM: CT ABDOMEN AND PELVIS WITH CONTRAST TECHNIQUE: Multidetector CT imaging of the abdomen and pelvis was performed using the standard protocol following bolus administration of intravenous contrast. CONTRAST:  178m OMNIPAQUE  IOHEXOL 300 MG/ML  SOLN COMPARISON:  12/31/2016 CT abdomen/pelvis and 01/08/2017 PET-CT. FINDINGS: Lower chest: Trace dependent left pleural effusion. New bilateral retrocrural adenopathy measuring up to 2.2 cm (series 2/image 23). Hepatobiliary: Normal liver size. No liver mass. Distended gallbladder. No gallbladder wall thickening. No radiopaque cholelithiasis. Minimal central intrahepatic biliary ductal dilatation, unchanged. CBD diameter 7 mm, slightly dilated, unchanged. No radiopaque choledocholithiasis. Pancreas: Normal, with no mass or duct dilation. Spleen: Normal size. No mass. Adrenals/Urinary Tract: Normal adrenals. New moderate left hydronephrosis with mild left perinephric edema. No right hydronephrosis. No renal masses. Normal bladder. Stomach/Bowel: Normal non-distended stomach. Normal caliber small bowel with no small bowel wall thickening. Normal appendix. Scattered mild colonic diverticulosis, with no large bowel wall thickening or significant pericolonic fat stranding. Vascular/Lymphatic: Atherosclerotic nonaneurysmal abdominal aorta. Patent portal, splenic, hepatic and renal veins. Massive confluent retroperitoneal adenopathy throughout pericaval, aortocaval and left periaortic chains, significantly increased from prior. Representative 7.3 cm short axis left para-aortic node (series 2/image 36), increased from 1.6 cm. Representative short axis 5.5 cm aortocaval node (series 2/image 32), increased from 2.1 cm. Reproductive: Status post hysterectomy, with no abnormal findings at the vaginal cuff. No adnexal mass. Other: No ascites. No pneumoperitoneum. Large subcutaneous 8.1 x 4.3 cm collection in the left gluteal region with thick wall (series 2/image 66), new from prior. Musculoskeletal: No aggressive appearing focal osseous lesions. Mild thoracolumbar spondylosis. IMPRESSION: 1. Marked confluent retroperitoneal and retrocaval adenopathy, substantially progressed since 12/31/2016 CT study,  compatible with progression of the patient's biopsy proven follicular lymphoma. 2. New moderate left hydronephrosis due to extrinsic obstruction from the progressive adenopathy. 3. Trace dependent left pleural effusion. 4. Mild colonic diverticulosis,  with no evidence of acute diverticulitis. No evidence of bowel obstruction. 5. Large subcutaneous collection in the left gluteal region with thick wall, nonspecific, potentially posttraumatic hematoma. Infected collection cannot be excluded. 6.  Aortic Atherosclerosis (ICD10-I70.0). Electronically Signed   By: Ilona Sorrel M.D.   On: 03/28/2019 10:40   Nm Pet Image Restag (ps) Skull Base To Thigh  Result Date: 04/02/2019 CLINICAL DATA:  Subsequent treatment strategy for lymphoma. EXAM: NUCLEAR MEDICINE PET SKULL BASE TO THIGH TECHNIQUE: 10.1 mCi F-18 FDG was injected intravenously. Full-ring PET imaging was performed from the skull base to thigh after the radiotracer. CT data was obtained and used for attenuation correction and anatomic localization. Fasting blood glucose: 95 mg/dl COMPARISON:  PET-CT 01/08/2017. and CT AP from 03/28/2019 FINDINGS: Mediastinal blood pool activity: SUV max 2.2 Liver activity: SUV max 3.7 NECK: Enlarged left supraclavicular lymph node measures 1.9 cm and has an SUV max of 17.05, Deauville criteria 5. Previously this measured 0.7 cm with an SUV max of 4.1. Incidental CT findings: none CHEST: No hypermetabolic mediastinal or hilar nodes. No suspicious pulmonary nodules on the CT scan. Incidental CT findings: Small left pleural effusion.  New. ABDOMEN/PELVIS: No abnormal uptake within the liver, pancreas, or spleen. The spleen measures 10.5 cm in length with SUV max similar to liver activity. Extensive, bulky retroperitoneal lymph nodes identified. Dominant retroperitoneal mass measures 12.3 by 8.7 by 12.3 cm and has an SUV max of 23.9, Deauville criteria 5. On previous exam the largest retroperitoneal lymph node had a short axis of 1.9  cm and an SUV max of 6.4. FDG avid enlarged retrocrural lymph nodes noted measuring 4.0 x 2.5 cm within SUV max of 18.5, Deauville criteria 5. previously this measured 0.9 cm within SUV max of 3.19. Incidental CT findings: Left-sided hydronephrosis is identified with newly placed ureteral stent. SKELETON: No focal hypermetabolic activity to suggest skeletal metastasis. Incidental CT findings: There is a mass within the left gluteal region which measures 7.5 by 6.1 cm. This is peripherally FDG avid suggesting underlying inflammatory or infectious process. IMPRESSION: 1. Examination compatible with recurrent progressive lymphoma, Deauville criteria 5. 2. Enlarged left supraclavicular, retrocrural and retroperitoneal adenopathy identified. Sites of disease demonstrate significant increase in size and degree of FDG uptake compared with previous. 3. Left-sided hydronephrosis status post ureteral stent placement 4. Persistent left gluteal region subcutaneous fluid collection with peripheral FDG uptake compatible with an inflammatory or infectious process. 5. Small left pleural effusion.  New. Electronically Signed   By: Kerby Moors M.D.   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 The patient now presents with significant progressive lymph node enlargement and PET scan evidence of progressive disease. Repeat lymph node biopsy now necessary to restage lymphoma. EXAM: CT GUIDED CORE BIOPSY OF LEFT PARA-AORTIC RETROPERITONEAL LYMPHADENOPATHY ANESTHESIA/SEDATION: 4.0 mg IV Versed; 100 mcg IV Fentanyl Total Moderate Sedation Time:  45 minutes. The patient's level of consciousness and physiologic status were continuously monitored during the procedure by Radiology nursing. Sedation represents total sedation administered for combined CT-guided bone marrow and lymph node  biopsy procedures. PROCEDURE: The procedure risks, benefits, and alternatives were explained to the patient. Questions regarding the procedure were encouraged and answered. The patient understands and consents to the procedure. A time-out was performed prior to initiating the procedure. CT was performed through the lower abdomen in a prone position.  The left translumbar region was prepped with chlorhexidine in a sterile fashion, and a sterile drape was applied covering the operative field. A sterile gown and sterile gloves were used for the procedure. Local anesthesia was provided with 1% Lidocaine. Under CT guidance, a 17 gauge needle was advanced from a left trans lumbar approach to the level of enlarged para-aortic lymphadenopathy. Coaxial 18 gauge core biopsy samples were obtained. A total of 6 core biopsy samples were submitted on saline soaked Telfa gauze. COMPLICATIONS: None FINDINGS: Largest area confluent lymphadenopathy measuring approximately 10 cm in maximum diameter was targeted in the left para-aortic region medial to a displaced ureteral stent. Solid tissue was obtained. IMPRESSION: CT-guided core biopsy performed of large lymph node mass in the left para-aortic retroperitoneum. Electronically Signed   By: Aletta Edouard M.D.   On: 04/03/2019 13:29   Ct Bone Marrow Biopsy & Aspiration  Result Date: 04/03/2019 CLINICAL DATA:  Follicular lymphoma with CT and PET scan evidence progressive lymph node disease and need for bone marrow biopsy for staging. EXAM: CT GUIDED BONE MARROW ASPIRATION AND BIOPSY ANESTHESIA/SEDATION: Versed 4.0 mg IV, Fentanyl 100 mcg IV Total Moderate Sedation Time:   45 minutes. The patient's level of consciousness and physiologic status were continuously monitored during the procedure by Radiology nursing. Sedation and sedation time reflect sedation given for combined bone marrow and lymph node biopsy procedures. PROCEDURE: The procedure risks, benefits, and alternatives were  explained to the patient. Questions regarding the procedure were encouraged and answered. The patient understands and consents to the procedure. A time out was performed prior to initiating the procedure. The right gluteal region was prepped with chlorhexidine. Sterile gown and sterile gloves were used for the procedure. Local anesthesia was provided with 1% Lidocaine. Under CT guidance, an 11 gauge On Control bone cutting needle was advanced from a posterior approach into the right iliac bone. Needle positioning was confirmed with CT. Initial non heparinized and heparinized aspirate samples were obtained of bone marrow. Core biopsy was performed via the On Control drill needle. COMPLICATIONS: None FINDINGS: Inspection of initial aspirate did reveal visible particles. Intact core biopsy sample was obtained. IMPRESSION: CT guided bone marrow biopsy of right posterior iliac bone with both aspirate and core samples obtained. Electronically Signed   By: Aletta Edouard M.D.   On: 04/03/2019 13:26   Dg C-arm 1-60 Min  Result Date: 03/28/2019 CLINICAL DATA:  Left ureteral stent placement. FLUOROSCOPY TIME:  42 seconds. Images: 2 EXAM: ABDOMEN - 1 VIEW COMPARISON:  CT scan Mar 28, 2019 FINDINGS: By the end of the study, a stent has been placed in the left ureter. Only the proximal aspect of the stent is identified. The proximal portion of the stent has not formed a complete pigtail. Hydronephrosis remains on the final image of the study. IMPRESSION: Left ureteral stent placement as above. Electronically Signed   By: Dorise Bullion III M.D   On: 03/28/2019 17:42   Ir Imaging Guided Port Insertion  Result Date: 04/06/2019 INDICATION: 63 year old female with a history of lymphoma EXAM: IMPLANTED PORT A CATH PLACEMENT WITH ULTRASOUND AND FLUOROSCOPIC GUIDANCE MEDICATIONS: 2 g Ancef; The antibiotic was administered within an appropriate time interval prior to skin puncture. ANESTHESIA/SEDATION: Moderate (conscious)  sedation was employed during this procedure. A total of Versed 2.0 mg and Fentanyl 100 mcg was administered intravenously. Moderate Sedation Time: 30 minutes. The patient's level of consciousness and vital signs were monitored continuously by radiology nursing throughout the procedure under my direct supervision.  FLUOROSCOPY TIME:  0 minutes, 6 seconds (2 mGy) COMPLICATIONS: None PROCEDURE: The procedure, risks, benefits, and alternatives were explained to the patient. Questions regarding the procedure were encouraged and answered. The patient understands and consents to the procedure. Ultrasound survey was performed with images stored and sent to PACs. The right neck and chest was prepped with chlorhexidine, and draped in the usual sterile fashion using maximum barrier technique (cap and mask, sterile gown, sterile gloves, large sterile sheet, hand hygiene and cutaneous antiseptic). Antibiotic prophylaxis was provided with 2.0g Ancef administered IV one hour prior to skin incision. Local anesthesia was attained by infiltration with 1% lidocaine without epinephrine. Ultrasound demonstrated patency of the right internal jugular vein, and this was documented with an image. Under real-time ultrasound guidance, this vein was accessed with a 21 gauge micropuncture needle and image documentation was performed. A small dermatotomy was made at the access site with an 11 scalpel. A 0.018" wire was advanced into the SVC and used to estimate the length of the internal catheter. The access needle exchanged for a 30F micropuncture vascular sheath. The 0.018" wire was then removed and a 0.035" wire advanced into the IVC. An appropriate location for the subcutaneous reservoir was selected below the clavicle and an incision was made through the skin and underlying soft tissues. The subcutaneous tissues were then dissected using a combination of blunt and sharp surgical technique and a pocket was formed. A single lumen power  injectable portacatheter was then tunneled through the subcutaneous tissues from the pocket to the dermatotomy and the port reservoir placed within the subcutaneous pocket. The venous access site was then serially dilated and a peel away vascular sheath placed over the wire. The wire was removed and the port catheter advanced into position under fluoroscopic guidance. The catheter tip is positioned in the cavoatrial junction. This was documented with a spot image. The portacatheter was then tested and found to flush and aspirate well. The port was flushed with saline followed by 100 units/mL heparinized saline. The pocket was then closed in two layers using first subdermal inverted interrupted absorbable sutures followed by a running subcuticular suture. The epidermis was then sealed with Dermabond. The dermatotomy at the venous access site was also seal with Dermabond. Patient tolerated the procedure well and remained hemodynamically stable throughout. No complications encountered and no significant blood loss encountered IMPRESSION: Status post right IJ port catheter placement. Catheter ready for use. Signed, Dulcy Fanny. Dellia Nims, RPVI [facility] Electronically Signed   By: Corrie Mckusick D.O.   On: 04/06/2019 14:08     Assessment and plan- Patient is a 63 y.o. 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  She is here for on treatment assessment prior to cycle 1 of bendamustine Rituxan chemotherapy  I have reviewed PET/CT scan images independently and discussed findings with the patient.  Also discussed the results of pathology and bone marrow biopsy with the patient in detail.  PET CT scan showed bulky retroperitoneal adenopathy as well as retrocrural and left supraclavicular adenopathy.  Bone marrow biopsy was  negative for lymphoma.  Patient underwent to biopsies.  First 1 was a core biopsy where a diffuse pattern could not be definitively ruled out given the limited amount of specimen.  She therefore underwent an excisional biopsy of the left supraclavicular lymph node which was reported as a grade 3A follicular  lymphoma.  There was no diffuse pattern seen classified as a grade 3B or no evidence of high-grade transformation to a diffuse large B-cell lymphoma.  Given that she has lymph node involvement on both sides of the diaphragm this would be a stage III disease.  I therefore recommend bendamustine and Rituxan chemoimmunotherapy starting today.  She will get bendamustine at 90 mg per metered square today and on 04/20/2019.  She will receive Rituxan today.  He does not have to take 5 days of steroids at this point.  I will also try to get on pro Neulasta approved for Monday.  Discussed risks and benefits of chemotherapy including all but not limited to nausea, vomiting, fatigue, risk of low blood counts and infections and hospitalization.  Risk of infusion reaction associated with Rituxan and bendamustine.  Treatment will be given with a palliative intent.  I will plan to get an interim PET CT scan after 3 cycles.  After 6 cycles of BR treatment I will decide about maintenance Rituxan at that time.  Baseline hepatitis panel has been checked and she is negative for hepatitis B and hepatitis C.  Patient will remain on Valtrex prophylaxis.  She also has PRN nausea medications.  Patient will continue allopurinol  I will tentatively see her back in 10 days time with repeat CBC with differential, CMP, LDH and uric acid to see how she has tolerated her treatment  Cancer Staging Follicular lymphoma of intra-abdominal lymph nodes ([place]) Staging form: Hodgkin and Non-Hodgkin Lymphoma, AJCC 8th Edition - Clinical stage from 06/04/3867: Stage III (Follicular lymphoma) - Signed by Sindy Guadeloupe, MD on 04/17/2019   Total  face to face encounter time for this patient visit was 40 min. >50% of the time was  spent in counseling and coordination of care.     Visit Diagnosis 1. Goals of care, counseling/discussion   2. Follicular lymphoma grade IIIa of intra-abdominal lymph nodes ([place])   3. Encounter for antineoplastic chemotherapy   4. Encounter for monitoring rituximab therapy      Dr. Randa Evens, MD, MPH [facility] [phone] 04/17/2019 10:14 AM

## 2019-04-17 NOTE — Addendum Note (Signed)
Addended by: Charlyn Minerva on: 04/17/2019 03:46 PM   Modules accepted: Orders

## 2019-04-17 NOTE — Progress Notes (Signed)
Pt tolerated infusion well. Pt and VS stable at discharge.  

## 2019-04-20 ENCOUNTER — Telehealth (INDEPENDENT_AMBULATORY_CARE_PROVIDER_SITE_OTHER): Payer: BLUE CROSS/BLUE SHIELD | Admitting: Urology

## 2019-04-20 ENCOUNTER — Inpatient Hospital Stay: Payer: BLUE CROSS/BLUE SHIELD

## 2019-04-20 ENCOUNTER — Other Ambulatory Visit: Payer: Self-pay

## 2019-04-20 VITALS — BP 105/70 | HR 84 | Temp 97.6°F | Resp 18

## 2019-04-20 DIAGNOSIS — N1339 Other hydronephrosis: Secondary | ICD-10-CM | POA: Diagnosis not present

## 2019-04-20 DIAGNOSIS — C8238 Follicular lymphoma grade IIIa, lymph nodes of multiple sites: Secondary | ICD-10-CM | POA: Diagnosis not present

## 2019-04-20 DIAGNOSIS — C8233 Follicular lymphoma grade IIIa, intra-abdominal lymph nodes: Secondary | ICD-10-CM

## 2019-04-20 DIAGNOSIS — N1330 Unspecified hydronephrosis: Secondary | ICD-10-CM

## 2019-04-20 MED ORDER — PALONOSETRON HCL INJECTION 0.25 MG/5ML
0.2500 mg | Freq: Once | INTRAVENOUS | Status: AC
  Administered 2019-04-20: 0.25 mg via INTRAVENOUS
  Filled 2019-04-20: qty 5

## 2019-04-20 MED ORDER — DEXAMETHASONE SODIUM PHOSPHATE 10 MG/ML IJ SOLN
10.0000 mg | Freq: Once | INTRAMUSCULAR | Status: AC
  Administered 2019-04-20: 11:00:00 10 mg via INTRAVENOUS
  Filled 2019-04-20: qty 1

## 2019-04-20 MED ORDER — SODIUM CHLORIDE 0.9 % IV SOLN
Freq: Once | INTRAVENOUS | Status: AC
  Administered 2019-04-20: 11:00:00 via INTRAVENOUS
  Filled 2019-04-20: qty 250

## 2019-04-20 MED ORDER — SODIUM CHLORIDE 0.9 % IV SOLN
90.0000 mg/m2 | Freq: Once | INTRAVENOUS | Status: AC
  Administered 2019-04-20: 175 mg via INTRAVENOUS
  Filled 2019-04-20: qty 7

## 2019-04-20 MED ORDER — HEPARIN SOD (PORK) LOCK FLUSH 100 UNIT/ML IV SOLN
500.0000 [IU] | Freq: Once | INTRAVENOUS | Status: AC | PRN
  Administered 2019-04-20: 500 [IU]
  Filled 2019-04-20: qty 5

## 2019-04-20 MED ORDER — SODIUM CHLORIDE 0.9 % IV SOLN: 10.0000 mg | Freq: Once | INTRAVENOUS | Status: DC

## 2019-04-21 ENCOUNTER — Inpatient Hospital Stay: Payer: BLUE CROSS/BLUE SHIELD

## 2019-04-21 ENCOUNTER — Other Ambulatory Visit: Payer: Self-pay

## 2019-04-21 DIAGNOSIS — C8233 Follicular lymphoma grade IIIa, intra-abdominal lymph nodes: Secondary | ICD-10-CM

## 2019-04-21 DIAGNOSIS — C8238 Follicular lymphoma grade IIIa, lymph nodes of multiple sites: Secondary | ICD-10-CM | POA: Diagnosis not present

## 2019-04-21 MED ORDER — PEGFILGRASTIM-CBQV 6 MG/0.6ML ~~LOC~~ SOSY
6.0000 mg | PREFILLED_SYRINGE | Freq: Once | SUBCUTANEOUS | Status: AC
  Administered 2019-04-21: 6 mg via SUBCUTANEOUS
  Filled 2019-04-21: qty 0.6

## 2019-04-21 NOTE — Progress Notes (Signed)
Virtual Visit via Video Note  I connected with Andrea Lloyd on 04/20/2019 at  2:30 PM EDT by a video enabled telemedicine application and verified that I am speaking with the correct person using two identifiers.  Location: Patient: Home Provider: Office   I discussed the limitations of evaluation and management by telemedicine and the availability of in person appointments. The patient expressed understanding and agreed to proceed.  History of Present Illness: 63 year old female with stage III grade 3A follicular lymphoma and bulky retroperitoneal adenopathy with left hydronephrosis.  She underwent left ureteral stent placement on 03/28/2019.  Overall she is tolerating her stent well and has intermittent frequency, urgency and left flank pain.  She did have CT-guided biopsy on 6/5 and there was a question of distal migration of the stent however was difficult to tell on the CT topogram.  She denies fever or chills.   Observations/Objective: Alert, NAD  Assessment and Plan: 63 year old female status post left ureteral stent placement for left hydronephrosis secondary to retroperitoneal adenopathy from follicular lymphoma.  Will obtain a KUB to better assess her stent position.  Follow Up Instructions:  I discussed the assessment and treatment plan with the patient. The patient was provided an opportunity to ask questions and all were answered. The patient agreed with the plan and demonstrated an understanding of the instructions.   The patient was advised to call back or seek an in-person evaluation if the symptoms worsen or if the condition fails to improve as anticipated.  I provided 10 minutes of non-face-to-face time during this encounter.   Abbie Sons, MD

## 2019-04-23 ENCOUNTER — Emergency Department: Payer: BLUE CROSS/BLUE SHIELD

## 2019-04-23 ENCOUNTER — Inpatient Hospital Stay: Payer: BLUE CROSS/BLUE SHIELD | Admitting: Anesthesiology

## 2019-04-23 ENCOUNTER — Other Ambulatory Visit: Payer: Self-pay

## 2019-04-23 ENCOUNTER — Inpatient Hospital Stay: Payer: BLUE CROSS/BLUE SHIELD

## 2019-04-23 ENCOUNTER — Encounter
Admission: EM | Disposition: A | Discharge status: Home / Self Care | Payer: Self-pay | Source: Home / Self Care | Attending: Nurse Practitioner

## 2019-04-23 ENCOUNTER — Inpatient Hospital Stay
Admission: EM | Admit: 2019-04-23 | Discharge: 2019-04-24 | DRG: 660 | Disposition: A | Discharge status: Home / Self Care | Payer: BLUE CROSS/BLUE SHIELD | Attending: Internal Medicine | Admitting: Internal Medicine

## 2019-04-23 ENCOUNTER — Encounter: Payer: Self-pay | Admitting: Emergency Medicine

## 2019-04-23 DIAGNOSIS — Z1159 Encounter for screening for other viral diseases: Secondary | ICD-10-CM

## 2019-04-23 DIAGNOSIS — N136 Pyonephrosis: Secondary | ICD-10-CM | POA: Diagnosis present

## 2019-04-23 DIAGNOSIS — K219 Gastro-esophageal reflux disease without esophagitis: Secondary | ICD-10-CM | POA: Diagnosis present

## 2019-04-23 DIAGNOSIS — M545 Low back pain: Secondary | ICD-10-CM

## 2019-04-23 DIAGNOSIS — D702 Other drug-induced agranulocytosis: Secondary | ICD-10-CM | POA: Diagnosis present

## 2019-04-23 DIAGNOSIS — Z90710 Acquired absence of both cervix and uterus: Secondary | ICD-10-CM | POA: Diagnosis not present

## 2019-04-23 DIAGNOSIS — D72829 Elevated white blood cell count, unspecified: Secondary | ICD-10-CM

## 2019-04-23 DIAGNOSIS — Z803 Family history of malignant neoplasm of breast: Secondary | ICD-10-CM

## 2019-04-23 DIAGNOSIS — Y929 Unspecified place or not applicable: Secondary | ICD-10-CM

## 2019-04-23 DIAGNOSIS — R7989 Other specified abnormal findings of blood chemistry: Secondary | ICD-10-CM

## 2019-04-23 DIAGNOSIS — T458X5A Adverse effect of other primarily systemic and hematological agents, initial encounter: Secondary | ICD-10-CM | POA: Diagnosis present

## 2019-04-23 DIAGNOSIS — M549 Dorsalgia, unspecified: Secondary | ICD-10-CM

## 2019-04-23 DIAGNOSIS — C8233 Follicular lymphoma grade IIIa, intra-abdominal lymph nodes: Secondary | ICD-10-CM | POA: Diagnosis present

## 2019-04-23 DIAGNOSIS — Z885 Allergy status to narcotic agent status: Secondary | ICD-10-CM

## 2019-04-23 DIAGNOSIS — Y732 Prosthetic and other implants, materials and accessory gastroenterology and urology devices associated with adverse incidents: Secondary | ICD-10-CM | POA: Diagnosis present

## 2019-04-23 DIAGNOSIS — J45909 Unspecified asthma, uncomplicated: Secondary | ICD-10-CM | POA: Diagnosis present

## 2019-04-23 DIAGNOSIS — Z79891 Long term (current) use of opiate analgesic: Secondary | ICD-10-CM | POA: Diagnosis not present

## 2019-04-23 DIAGNOSIS — Z79899 Other long term (current) drug therapy: Secondary | ICD-10-CM | POA: Diagnosis not present

## 2019-04-23 DIAGNOSIS — T83123A Displacement of other urinary stents, initial encounter: Secondary | ICD-10-CM | POA: Diagnosis present

## 2019-04-23 DIAGNOSIS — M5410 Radiculopathy, site unspecified: Secondary | ICD-10-CM | POA: Diagnosis present

## 2019-04-23 DIAGNOSIS — Z91048 Other nonmedicinal substance allergy status: Secondary | ICD-10-CM | POA: Diagnosis not present

## 2019-04-23 DIAGNOSIS — C8290 Follicular lymphoma, unspecified, unspecified site: Secondary | ICD-10-CM

## 2019-04-23 DIAGNOSIS — N1330 Unspecified hydronephrosis: Secondary | ICD-10-CM

## 2019-04-23 DIAGNOSIS — M5459 Other low back pain: Secondary | ICD-10-CM

## 2019-04-23 DIAGNOSIS — T83122D Displacement of urinary stent, subsequent encounter: Secondary | ICD-10-CM

## 2019-04-23 HISTORY — PX: CYSTOSCOPY WITH STENT PLACEMENT: SHX5790

## 2019-04-23 LAB — URINALYSIS, COMPLETE (UACMP) WITH MICROSCOPIC
Bacteria, UA: NONE SEEN
Bilirubin Urine: NEGATIVE
Glucose, UA: NEGATIVE mg/dL
Ketones, ur: NEGATIVE mg/dL
Nitrite: NEGATIVE
Protein, ur: NEGATIVE mg/dL
Specific Gravity, Urine: >1.046 — ABNORMAL HIGH (ref 1.005–1.030)
pH: 6 (ref 5.0–8.0)

## 2019-04-23 LAB — COMPREHENSIVE METABOLIC PANEL
ALT: 51 U/L — ABNORMAL HIGH (ref 0–44)
AST: 26 U/L (ref 15–41)
Albumin: 4.2 g/dL (ref 3.5–5.0)
Alkaline Phosphatase: 109 U/L (ref 38–126)
Anion gap: 10 (ref 5–15)
BUN: 21 mg/dL (ref 8–23)
CO2: 24 mmol/L (ref 22–32)
Calcium: 9.4 mg/dL (ref 8.9–10.3)
Chloride: 103 mmol/L (ref 98–111)
Creatinine, Ser: 0.76 mg/dL (ref 0.44–1.00)
GFR calc Af Amer: >60 mL/min (ref >60)
GFR calc non Af Amer: >60 mL/min (ref >60)
Glucose, Bld: 87 mg/dL (ref 70–99)
Potassium: 4.6 mmol/L (ref 3.5–5.1)
Sodium: 137 mmol/L (ref 135–145)
Total Bilirubin: 1.1 mg/dL (ref 0.3–1.2)
Total Protein: 6.9 g/dL (ref 6.5–8.1)

## 2019-04-23 LAB — CBC WITH DIFFERENTIAL/PLATELET
Abs Immature Granulocytes: 7.75 10*3/uL — ABNORMAL HIGH (ref 0.00–0.07)
Basophils Absolute: 0 10*3/uL (ref 0.0–0.1)
Basophils Relative: 0 %
Eosinophils Absolute: 0 10*3/uL (ref 0.0–0.5)
Eosinophils Relative: 0 %
HCT: 41.3 % (ref 36.0–46.0)
Hemoglobin: 13.5 g/dL (ref 12.0–15.0)
Immature Granulocytes: 13 %
Lymphocytes Relative: 3 %
Lymphs Abs: 2.1 10*3/uL (ref 0.7–4.0)
MCH: 28 pg (ref 26.0–34.0)
MCHC: 32.7 g/dL (ref 30.0–36.0)
MCV: 85.7 fL (ref 80.0–100.0)
Monocytes Absolute: 2.5 10*3/uL — ABNORMAL HIGH (ref 0.1–1.0)
Monocytes Relative: 4 %
Neutro Abs: 48.4 10*3/uL — ABNORMAL HIGH (ref 1.7–7.7)
Neutrophils Relative %: 80 %
Platelets: 330 10*3/uL (ref 150–400)
RBC: 4.82 MIL/uL (ref 3.87–5.11)
RDW: 15.2 % (ref 11.5–15.5)
WBC: 60.8 10*3/uL (ref 4.0–10.5)
nRBC: 0 % (ref 0.0–0.2)

## 2019-04-23 LAB — URIC ACID: Uric Acid, Serum: 3 mg/dL (ref 2.5–7.1)

## 2019-04-23 LAB — PROCALCITONIN: Procalcitonin: 0.24 ng/mL

## 2019-04-23 LAB — LACTIC ACID, PLASMA
Lactic Acid, Venous: 3.1 mmol/L (ref 0.5–1.9)
Lactic Acid, Venous: 2.1 mmol/L (ref 0.5–1.9)

## 2019-04-23 LAB — LIPASE, BLOOD: Lipase: 31 U/L (ref 11–51)

## 2019-04-23 LAB — TROPONIN I (HIGH SENSITIVITY)
Troponin I (High Sensitivity): 18 ng/L — ABNORMAL HIGH (ref <18)
Troponin I (High Sensitivity): 7 ng/L (ref <18)

## 2019-04-23 LAB — SARS CORONAVIRUS 2 BY RT PCR (HOSPITAL ORDER, PERFORMED IN ~~LOC~~ HOSPITAL LAB): SARS Coronavirus 2: NEGATIVE

## 2019-04-23 LAB — LACTATE DEHYDROGENASE: LDH: 224 U/L — ABNORMAL HIGH (ref 98–192)

## 2019-04-23 SURGERY — CYSTOSCOPY, WITH STENT INSERTION
Anesthesia: General | Laterality: Left

## 2019-04-23 MED ORDER — PROPOFOL 10 MG/ML IV BOLUS
INTRAVENOUS | Status: AC
  Filled 2019-04-23: qty 20

## 2019-04-23 MED ORDER — ALLOPURINOL 100 MG PO TABS
300.0000 mg | ORAL_TABLET | Freq: Two times a day (BID) | ORAL | Status: DC
  Administered 2019-04-23 – 2019-04-24 (×3): 300 mg via ORAL
  Filled 2019-04-23 (×2): qty 3
  Filled 2019-04-23 (×2): qty 1

## 2019-04-23 MED ORDER — IOPAMIDOL (ISOVUE-200) INJECTION 41%
INTRAVENOUS | Status: DC | PRN
  Administered 2019-04-23: 20 mL via INTRAVENOUS

## 2019-04-23 MED ORDER — SODIUM CHLORIDE 0.9 % IV SOLN
INTRAVENOUS | Status: DC
  Administered 2019-04-23 – 2019-04-24 (×3): via INTRAVENOUS

## 2019-04-23 MED ORDER — ALBUTEROL SULFATE (2.5 MG/3ML) 0.083% IN NEBU: 2.5000 mg | INHALATION_SOLUTION | Freq: Four times a day (QID) | RESPIRATORY_TRACT | Status: DC | PRN

## 2019-04-23 MED ORDER — FENTANYL CITRATE (PF) 100 MCG/2ML IJ SOLN
INTRAMUSCULAR | Status: DC | PRN
  Administered 2019-04-23: 25 ug via INTRAVENOUS
  Administered 2019-04-23: 50 ug via INTRAVENOUS
  Administered 2019-04-23: 25 ug via INTRAVENOUS

## 2019-04-23 MED ORDER — ONDANSETRON HCL 4 MG/2ML IJ SOLN
4.0000 mg | INTRAMUSCULAR | Status: AC
  Administered 2019-04-23: 4 mg via INTRAVENOUS
  Filled 2019-04-23: qty 2

## 2019-04-23 MED ORDER — VITAMIN K2 100 MCG PO TABS: 200.0000 ug | ORAL_TABLET | Freq: Every day | ORAL | Status: DC

## 2019-04-23 MED ORDER — DEXAMETHASONE SODIUM PHOSPHATE 10 MG/ML IJ SOLN
INTRAMUSCULAR | Status: DC | PRN
  Administered 2019-04-23: 10 mg via INTRAVENOUS

## 2019-04-23 MED ORDER — KETOROLAC TROMETHAMINE 15 MG/ML IJ SOLN
15.0000 mg | Freq: Four times a day (QID) | INTRAMUSCULAR | Status: DC | PRN
  Administered 2019-04-23 – 2019-04-24 (×3): 15 mg via INTRAVENOUS
  Filled 2019-04-23 (×6): qty 1

## 2019-04-23 MED ORDER — LIDOCAINE HCL (CARDIAC) PF 100 MG/5ML IV SOSY
PREFILLED_SYRINGE | INTRAVENOUS | Status: DC | PRN
  Administered 2019-04-23: 100 mg via INTRAVENOUS

## 2019-04-23 MED ORDER — ENOXAPARIN SODIUM 40 MG/0.4ML ~~LOC~~ SOLN
40.0000 mg | SUBCUTANEOUS | Status: DC
  Administered 2019-04-24: 40 mg via SUBCUTANEOUS
  Filled 2019-04-23: qty 0.4

## 2019-04-23 MED ORDER — PHENYLEPHRINE HCL (PRESSORS) 10 MG/ML IV SOLN
INTRAVENOUS | Status: DC | PRN
  Administered 2019-04-23 (×3): 100 ug via INTRAVENOUS

## 2019-04-23 MED ORDER — HYDROCODONE-ACETAMINOPHEN 5-325 MG PO TABS
1.0000 | ORAL_TABLET | Freq: Four times a day (QID) | ORAL | Status: DC | PRN
  Administered 2019-04-23 – 2019-04-24 (×3): 1 via ORAL
  Filled 2019-04-23: qty 1
  Filled 2019-04-23: qty 2

## 2019-04-23 MED ORDER — ALBUTEROL SULFATE HFA 108 (90 BASE) MCG/ACT IN AERS: 2.0000 | INHALATION_SPRAY | Freq: Four times a day (QID) | RESPIRATORY_TRACT | Status: DC | PRN

## 2019-04-23 MED ORDER — ACYCLOVIR 200 MG PO CAPS
400.0000 mg | ORAL_CAPSULE | Freq: Every day | ORAL | Status: DC
  Administered 2019-04-23 – 2019-04-24 (×2): 400 mg via ORAL
  Filled 2019-04-23 (×2): qty 2

## 2019-04-23 MED ORDER — HYDROMORPHONE HCL 1 MG/ML IJ SOLN
1.0000 mg | INTRAMUSCULAR | Status: AC
  Administered 2019-04-23: 1 mg via INTRAVENOUS
  Filled 2019-04-23: qty 1

## 2019-04-23 MED ORDER — SUCCINYLCHOLINE CHLORIDE 20 MG/ML IJ SOLN
INTRAMUSCULAR | Status: DC | PRN
  Administered 2019-04-23: 120 mg via INTRAVENOUS

## 2019-04-23 MED ORDER — LACTATED RINGERS IV SOLN
INTRAVENOUS | Status: DC | PRN
  Administered 2019-04-23: 13:00:00 via INTRAVENOUS

## 2019-04-23 MED ORDER — ADULT MULTIVITAMIN W/MINERALS CH
1.0000 | ORAL_TABLET | Freq: Every day | ORAL | Status: DC
  Administered 2019-04-23 – 2019-04-24 (×2): 1 via ORAL
  Filled 2019-04-23 (×2): qty 1

## 2019-04-23 MED ORDER — OXYBUTYNIN CHLORIDE 5 MG PO TABS
5.0000 mg | ORAL_TABLET | Freq: Three times a day (TID) | ORAL | Status: DC | PRN
  Administered 2019-04-23 – 2019-04-24 (×3): 5 mg via ORAL
  Filled 2019-04-23 (×3): qty 1

## 2019-04-23 MED ORDER — PROCHLORPERAZINE MALEATE 10 MG PO TABS
10.0000 mg | ORAL_TABLET | Freq: Four times a day (QID) | ORAL | Status: DC | PRN
  Filled 2019-04-23: qty 1

## 2019-04-23 MED ORDER — MIDAZOLAM HCL 2 MG/2ML IJ SOLN
INTRAMUSCULAR | Status: AC
  Filled 2019-04-23: qty 2

## 2019-04-23 MED ORDER — OXYBUTYNIN CHLORIDE 5 MG PO TABS
5.0000 mg | ORAL_TABLET | ORAL | Status: AC
  Administered 2019-04-23: 08:00:00 5 mg via ORAL
  Filled 2019-04-23: qty 1

## 2019-04-23 MED ORDER — ROCURONIUM BROMIDE 100 MG/10ML IV SOLN
INTRAVENOUS | Status: DC | PRN
  Administered 2019-04-23: 10 mg via INTRAVENOUS
  Administered 2019-04-23: 40 mg via INTRAVENOUS

## 2019-04-23 MED ORDER — ONDANSETRON HCL 4 MG/2ML IJ SOLN
INTRAMUSCULAR | Status: DC | PRN
  Administered 2019-04-23: 4 mg via INTRAVENOUS

## 2019-04-23 MED ORDER — MORPHINE SULFATE (PF) 4 MG/ML IV SOLN: 2.0000 mg | INTRAVENOUS | Status: DC | PRN

## 2019-04-23 MED ORDER — SODIUM CHLORIDE 0.9 % IV BOLUS
1000.0000 mL | Freq: Once | INTRAVENOUS | Status: AC
  Administered 2019-04-23: 06:00:00 1000 mL via INTRAVENOUS

## 2019-04-23 MED ORDER — SODIUM CHLORIDE 0.9 % IV SOLN
1.0000 g | INTRAVENOUS | Status: DC
  Administered 2019-04-23 – 2019-04-24 (×2): 1 g via INTRAVENOUS
  Filled 2019-04-23 (×2): qty 1

## 2019-04-23 MED ORDER — KETOROLAC TROMETHAMINE 30 MG/ML IJ SOLN
15.0000 mg | Freq: Once | INTRAMUSCULAR | Status: AC
  Administered 2019-04-23: 08:00:00 15 mg via INTRAVENOUS
  Filled 2019-04-23: qty 1

## 2019-04-23 MED ORDER — POLYETHYLENE GLYCOL 3350 17 G PO PACK
17.0000 g | PACK | Freq: Every day | ORAL | Status: DC
  Administered 2019-04-23 – 2019-04-24 (×2): 17 g via ORAL
  Filled 2019-04-23 (×2): qty 1

## 2019-04-23 MED ORDER — PROPOFOL 10 MG/ML IV BOLUS
INTRAVENOUS | Status: DC | PRN
  Administered 2019-04-23: 160 mg via INTRAVENOUS

## 2019-04-23 MED ORDER — FENTANYL CITRATE (PF) 100 MCG/2ML IJ SOLN
INTRAMUSCULAR | Status: AC
  Filled 2019-04-23: qty 2

## 2019-04-23 MED ORDER — PROMETHAZINE HCL 25 MG/ML IJ SOLN: 6.2500 mg | INTRAMUSCULAR | Status: DC | PRN

## 2019-04-23 MED ORDER — HYDROCODONE-ACETAMINOPHEN 5-325 MG PO TABS
ORAL_TABLET | ORAL | Status: AC
  Filled 2019-04-23: qty 1

## 2019-04-23 MED ORDER — LIDOCAINE-PRILOCAINE 2.5-2.5 % EX CREA
TOPICAL_CREAM | Freq: Once | CUTANEOUS | Status: DC
  Filled 2019-04-23: qty 5

## 2019-04-23 MED ORDER — IOPAMIDOL (ISOVUE-370) INJECTION 76%
125.0000 mL | Freq: Once | INTRAVENOUS | Status: AC | PRN
  Administered 2019-04-23: 125 mL via INTRAVENOUS

## 2019-04-23 MED ORDER — LORAZEPAM 0.5 MG PO TABS: 0.5000 mg | ORAL_TABLET | Freq: Four times a day (QID) | ORAL | Status: DC | PRN

## 2019-04-23 SURGICAL SUPPLY — 18 items
BAG DRAIN CYSTO-URO LG1000N (MISCELLANEOUS) ×3 IMPLANT
BRUSH SCRUB EZ  4% CHG (MISCELLANEOUS) ×2
BRUSH SCRUB EZ 4% CHG (MISCELLANEOUS) IMPLANT
CATH URETL 5X70 OPEN END (CATHETERS) ×3 IMPLANT
GLOVE BIO SURGEON STRL SZ8 (GLOVE) ×3 IMPLANT
GOWN STRL REUS W/ TWL XL LVL3 (GOWN DISPOSABLE) ×1 IMPLANT
GOWN STRL REUS W/TWL XL LVL3 (GOWN DISPOSABLE) ×2
GUIDEWIRE STR DUAL SENSOR (WIRE) ×3 IMPLANT
KIT TURNOVER CYSTO (KITS) ×3 IMPLANT
PACK CYSTO AR (MISCELLANEOUS) ×3 IMPLANT
SET CYSTO W/LG BORE CLAMP LF (SET/KITS/TRAYS/PACK) ×3 IMPLANT
SOL .9 NS 3000ML IRR  AL (IV SOLUTION) ×2
SOL .9 NS 3000ML IRR UROMATIC (IV SOLUTION) ×1 IMPLANT
STENT URET 6FRX24 CONTOUR (STENTS) IMPLANT
STENT URET 6FRX26 CONTOUR (STENTS) IMPLANT
STENT URET 6FRX28 CONTOUR (STENTS) ×2 IMPLANT
SURGILUBE 2OZ TUBE FLIPTOP (MISCELLANEOUS) ×3 IMPLANT
WATER STERILE IRR 1000ML POUR (IV SOLUTION) ×3 IMPLANT

## 2019-04-23 NOTE — ED Notes (Signed)
Pt to CT

## 2019-04-23 NOTE — ED Notes (Signed)
Pt up to toilet in room, no assistance needed, pt steady on feet.

## 2019-04-23 NOTE — ED Notes (Signed)
Admitting MD at bedside.

## 2019-04-23 NOTE — ED Notes (Signed)
Dr. Karma Greaser at the bedside to discuss results and plan of care.

## 2019-04-23 NOTE — ED Triage Notes (Addendum)
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

## 2019-04-23 NOTE — ED Provider Notes (Signed)
[facility] Emergency Department Provider Note  ____________________________________________   First MD Initiated Contact with Patient 04/23/19 [phone]     (approximate)  I have reviewed the triage vital signs and the nursing notes.   HISTORY  Chief Complaint Severe back pain and spasms.  Level 5 caveat:  history/ROS limited by acute/critical illness  HPI Andrea Lloyd is a 63 y.o. female  with stage III grade 3A follicular lymphoma and bulky retroperitoneal adenopathy with left hydronephrosis  status post left ureteral stent placement on 03/28/2019 by Dr. Bernardo Heater.  There was a question of distal migration of the stent on a CT-guided biopsy on 04/03/2019 but Dr. Bernardo Heater saw the patient in a telemedicine visit about 3 days ago and the plan was to continue with the stent the way it is going because she was not having any issues.  She presents with acute onset and severe pain starting at about 8 PM that is gotten steadily worse since the onset.  She currently can barely speak due to frequent spasms of pain with a constant level of pain that is all throughout her back and radiates into her chest, up to the left side of her neck, and nothing in particular makes it better or worse.  She is breathing rapidly and grimacing and crying out in pain when a spasm of pain hits her.  She reported to the triage nurse before I started that she was also having severe indigestion earlier today which is unusual for her.  She is currently undergoing chemotherapy for the lymphoma and had an injection about 2 days ago to increase her white blood cell count (her oncologist is Dr. Janese Banks).  She is concerned about the possibility that her ureteral stent could have moved but she has never had any pain like this before.  She has no history of aortic disease of which she is aware.  No prior history of heart issues.        Past Medical History:  Diagnosis Date   Asthma    Family history of  adverse reaction to anesthesia    Father - slow to wake   GERD (gastroesophageal reflux disease)    Gravida 4 para 4    Lymphoma ([place])     Patient Active Problem List   Diagnosis Date Noted   Hydronephrosis 03/29/2019   Hydronephrosis of left kidney 03/28/2019   Abdominal mass    Ureteral obstruction, left    Hyperuricemia    Follicular lymphoma of intra-abdominal lymph nodes (HCC)    Colon cancer screening    Benign neoplasm of transverse colon    Polyp of sigmoid colon    Follicular lymphoma grade I of intra-abdominal lymph nodes ([place]) 01/28/2017   Abnormal computed tomography angiography (CTA) of abdomen and pelvis 01/25/2017   Airway hyperreactivity 04/06/2015   Disorder of rotator cuff 04/20/2014    Past Surgical History:  Procedure Laterality Date   ABDOMINAL HYSTERECTOMY     06 01 2013   BLADDER SURGERY     COLONOSCOPY WITH PROPOFOL N/A 02/25/2017   Procedure: COLONOSCOPY WITH PROPOFOL;  Surgeon: Lucilla Lame, MD;  Location: [place];  Service: Endoscopy;  Laterality: N/A;   CYSTOSCOPY W/ URETERAL STENT PLACEMENT Left 03/28/2019   Procedure: CYSTOSCOPY WITH RETROGRADE PYELOGRAM/URETERAL STENT PLACEMENT;  Surgeon: Abbie Sons, MD;  Location: [facility];  Service: Urology;  Laterality: Left;   IR IMAGING GUIDED PORT INSERTION  04/06/2019   MASS BIOPSY Left 04/10/2019   Procedure: NECK  MASS BIOPSY LEFT;  Surgeon: Jules Husbands, MD;  Location: [facility];  Service: General;  Laterality: Left;   POLYPECTOMY  02/25/2017   Procedure: POLYPECTOMY;  Surgeon: Lucilla Lame, MD;  Location: [place];  Service: Endoscopy;;   PORT A CATH INJECTION ([place] HX)     ROTATOR CUFF REPAIR Right    TONSILLECTOMY  1972    Prior to Admission medications   Medication Sig Start Date End Date Taking? Authorizing Provider  acyclovir (ZOVIRAX) 400 MG tablet Take 1 tablet (400 mg total) by mouth daily. 04/14/19  Yes Sindy Guadeloupe, MD  albuterol  (PROVENTIL HFA) 108 (90 Base) MCG/ACT inhaler Inhale 2 puffs into the lungs every 6 (six) hours as needed for wheezing or shortness of breath.  11/19/17  Yes [provider]  allopurinol (ZYLOPRIM) 300 MG tablet Take 1 tablet (300 mg total) by mouth 2 (two) times daily. 04/03/19  Yes Sindy Guadeloupe, MD  Cholecalciferol (VITAMIN D3 PO) Take 20,000 mcg by mouth daily.   Yes [provider]  HYDROcodone-acetaminophen (NORCO/VICODIN) 5-325 MG tablet Take 1-2 tablets by mouth every 6 (six) hours as needed for moderate pain. 04/10/19  Yes Pabon, Diego F, MD  lidocaine-prilocaine (EMLA) cream Apply to affected area once 04/14/19  Yes Sindy Guadeloupe, MD  LORazepam (ATIVAN) 0.5 MG tablet Take 1 tablet (0.5 mg total) by mouth every 6 (six) hours as needed (Nausea or vomiting). 04/14/19  Yes Sindy Guadeloupe, MD  Menaquinone-7 (VITAMIN K2 PO) Take 200 mcg by mouth daily.   Yes [provider]  Multiple Vitamin (MULTI-VITAMIN DAILY PO) Take 1 tablet by mouth daily.    Yes [provider]  ondansetron (ZOFRAN) 8 MG tablet Take 1 tablet (8 mg total) by mouth 2 (two) times daily as needed for refractory nausea / vomiting. Start on day 2 after bendamustine chemotherapy. 04/14/19  Yes Sindy Guadeloupe, MD  oxybutynin (DITROPAN) 5 MG tablet Take 1 tablet (5 mg total) by mouth every 8 (eight) hours as needed for bladder spasms. 03/29/19  Yes Sainani, Belia Heman, MD  polyethylene glycol (MIRALAX / GLYCOLAX) 17 g packet Take 17 g by mouth daily.   Yes [provider]  Probiotic Product (PROBIOTIC PO) Take 1 capsule by mouth daily.   Yes [provider]  prochlorperazine (COMPAZINE) 10 MG tablet Take 1 tablet (10 mg total) by mouth every 6 (six) hours as needed (Nausea or vomiting). 04/14/19  Yes Sindy Guadeloupe, MD  dexamethasone (DECADRON) 4 MG tablet Take 2 tablets (8 mg total) by mouth daily. Start the day after bendamustine chemotherapy for 2 days. Take with food. Patient not  taking: Reported on 04/17/2019 04/14/19   Sindy Guadeloupe, MD  predniSONE (DELTASONE) 50 MG tablet Take 2 tablets (100 mg total) by mouth daily with breakfast. Patient not taking: Reported on 04/23/2019 04/07/19   Sindy Guadeloupe, MD    Allergies Adhesive [tape]  Family History  Problem Relation Age of Onset   Alcohol abuse Maternal Grandfather    Healthy Mother    Cancer Father    Healthy Sister    Healthy Brother    Breast cancer Maternal Aunt    Hyperlipidemia Neg Hx    Heart disease Neg Hx    Diabetes Neg Hx     Social History Social History   Tobacco Use   Smoking status: Never Smoker   Smokeless tobacco: Never Used  Substance Use Topics   Alcohol use: No   Drug  use: No    Review of Systems Level 5 caveat:  history/ROS limited by acute/critical illness   Constitutional: No fever/chills Eyes: No visual changes. ENT: No sore throat. Cardiovascular: chest pain that seems to be radiating from the back and into the left side of the neck Respiratory: Denies shortness of breath. Gastrointestinal: +abdominal pain.  Indigestion symptoms today.  No nausea, no vomiting.  No diarrhea.  No constipation. Genitourinary: Negative for dysuria. Musculoskeletal: Acute onset and severe lower back pain that radiates throughout her body as described above.  Some pain that seems to be rating from the back and chest up into the left side of the neck.   Integumentary: Negative for rash. Neurological: Negative for headaches, focal weakness or numbness.   ____________________________________________   PHYSICAL EXAM:  VITAL SIGNS: ED Triage Vitals [04/23/19 0353]  Enc Vitals Group     BP (!) 130/107     Pulse Rate (!) 110     Resp (!) 30     Temp 97.8 F (36.6 C)     Temp Source Oral     SpO2 95 %     Weight      Height      Head Circumference      Peak Flow      Pain Score      Pain Loc      Pain Edu?      Excl. in [place]?     Constitutional: Alert and oriented.   Severe distress with spasms of pain, diaphoretic, moaning and crying out frequently. Eyes: Conjunctivae are normal.  Head: Atraumatic. Nose: No congestion/rhinnorhea. Mouth/Throat: Mucous membranes are moist. Neck: No stridor.  No meningeal signs.   Cardiovascular: Normal rate, regular rhythm. Good peripheral circulation. Grossly normal heart sounds. Respiratory: Normal respiratory effort.  No retractions. No audible wheezing. Gastrointestinal: Soft and nontender. No distention.  Musculoskeletal: No obvious gross deformity of the back.  No specific tenderness to palpation, the pain seems to be internal and not reproducible on the outside.  Some tenderness to percussion but difficult to appreciate if this is due to the precaution or due to the spasms or internal pain. Neurologic: Speech limited due to severe pain. No gross focal neurologic deficits are appreciated.  Skin:  Skin is warm, mildly diaphoretic and intact. No rash noted.   ____________________________________________   LABS (all labs ordered are listed, but only abnormal results are displayed)  Labs Reviewed  CBC WITH DIFFERENTIAL/PLATELET - Abnormal; Notable for the following components:      Result Value   WBC 60.8 (*)    Neutro Abs 48.4 (*)    Monocytes Absolute 2.5 (*)    Abs Immature Granulocytes 7.75 (*)    All other components within normal limits  COMPREHENSIVE METABOLIC PANEL - Abnormal; Notable for the following components:   ALT 51 (*)    All other components within normal limits  LACTIC ACID, PLASMA - Abnormal; Notable for the following components:   Lactic Acid, Venous 3.1 (*)    All other components within normal limits  TROPONIN I (HIGH SENSITIVITY) - Abnormal; Notable for the following components:   Troponin I (High Sensitivity) 18 (*)    All other components within normal limits  SARS CORONAVIRUS 2 (HOSPITAL ORDER, [place] [facility])  URINE CULTURE  LIPASE, BLOOD  PROCALCITONIN    URINALYSIS, COMPLETE (UACMP) WITH MICROSCOPIC  LACTIC ACID, PLASMA  TROPONIN I (HIGH SENSITIVITY)   ____________________________________________  EKG  ED ECG REPORT I, Tommi Rumps  Karma Greaser, the attending physician, personally viewed and interpreted this ECG.  Date: 04/23/2019 EKG Time: 4:04 Rate: 90 Rhythm: normal sinus rhythm with heavy artifact QRS Axis: normal Intervals: normal ST/T Wave abnormalities: Non-specific ST segment / T-wave changes, but no clear evidence of acute ischemia. Narrative Interpretation: no definitive evidence of acute ischemia; does not meet STEMI criteria.   ____________________________________________  RADIOLOGY  ED MD interpretation: Improvement in response to chemotherapy, probable displacement of left ureteral stent is seen is chronic, no acute vascular abnormality.  Official radiology report(s): Ct Angio Chest/abd/pel For Dissection W And/or W/wo  Result Date: 04/23/2019 CLINICAL DATA:  Lower back pain.  Chest pain EXAM: CT ANGIOGRAPHY CHEST, ABDOMEN AND PELVIS TECHNIQUE: Multidetector CT imaging through the chest, abdomen and pelvis was performed using the standard protocol during bolus administration of intravenous contrast. Multiplanar reconstructed images and MIPs were obtained and reviewed to evaluate the vascular anatomy. CONTRAST:  128mL ISOVUE-370 IOPAMIDOL (ISOVUE-370) INJECTION 76% COMPARISON:  PET CT 04/02/2019 FINDINGS: CTA CHEST FINDINGS Cardiovascular: No intramural hematoma the thoracic aorta on noncontrast phase. Negative for aortic dissection or aneurysm. No noted atheromatous changes. No pulmonary artery filling defect. Mediastinum/Nodes: Negative for thoracic adenopathy or mass Lungs/Pleura: There is no edema, consolidation, effusion, or pneumothorax. Musculoskeletal: No acute or aggressive finding Review of the MIP images confirms the above findings. CTA ABDOMEN AND PELVIS FINDINGS VASCULAR Aorta: Minimal atheromatous plaque.  No dissection  or aneurysm. Celiac: Vessels are smooth and widely patent SMA: Vessels are smooth and widely patent Renals: Accessory left renal artery. Vessels are smooth and widely patent IMA: Patent Inflow: Widely patent Veins: Negative in the arterial phase Review of the MIP images confirms the above findings. NON-VASCULAR Hepatobiliary: Rounded hypervascular foci in inferior central liver and segment 3 not seen on most recent portal venous and delayed phase abdominal CT, possibly shunts. These measure up to 16 mm. Patient is under surveillance for lymphoma.No evidence of biliary obstruction or stone. Pancreas: Unremarkable. Spleen: Unremarkable. Adrenals/Urinary Tract: Negative adrenals. Essentially resolved left hydronephrosis. Left ureteral stent with retention loop loosely formed at the level of the upper ureter, alignment similar to 04/03/2019 scout CT. Unremarkable bladder. Stomach/Bowel: Moderate stool volume without colonic over distension. No bowel wall thickening Lymphatic: Retro crural and retroperitoneal lymphadenopathy which has decreased in bulk since 04/28/2019. nodes remain homogeneous. Reproductive:Hysterectomy Other: No ascites or pneumoperitoneum. Musculoskeletal: No acute or aggressive finding. Chronic rim enhancing fluid collection in the left subcutaneous gluteal region. Review of the MIP images confirms the above findings. IMPRESSION: 1. No evidence of acute aortic syndrome. No significant atheromatous changes or stenosis. 2. Lymphoma with interval positive treatment response. 3. Left ureteral stent with loosely formed upper retention loop at the level of the upper ureter, unchanged. 4. 2 hypervascular foci in the liver not seen on most recent PET-CT and abdominal CT, possible shunts. Attention on follow-up. Electronically Signed   By: Monte Fantasia M.D.   On: 04/23/2019 05:03    ____________________________________________   PROCEDURES   Procedure(s) performed (including Critical  Care):  Procedures   ____________________________________________   INITIAL IMPRESSION / MDM / [place] / ED COURSE  As part of my medical decision making, I reviewed the following data within the [place] notes reviewed and incorporated, Labs reviewed , Discussed with admitting physician (Dr. Marcille Blanco), discussed with urology (Dr. Bernardo Heater), Notes from prior ED visits and [place] Controlled Substance Database      *Andrea Lloyd was evaluated in Emergency Department on 04/23/2019 for the  symptoms described in the history of present illness. She was evaluated in the context of the global COVID-19 pandemic, which necessitated consideration that the patient might be at risk for infection with the SARS-CoV-2 virus that causes COVID-19. Institutional protocols and algorithms that pertain to the evaluation of patients at risk for COVID-19 are in a state of rapid change based on information released by regulatory bodies including the CDC and federal and state organizations. These policies and algorithms were followed during the patient's care in the ED.  Some ED evaluations and interventions may be delayed as a result of limited staffing during the pandemic.*  Differential diagnosis includes, but is not limited to, ureteral stent malfunction, UTI/pyelonephritis, worsening tumor burden, aortic dissection, AAA, ACS, PE, musculoskeletal spasms, disc herniation or compressed nerve in the back.  The patient is in severe distress and I am ordering Dilaudid 1 mg IV.  Given her constellation of symptoms including the acute onset and severe back pain that radiates in the abdomen, chest, and up into the neck with recent indigestion, I am going to obtain an emergent CTA chest/abdomen/pelvis to rule out dissection.  The patient has no history of renal dysfunction which I noted when I reviewed her medical record.  She is also getting Zofran 4 mg IV.  I will scan her soon as possible  and I am also checking basic labs including urinalysis, procalcitonin, and lactic acid.  Clinical Course as of Apr 22 733  Thu Apr 23, 2019  0416 I discussed the case in person with [facility] [place] the CT technologist and explained that I did not want to wait for lab work given that the patient has no history of renal dysfunction and that I feel that the possibility of aortic emergency necessitates not waiting for the metabolic panel results.  If the patient's pain is adequately controlled with the Dilaudid we will try to scan now.   [CF]  [phone] The patient's work-up has been generally reassuring.  Given negative, procalcitonin 0.24, lipase 31, essentially normal comprehensive metabolic panel.  The CBC demonstrates a significant leukocytosis of 60.8 but she has lymphoma and has been getting treatments.  Improve her white blood cell count I think this is an artificial leukocytosis and not indicative of acute infection.  Her lactic acid of 3.1 is almost certainly due to the severe distress that she was in for several hours prior to coming to the ED.  She currently has well-controlled pain is much better than prior but she still has episodes that appear to be spasms either of her back or her ureter.I reviewed the results of her CT scans and her labs with her and explained there is no evidence of an acute or emergent medical condition at this time.  She was very surprised but reassured to know that her chemotherapy seems to be working and the lymphadenopathy burden has improved.  She is still very concerned about the spasms she is having and says that she has been using oxybutynin only about once a day.  I will give her a dose of Toradol 15 mg IV and oxybutynin 5 mg p.o.  I would seem to contact Dr. Bernardo Heater to discuss the case but this point it does not seem to be an indication for admission.  She understands but is still concerned.  Her troponin was slightly elevated which is almost certainly due to demand ischemia and I  will repeated as well as checking a repeat lactic acid to make sure it is trending downward.   [  CF]  0725 I discussed the case with Dr. Bernardo Heater by phone.  He agreed with the plan for Toradol and oxybutynin but also said that he would consult to replace the stent which could be causing the spasms and pain.  I discussed the case in person with Dr. Marcille Blanco with the hospitalist service who will admit for pain control, IV fluids, keep the patient n.p.o., consult urology, and possibly consult oncology (Dr. Janese Banks).  I updated the patient as well and she agrees with the plan.   [CF]    Clinical Course User Index [CF] Hinda Kehr, MD     ____________________________________________  FINAL CLINICAL IMPRESSION(S) / ED DIAGNOSES  Final diagnoses:  Intractable low back pain  Ureteral stent displacement, subsequent encounter  Elevated lactic acid level  Leukocytosis, unspecified type     MEDICATIONS GIVEN DURING THIS VISIT:  Medications  ketorolac (TORADOL) 30 MG/ML injection 15 mg (has no administration in time range)  oxybutynin (DITROPAN) tablet 5 mg (has no administration in time range)  HYDROmorphone (DILAUDID) injection 1 mg (1 mg Intravenous Given 04/23/19 0411)  ondansetron (ZOFRAN) injection 4 mg (4 mg Intravenous Given 04/23/19 0411)  iopamidol (ISOVUE-370) 76 % injection 125 mL (125 mLs Intravenous Contrast Given 04/23/19 0421)  sodium chloride 0.9 % bolus 1,000 mL (1,000 mLs Intravenous New Bag/Given 04/23/19 0617)     ED Discharge Orders    None       Note:  This document was prepared using Dragon voice recognition software and may include unintentional dictation errors.   Hinda Kehr, MD 04/23/19 [phone]

## 2019-04-23 NOTE — H&P (Addendum)
[facility] [employer] NAME: Andrea Lloyd    MR#:  [id number]  DATE OF BIRTH:  1956/04/06  DATE OF ADMISSION:  04/23/2019  PRIMARY CARE PHYSICIAN: Patient, [name]   REQUESTING/REFERRING PHYSICIAN: Hinda Kehr MD  CHIEF COMPLAINT:   Chief Complaint  Patient presents with   Back Pain   HISTORY OF PRESENT ILLNESS:  63 y.o. female with pertinent past medical history of 3 grade 3A follicular lymphoma and bulky retroperitoneal adenopathy with left hydronephrosis s/p left ureteral stent placement (03/28/2019) and GERD presenting to the ED with chief complaints of acute low back pain since 8 pm on 04/22/2019.  Patient reports progressive pain started in her lower mid back radiating around her ribs bilateral thighs, chest and up to the left side of her neck.  She reports associated symptoms of shortness of breath and difficulty walking due to bilateral lower extremity weakness.  Denies fevers or chills, nausea or vomiting, dizziness, headache, diarrhea or other associated symptoms.  Patient states she received Neulasta 2 days ago to increase her white count at her oncology visit.  She also had a telemedicine visit 3 days ago with her urologist Dr. Bernardo Heater for follow-up of a possible distal migration of the stent on a CT-guided biopsy on 04/03/2019.  At that time the plan was to continue with current stent as she was asymptomatic.  On arrival to the ED, he was afebrile with blood pressure 130/107 mm Hg and pulse rate 110 beats/min. There were no focal neurological deficits; she was alert and oriented x4 but appeared to be in distress with spasm of pain, diaphoretic, moaning and crying.  Initial labs revealed WBC 60.8, slightly elevated ALT 51, lipase 31, procalcitonin 0.24, UA with trace leukocytes, lactic acid 3.1, troponin I high-sensitivity >18. CTA chest/abdomen/pelvis showed no new acute abnormalities. Patient will be admitted under hospitalist  service in consultation with her urologist and oncologist.  PAST MEDICAL HISTORY:   Past Medical History:  Diagnosis Date   Asthma    Family history of adverse reaction to anesthesia    Father - slow to wake   GERD (gastroesophageal reflux disease)    Gravida 4 para 4    Lymphoma ([place])    PAST SURGICAL HISTORY:   Past Surgical History:  Procedure Laterality Date   ABDOMINAL HYSTERECTOMY     06 01 2013   BLADDER SURGERY     COLONOSCOPY WITH PROPOFOL N/A 02/25/2017   Procedure: COLONOSCOPY WITH PROPOFOL;  Surgeon: Lucilla Lame, MD;  Location: [facility];  Service: Endoscopy;  Laterality: N/A;   CYSTOSCOPY W/ URETERAL STENT PLACEMENT Left 03/28/2019   Procedure: CYSTOSCOPY WITH RETROGRADE PYELOGRAM/URETERAL STENT PLACEMENT;  Surgeon: Abbie Sons, MD;  Location: [facility];  Service: Urology;  Laterality: Left;   IR IMAGING GUIDED PORT INSERTION  04/06/2019   MASS BIOPSY Left 04/10/2019   Procedure: NECK MASS BIOPSY LEFT;  Surgeon: Jules Husbands, MD;  Location: [facility];  Service: General;  Laterality: Left;   POLYPECTOMY  02/25/2017   Procedure: POLYPECTOMY;  Surgeon: Lucilla Lame, MD;  Location: [facility];  Service: Endoscopy;;   PORT A CATH INJECTION ([place] [facility])     ROTATOR CUFF REPAIR Right    TONSILLECTOMY  1972   SOCIAL HISTORY:   Social History   Tobacco Use   Smoking status: Never Smoker   Smokeless tobacco: Never Used  Substance Use Topics   Alcohol use: No   FAMILY HISTORY:  Family History  Problem Relation Age of Onset   Alcohol abuse Maternal Grandfather    Healthy Mother    Cancer Father    Healthy Sister    Healthy Brother    Breast cancer Maternal Aunt    Hyperlipidemia Neg Hx    Heart disease Neg Hx    Diabetes Neg Hx    DRUG ALLERGIES:   Allergies  Allergen Reactions   Adhesive [Tape] Rash    Bandaids   REVIEW OF SYSTEMS:   Review of Systems  Constitutional: Negative for chills, fever,  malaise/fatigue and weight loss.  HENT: Negative for congestion, hearing loss and sore throat.   Eyes: Negative for blurred vision and double vision.  Respiratory: Positive for shortness of breath. Negative for cough and wheezing.   Cardiovascular: Negative for chest pain, palpitations, orthopnea and leg swelling.  Gastrointestinal: Negative for abdominal pain, diarrhea, nausea and vomiting.  Genitourinary: Positive for flank pain. Negative for dysuria and urgency.  Musculoskeletal: Positive for back pain and myalgias.  Skin: Negative for rash.  Neurological: Positive for weakness. Negative for dizziness, sensory change, speech change, focal weakness and headaches.  Psychiatric/Behavioral: Negative for depression.   MEDICATIONS AT HOME:   Prior to Admission medications   Medication Sig Start Date End Date Taking? Authorizing Provider  acyclovir (ZOVIRAX) 400 MG tablet Take 1 tablet (400 mg total) by mouth daily. 04/14/19  Yes Sindy Guadeloupe, MD  albuterol (PROVENTIL HFA) 108 (90 Base) MCG/ACT inhaler Inhale 2 puffs into the lungs every 6 (six) hours as needed for wheezing or shortness of breath.  11/19/17  Yes [provider]  allopurinol (ZYLOPRIM) 300 MG tablet Take 1 tablet (300 mg total) by mouth 2 (two) times daily. 04/03/19  Yes Sindy Guadeloupe, MD  Cholecalciferol (VITAMIN D3 PO) Take 20,000 mcg by mouth daily.   Yes [provider]  HYDROcodone-acetaminophen (NORCO/VICODIN) 5-325 MG tablet Take 1-2 tablets by mouth every 6 (six) hours as needed for moderate pain. 04/10/19  Yes Pabon, Diego F, MD  lidocaine-prilocaine (EMLA) cream Apply to affected area once 04/14/19  Yes Sindy Guadeloupe, MD  LORazepam (ATIVAN) 0.5 MG tablet Take 1 tablet (0.5 mg total) by mouth every 6 (six) hours as needed (Nausea or vomiting). 04/14/19  Yes Sindy Guadeloupe, MD  Menaquinone-7 (VITAMIN K2 PO) Take 200 mcg by mouth daily.   Yes [provider]  Multiple Vitamin (MULTI-VITAMIN DAILY PO)  Take 1 tablet by mouth daily.    Yes [provider]  ondansetron (ZOFRAN) 8 MG tablet Take 1 tablet (8 mg total) by mouth 2 (two) times daily as needed for refractory nausea / vomiting. Start on day 2 after bendamustine chemotherapy. 04/14/19  Yes Sindy Guadeloupe, MD  oxybutynin (DITROPAN) 5 MG tablet Take 1 tablet (5 mg total) by mouth every 8 (eight) hours as needed for bladder spasms. 03/29/19  Yes Sainani, Belia Heman, MD  polyethylene glycol (MIRALAX / GLYCOLAX) 17 g packet Take 17 g by mouth daily.   Yes [provider]  Probiotic Product (PROBIOTIC PO) Take 1 capsule by mouth daily.   Yes [provider]  prochlorperazine (COMPAZINE) 10 MG tablet Take 1 tablet (10 mg total) by mouth every 6 (six) hours as needed (Nausea or vomiting). 04/14/19  Yes Sindy Guadeloupe, MD  dexamethasone (DECADRON) 4 MG tablet Take 2 tablets (8 mg total) by mouth daily. Start the day after bendamustine chemotherapy for 2 days. Take with food. Patient not taking: Reported on 04/17/2019  04/14/19   Sindy Guadeloupe, MD  predniSONE (DELTASONE) 50 MG tablet Take 2 tablets (100 mg total) by mouth daily with breakfast. Patient not taking: Reported on 04/23/2019 04/07/19   Sindy Guadeloupe, MD      VITAL SIGNS:  Blood pressure 113/79, pulse 66, temperature (!) 97.5 F (36.4 C), temperature source Axillary, resp. rate 20, height 5\' 5"  (1.651 m), weight 84.6 kg, SpO2 99 %.  PHYSICAL EXAMINATION:   Physical Exam  GENERAL:  63 y.o.-year-old patient lying in the bed with no acute distress.  EYES: Pupils equal, round, reactive to light and accommodation. No scleral icterus. Extraocular muscles intact.  HEENT: Head atraumatic, normocephalic. Oropharynx and nasopharynx clear.  NECK:  Supple, no jugular venous distention. No thyroid enlargement, no tenderness.  LUNGS: Normal breath sounds bilaterally, no wheezing, rales,rhonchi or crepitation. No use of accessory muscles of respiration.  CARDIOVASCULAR: S1, S2  normal. No murmurs, rubs, or gallops.  ABDOMEN: Soft, nontender, nondistended. Bowel sounds present. No organomegaly or mass.  EXTREMITIES: No pedal edema, cyanosis, or clubbing. No rash or lesions. + pedal pulses MUSCULOSKELETAL: Normal bulk, and power was 5+ grip and elbow, knee, and ankle flexion and extension bilaterally.  NEUROLOGIC:Alert and oriented x 3. CN 2-12 intact. Sensation to light touch and cold stimuli intact bilaterally. Finger to nose nl. Babinski is downgoing. DTR's (biceps, patellar, and achilles) 2+ and symmetric throughout. Gait not tested due to safety concern. PSYCHIATRIC: The patient is alert and oriented x 3.  SKIN: No obvious rash, lesion, or ulcer.   DATA REVIEWED:  LABORATORY PANEL:   CBC Recent Labs  Lab 04/23/19 0419  WBC 60.8*  HGB 13.5  HCT 41.3  PLT 330   ------------------------------------------------------------------------------------------------------------------  Chemistries  Recent Labs  Lab 04/23/19 0419  NA 137  K 4.6  CL 103  CO2 24  GLUCOSE 87  BUN 21  CREATININE 0.76  CALCIUM 9.4  AST 26  ALT 51*  ALKPHOS 109  BILITOT 1.1   ------------------------------------------------------------------------------------------------------------------  Cardiac Enzymes No results for input(s): TROPONINI in the last 168 hours. ------------------------------------------------------------------------------------------------------------------  RADIOLOGY:  Ct L-spine No Charge  Result Date: 04/23/2019 CLINICAL DATA:  Severe back pain. Known lymphoma. History of left ureteral stent. EXAM: CT LUMBAR SPINE WITHOUT CONTRAST TECHNIQUE: Multidetector CT imaging of the lumbar spine was performed without intravenous contrast administration. Multiplanar CT image reconstructions were also generated. COMPARISON:  None. FINDINGS: Segmentation: Normal Alignment: Normal Vertebrae: No bone lesions or fractures are identified. Paraspinal and other soft  tissues: Again noted is bulk E left-sided predominant retroperitoneal lymphadenopathy. The major vascular structures appear normal. There is a left-sided ureteral stent in the upper ureter. Mild left-sided hydronephrosis. Disc levels: No large disc protrusions, spinal or foraminal stenosis is demonstrated. IMPRESSION: Normal alignment of the lumbar vertebral bodies and no acute bony findings or worrisome bone lesions. The spinal canal is generous in is no significant canal or foraminal stenosis. Stable retroperitoneal lymphadenopathy. Left ureteral stent in the upper left ureter.  Mild hydronephrosis. Electronically Signed   By: Marijo Sanes M.D.   On: 04/23/2019 08:59   Ct Angio Chest/abd/pel For Dissection W And/or W/wo  Result Date: 04/23/2019 CLINICAL DATA:  Lower back pain.  Chest pain EXAM: CT ANGIOGRAPHY CHEST, ABDOMEN AND PELVIS TECHNIQUE: Multidetector CT imaging through the chest, abdomen and pelvis was performed using the standard protocol during bolus administration of intravenous contrast. Multiplanar reconstructed images and MIPs were obtained and reviewed to evaluate the vascular anatomy. CONTRAST:  166mL ISOVUE-370 IOPAMIDOL (ISOVUE-370) INJECTION  76% COMPARISON:  PET CT 04/02/2019 FINDINGS: CTA CHEST FINDINGS Cardiovascular: No intramural hematoma the thoracic aorta on noncontrast phase. Negative for aortic dissection or aneurysm. No noted atheromatous changes. No pulmonary artery filling defect. Mediastinum/Nodes: Negative for thoracic adenopathy or mass Lungs/Pleura: There is no edema, consolidation, effusion, or pneumothorax. Musculoskeletal: No acute or aggressive finding Review of the MIP images confirms the above findings. CTA ABDOMEN AND PELVIS FINDINGS VASCULAR Aorta: Minimal atheromatous plaque.  No dissection or aneurysm. Celiac: Vessels are smooth and widely patent SMA: Vessels are smooth and widely patent Renals: Accessory left renal artery. Vessels are smooth and widely patent IMA:  Patent Inflow: Widely patent Veins: Negative in the arterial phase Review of the MIP images confirms the above findings. NON-VASCULAR Hepatobiliary: Rounded hypervascular foci in inferior central liver and segment 3 not seen on most recent portal venous and delayed phase abdominal CT, possibly shunts. These measure up to 16 mm. Patient is under surveillance for lymphoma.No evidence of biliary obstruction or stone. Pancreas: Unremarkable. Spleen: Unremarkable. Adrenals/Urinary Tract: Negative adrenals. Essentially resolved left hydronephrosis. Left ureteral stent with retention loop loosely formed at the level of the upper ureter, alignment similar to 04/03/2019 scout CT. Unremarkable bladder. Stomach/Bowel: Moderate stool volume without colonic over distension. No bowel wall thickening Lymphatic: Retro crural and retroperitoneal lymphadenopathy which has decreased in bulk since 04/28/2019. nodes remain homogeneous. Reproductive:Hysterectomy Other: No ascites or pneumoperitoneum. Musculoskeletal: No acute or aggressive finding. Chronic rim enhancing fluid collection in the left subcutaneous gluteal region. Review of the MIP images confirms the above findings. IMPRESSION: 1. No evidence of acute aortic syndrome. No significant atheromatous changes or stenosis. 2. Lymphoma with interval positive treatment response. 3. Left ureteral stent with loosely formed upper retention loop at the level of the upper ureter, unchanged. 4. 2 hypervascular foci in the liver not seen on most recent PET-CT and abdominal CT, possible shunts. Attention on follow-up. Electronically Signed   By: Monte Fantasia M.D.   On: 04/23/2019 05:03    EKG:   EKG: normal EKG, normal sinus rhythm, unchanged from previous tracings.  IMPRESSION AND PLAN:   63 y.o. female with pertinent past medical history of 3 grade 3A follicular lymphoma and bulky retroperitoneal adenopathy with left hydronephrosis s/p left ureteral stent placement (03/28/2019)  and GERD presenting to the ED with chief complaints of acute low back pain since 8 pm on 04/22/2019.  1. Acute low back pain - Patient with hx of grade 3A stage III follicular lymphoma treated with chemo. - Received Neulasta 04/21/2019 - In the absence of other identifiable causes, acute low back pain is likely a side effect of the Neulasta - CTA chest/abdomen/pelvis showed no new acute abnormalities - CT lumbar spine also negative - Admit to MedSurg floor for observation - PRN IV and p.o. pain meds - IVFs - check LDL and uric acid - Oncology consult - Urology consult  2. Hydronephrosis -secondary to bulky adenopathy due to lymphoma - Status post left ureteral stent placement (03/28/2019), questionable displacement although not likely cause of back pain. - Plan on left retrograde pyelogram and possible stent removal or exchange by urology Dr. Bernardo Heater today - Urology following  3. Leukocytosis -likely secondary to Neulasta  4. UTI - Urine culture pending - Start IV Rocephin  5. DVT prophylaxis - Enoxaparin SubQ    All the records are reviewed and case discussed with ED provider. Management plans discussed with the patient, family and they are in agreement.  CODE STATUS: FULL  TOTAL TIME  TAKING CARE OF THIS PATIENT: 40 minutes.     04/23/2019 at 10:11 AM  This patient was staffed with Dr. Fritzi Mandes who personally evaluated patient, reviewed documentation and agreed with assessment and plan of care as above.  Rufina Falco, DNP, FNP-BC [facility] Nurse Practitioner Between 7am to 6pm - Pager [phone]  After 6pm go to [email or web address] - password [email or web address] [facility]  Office  [phone]  CC: Primary care physician; Patient, [name]

## 2019-04-23 NOTE — ED Notes (Signed)
ED TO INPATIENT HANDOFF REPORT  ED Nurse Name and Phone #: Joellen Jersey [phone]  S Name/Age/Gender Andrea Lloyd 63 y.o. female Room/Bed: ED19A/ED19A  Code Status   Code Status: Full Code  Home/SNF/Other Home Patient oriented to: self, place, time and situation Is this baseline? 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 Adhesive [Tape] Rash    Bandaids    Level of Care/Admitting Diagnosis ED Disposition    ED Disposition Condition [facility] Area: [place] [[id number]]  Level of Care: Med-Surg [16]  Covid Evaluation: Confirmed COVID Negative  Diagnosis: Acute low back pain with radicular symptoms, duration less than 6 weeks [[id number]]  Admitting Physician: Lang Snow [[id number]]  Attending Physician: Rufina Falco ACHIENG [[id number]]  Estimated length of stay: past midnight tomorrow  Certification:: I certify this patient will need inpatient services for at least 2 midnights  PT Class (Do Not Modify): Inpatient [101]  PT Acc Code (Do Not Modify): Private [1]       B Medical/Surgery History Past Medical History:  Diagnosis Date  . Asthma   . Family history of adverse reaction to anesthesia    Father - slow to wake  . GERD (gastroesophageal reflux  disease)   . Gravida 4 para 4   . Lymphoma [facility])    Past Surgical History:  Procedure Laterality Date  . ABDOMINAL HYSTERECTOMY     06 01 2013  . BLADDER SURGERY    . COLONOSCOPY WITH PROPOFOL N/A 02/25/2017   Procedure: COLONOSCOPY WITH PROPOFOL;  Surgeon: Lucilla Lame, MD;  Location: [place];  Service: Endoscopy;  Laterality: N/A;  . CYSTOSCOPY W/ URETERAL STENT PLACEMENT Left 03/28/2019   Procedure: CYSTOSCOPY WITH RETROGRADE PYELOGRAM/URETERAL STENT PLACEMENT;  Surgeon: Abbie Sons, MD;  Location: [facility];  Service: Urology;  Laterality: Left;  . IR IMAGING GUIDED PORT INSERTION  04/06/2019  . MASS BIOPSY Left 04/10/2019   Procedure: NECK MASS BIOPSY LEFT;  Surgeon: Jules Husbands, MD;  Location: [facility];  Service: General;  Laterality: Left;  . POLYPECTOMY  02/25/2017   Procedure: POLYPECTOMY;  Surgeon: Lucilla Lame, MD;  Location: [place];  Service: Endoscopy;;  . PORT A CATH INJECTION ([place] HX)    . ROTATOR CUFF REPAIR Right   . TONSILLECTOMY  1972     A IV Location/Drains/Wounds Patient Lines/Drains/Airways Status   Active Line/Drains/Airways    Name:   Placement date:   Placement time:   Site:   Days:   Implanted Port 04/06/19 Right Chest   04/06/19    1327    Chest   17   Peripheral IV 04/23/19 Left Antecubital   04/23/19    0401    Antecubital   less than 1   Closed  System Drain 1 Left Other (Comment) Bulb (JP) 10 Fr.   04/10/19    1233    Other (Comment)   13   Ureteral Drain/Stent Left ureter 8 Fr.   03/28/19    1457    Left ureter   26   Incision (Closed) 04/10/19 Neck Left   04/10/19    1232     13   Incision (Closed) 04/10/19 Neck Left   04/10/19    1258     13          Intake/Output Last 24 hours  Intake/Output Summary (Last 24 hours) at 04/23/2019 0835 Last data filed at 04/23/2019 0750 Gross per 24 hour  Intake 1000 ml  Output -  Net 1000 ml    Labs/Imaging Results for orders placed or performed during the hospital encounter of  04/23/19 (from the past 48 hour(s))  CBC with Differential/Platelet     Status: Abnormal   Collection Time: 04/23/19  4:19 AM  Result Value Ref Range   WBC 60.8 (HH) 4.0 - 10.5 K/uL    Comment: REPEATED TO VERIFY THIS CRITICAL RESULT HAS VERIFIED AND BEEN CALLED TO ANDREA BRYANT RN BY HANNAH MILES ON 06 25 2020 AT 0513, AND HAS BEEN READ BACK.     RBC 4.82 3.87 - 5.11 MIL/uL   Hemoglobin 13.5 12.0 - 15.0 g/dL   HCT 41.3 36.0 - 46.0 %   MCV 85.7 80.0 - 100.0 fL   MCH 28.0 26.0 - 34.0 pg   MCHC 32.7 30.0 - 36.0 g/dL   RDW 15.2 11.5 - 15.5 %   Platelets 330 150 - 400 K/uL   nRBC 0.0 0.0 - 0.2 %   Neutrophils Relative % 80 %   Neutro Abs 48.4 (H) 1.7 - 7.7 K/uL   Lymphocytes Relative 3 %   Lymphs Abs 2.1 0.7 - 4.0 K/uL   Monocytes Relative 4 %   Monocytes Absolute 2.5 (H) 0.1 - 1.0 K/uL   Eosinophils Relative 0 %   Eosinophils Absolute 0.0 0.0 - 0.5 K/uL   Basophils Relative 0 %   Basophils Absolute 0.0 0.0 - 0.1 K/uL   WBC Morphology MILD LEFT SHIFT (1-5% METAS, OCC MYELO, OCC BANDS)    Immature Granulocytes 13 %   Abs Immature Granulocytes 7.75 (H) 0.00 - 0.07 K/uL    Comment: Performed at [facility], [address] [place], [place] [address]  Comprehensive metabolic panel     Status: Abnormal   Collection Time: 04/23/19  4:19 AM  Result Value Ref Range   Sodium 137 135 - 145 mmol/L   Potassium 4.6 3.5 - 5.1 mmol/L   Chloride 103 98 - 111 mmol/L   CO2 24 22 - 32 mmol/L   Glucose, Bld 87 70 - 99 mg/dL   BUN 21 8 - 23 mg/dL   Creatinine, Ser 0.76 0.44 - 1.00 mg/dL   Calcium 9.4 8.9 - 10.3 mg/dL   Total Protein 6.9 6.5 - 8.1 g/dL   Albumin 4.2 3.5 - 5.0 g/dL   AST 26 15 - 41 U/L   ALT 51 (H) 0 - 44 U/L   Alkaline Phosphatase 109 38 - 126 U/L   Total Bilirubin 1.1 0.3 - 1.2 mg/dL   GFR calc non Af Amer >60 >60 mL/min   GFR calc Af Amer >60 >60 mL/min   Anion gap 10 5 - 15    Comment: Performed at [facility], [address], [place], [place]  [address]  Lipase, blood     Status: None   Collection Time: 04/23/19  4:19 AM  Result Value Ref Range   Lipase 31 11 - 51 U/L    Comment: Performed at [facility], [address] [place], [place] [address]  Procalcitonin     Status: None   Collection Time: 04/23/19  4:19 AM  Result Value Ref Range   Procalcitonin 0.24 ng/mL    Comment:        Interpretation: PCT (Procalcitonin) <= 0.5 ng/mL: Systemic infection (sepsis) is not likely. Local bacterial infection is possible. (NOTE)       Sepsis PCT Algorithm           Lower Respiratory Tract                                      Infection PCT Algorithm    ----------------------------     ----------------------------         PCT < 0.25 ng/mL                PCT < 0.10 ng/mL         Strongly encourage             Strongly discourage   discontinuation of antibiotics    initiation of antibiotics    ----------------------------     -----------------------------       PCT 0.25 - 0.50 ng/mL            PCT 0.10 - 0.25 ng/mL               OR       >80% decrease in PCT            Discourage initiation of                                            antibiotics      Encourage discontinuation           of antibiotics    ----------------------------     -----------------------------         PCT >= 0.50 ng/mL              PCT 0.26 - 0.50 ng/mL               AND        <80% decrease in PCT             Encourage initiation of                                             antibiotics       Encourage continuation           of antibiotics    ----------------------------     -----------------------------        PCT >= 0.50 ng/mL                  PCT > 0.50 ng/mL               AND         increase in PCT  Strongly encourage                                      initiation of antibiotics    Strongly encourage escalation           of antibiotics                                     -----------------------------                                            PCT <= 0.25 ng/mL                                                 OR                                        > 80% decrease in PCT                                     Discontinue / Do not initiate                                             antibiotics Performed at [facility], [address] [place], [place] [address]   Urinalysis, Complete w Microscopic     Status: Abnormal   Collection Time: 04/23/19  4:20 AM  Result Value Ref Range   Color, Urine STRAW (A) YELLOW   APPearance CLEAR (A) CLEAR   Specific Gravity, Urine >1.046 (H) 1.005 - 1.030   pH 6.0 5.0 - 8.0   Glucose, UA NEGATIVE NEGATIVE mg/dL   Hgb urine dipstick MODERATE (A) NEGATIVE   Bilirubin Urine NEGATIVE NEGATIVE   Ketones, ur NEGATIVE NEGATIVE mg/dL   Protein, ur NEGATIVE NEGATIVE mg/dL   Nitrite NEGATIVE NEGATIVE   Leukocytes,Ua TRACE (A) NEGATIVE   RBC / HPF 21-50 0 - 5 RBC/hpf   WBC, UA 6-10 0 - 5 WBC/hpf   Bacteria, UA NONE SEEN NONE SEEN   Squamous Epithelial / LPF 0-5 0 - 5    Comment: Performed at [facility], [address] [place], [place] [address]  Lactic acid, plasma     Status: Abnormal   Collection Time: 04/23/19  4:20 AM  Result Value Ref Range   Lactic Acid, Venous 3.1 (HH) 0.5 - 1.9 mmol/L    Comment: CRITICAL RESULT CALLED TO, READ BACK BY AND VERIFIED WITH  [facility] BRADY AT 2202 04/23/2019 SDR Performed at [facility], [place], [place] [address]   Troponin I (High Sensitivity)     Status: Abnormal   Collection Time: 04/23/19  4:20 AM  Result Value Ref Range   Troponin I (High Sensitivity) 18 (H) <18 ng/L    Comment: (NOTE) Elevated high sensitivity troponin  I (hsTnI) values and significant  changes across serial measurements may suggest ACS but many other  chronic and acute conditions are known to elevate hsTnI results.  Refer to the "Links" section for chest pain algorithms and additional  guidance. Performed at  [facility], [address] [place], [place] [address]   SARS Coronavirus 2 (CEPHEID - Performed in [facility] hospital lab), Hosp Order     Status: None   Collection Time: 04/23/19  4:20 AM   Specimen: Nasopharyngeal Swab  Result Value Ref Range   SARS Coronavirus 2 NEGATIVE NEGATIVE    Comment: (NOTE) If result is NEGATIVE SARS-CoV-2 target nucleic acids are NOT DETECTED. The SARS-CoV-2 RNA is generally detectable in upper and lower  respiratory specimens during the acute phase of infection. The lowest  concentration of SARS-CoV-2 viral copies this assay can detect is 250  copies / mL. A negative result does not preclude SARS-CoV-2 infection  and should not be used as the sole basis for treatment or other  patient management decisions.  A negative result may occur with  improper specimen collection / handling, submission of specimen other  than nasopharyngeal swab, presence of viral mutation(s) within the  areas targeted by this assay, and inadequate number of viral copies  (<250 copies / mL). A negative result must be combined with clinical  observations, patient history, and epidemiological information. If result is POSITIVE SARS-CoV-2 target nucleic acids are DETECTED. The SARS-CoV-2 RNA is generally detectable in upper and lower  respiratory specimens dur ing the acute phase of infection.  Positive  results are indicative of active infection with SARS-CoV-2.  Clinical  correlation with patient history and other diagnostic information is  necessary to determine patient infection status.  Positive results do  not rule out bacterial infection or co-infection with other viruses. If result is PRESUMPTIVE POSTIVE SARS-CoV-2 nucleic acids MAY BE PRESENT.   A presumptive positive result was obtained on the submitted specimen  and confirmed on repeat testing.  While 2019 novel coronavirus  (SARS-CoV-2) nucleic acids may be present in the submitted sample  additional  confirmatory testing may be necessary for epidemiological  and / or clinical management purposes  to differentiate between  SARS-CoV-2 and other Sarbecovirus currently known to infect humans.  If clinically indicated additional testing with an alternate test  methodology [id number]) is advised. The SARS-CoV-2 RNA is generally  detectable in upper and lower respiratory sp ecimens during the acute  phase of infection. The expected result is Negative. Fact Sheet for Patients:  StrictlyIdeas.no Fact Sheet for Healthcare Providers: BankingDealers.co.za This test is not yet approved or cleared by the [place] FDA and has been authorized for detection and/or diagnosis of SARS-CoV-2 by FDA under an Emergency Use Authorization (EUA).  This EUA will remain in effect (meaning this test can be used) for the duration of the COVID-19 declaration under Section 564(b)(1) of the Act, 21 U.S.C. section 360bbb-3(b)(1), unless the authorization is terminated or revoked sooner. Performed at [facility], [place], [place] [address]   Lactic acid, plasma     Status: Abnormal   Collection Time: 04/23/19  6:58 AM  Result Value Ref Range   Lactic Acid, Venous 2.1 (HH) 0.5 - 1.9 mmol/L    Comment: CRITICAL RESULT CALLED TO, READ BACK BY AND VERIFIED WITH GREG MOYER AT 6629 04/23/2019 DAS Performed at [facility] [address], [place], [place] [address]   Troponin I (High Sensitivity)  Status: None   Collection Time: 04/23/19  6:58 AM  Result Value Ref Range   Troponin I (High Sensitivity) 7 <18 ng/L    Comment: (NOTE) Elevated high sensitivity troponin I (hsTnI) values and significant  changes across serial measurements may suggest ACS but many other  chronic and acute conditions are known to elevate hsTnI results.  Refer to the "Links" section for chest pain algorithms and additional  guidance. Performed at  [facility], [address] [place],  [address]    Ct Angio Chest/abd/pel For Dissection W And/or W/wo  Result Date: 04/23/2019 CLINICAL DATA:  Lower back pain.  Chest pain EXAM: CT ANGIOGRAPHY CHEST, ABDOMEN AND PELVIS TECHNIQUE: Multidetector CT imaging through the chest, abdomen and pelvis was performed using the standard protocol during bolus administration of intravenous contrast. Multiplanar reconstructed images and MIPs were obtained and reviewed to evaluate the vascular anatomy. CONTRAST:  128mL ISOVUE-370 IOPAMIDOL (ISOVUE-370) INJECTION 76% COMPARISON:  PET CT 04/02/2019 FINDINGS: CTA CHEST FINDINGS Cardiovascular: No intramural hematoma the thoracic aorta on noncontrast phase. Negative for aortic dissection or aneurysm. No noted atheromatous changes. No pulmonary artery filling defect. Mediastinum/Nodes: Negative for thoracic adenopathy or mass Lungs/Pleura: There is no edema, consolidation, effusion, or pneumothorax. Musculoskeletal: No acute or aggressive finding Review of the MIP images confirms the above findings. CTA ABDOMEN AND PELVIS FINDINGS VASCULAR Aorta: Minimal atheromatous plaque.  No dissection or aneurysm. Celiac: Vessels are smooth and widely patent SMA: Vessels are smooth and widely patent Renals: Accessory left renal artery. Vessels are smooth and widely patent IMA: Patent Inflow: Widely patent Veins: Negative in the arterial phase Review of the MIP images confirms the above findings. NON-VASCULAR Hepatobiliary: Rounded hypervascular foci in inferior central liver and segment 3 not seen on most recent portal venous and delayed phase abdominal CT, possibly shunts. These measure up to 16 mm. Patient is under surveillance for lymphoma.No evidence of biliary obstruction or stone. Pancreas: Unremarkable. Spleen: Unremarkable. Adrenals/Urinary Tract: Negative adrenals. Essentially resolved left hydronephrosis. Left ureteral stent with retention loop loosely formed at  the level of the upper ureter, alignment similar to 04/03/2019 scout CT. Unremarkable bladder. Stomach/Bowel: Moderate stool volume without colonic over distension. No bowel wall thickening Lymphatic: Retro crural and retroperitoneal lymphadenopathy which has decreased in bulk since 04/28/2019. nodes remain homogeneous. Reproductive:Hysterectomy Other: No ascites or pneumoperitoneum. Musculoskeletal: No acute or aggressive finding. Chronic rim enhancing fluid collection in the left subcutaneous gluteal region. Review of the MIP images confirms the above findings. IMPRESSION: 1. No evidence of acute aortic syndrome. No significant atheromatous changes or stenosis. 2. Lymphoma with interval positive treatment response. 3. Left ureteral stent with loosely formed upper retention loop at the level of the upper ureter, unchanged. 4. 2 hypervascular foci in the liver not seen on most recent PET-CT and abdominal CT, possible shunts. Attention on follow-up. Electronically Signed   By: Monte Fantasia M.D.   On: 04/23/2019 05:03    Pending Labs Unresulted Labs (From admission, onward)    Start     Ordered   04/23/19 0403  Urine Culture  Add-on,   AD    Question:  Patient immune status  Answer:  Normal   04/23/19 0403          Vitals/Pain Today's Vitals   04/23/19 0700 04/23/19 0715 04/23/19 0730 04/23/19 0800  BP: 115/72  133/84 132/89  Pulse: 66 86 78 76  Resp: (!) 8 12 (!) 23 17  Temp:      TempSrc:  SpO2: 99% 95% 100% 99%  Weight:      Height:      PainSc:        Isolation Precautions No active isolations  Medications Medications  allopurinol (ZYLOPRIM) tablet 300 mg (has no administration in time range)  HYDROcodone-acetaminophen (NORCO/VICODIN) 5-325 MG per tablet 1-2 tablet (has no administration in time range)  acyclovir (ZOVIRAX) tablet 400 mg (has no administration in time range)  LORazepam (ATIVAN) tablet 0.5 mg (has no administration in time range)  prochlorperazine  (COMPAZINE) tablet 10 mg (has no administration in time range)  polyethylene glycol (MIRALAX / GLYCOLAX) packet 17 g (has no administration in time range)  oxybutynin (DITROPAN) tablet 5 mg (has no administration in time range)  Vitamin K2 TABS 200 mcg (has no administration in time range)  albuterol (VENTOLIN HFA) 108 (90 Base) MCG/ACT inhaler 2 puff (has no administration in time range)  lidocaine-prilocaine (EMLA) cream (has no administration in time range)  enoxaparin (LOVENOX) injection 40 mg (has no administration in time range)  0.9 %  sodium chloride infusion (has no administration in time range)  ketorolac (TORADOL) 15 MG/ML injection 15 mg (has no administration in time range)  cefTRIAXone (ROCEPHIN) 1 g in sodium chloride 0.9 % 100 mL IVPB (has no administration in time range)  HYDROmorphone (DILAUDID) injection 1 mg (1 mg Intravenous Given 04/23/19 0411)  ondansetron (ZOFRAN) injection 4 mg (4 mg Intravenous Given 04/23/19 0411)  iopamidol (ISOVUE-370) 76 % injection 125 mL (125 mLs Intravenous Contrast Given 04/23/19 0421)  sodium chloride 0.9 % bolus 1,000 mL (0 mLs Intravenous Stopped 04/23/19 0750)  ketorolac (TORADOL) 30 MG/ML injection 15 mg (15 mg Intravenous Given 04/23/19 0736)  oxybutynin (DITROPAN) tablet 5 mg (5 mg Oral Given 04/23/19 0736)    Mobility walks Low fall risk   Focused Assessments Pt A&O x4, left lower back pain that radiates to the front, hx of lymphoma, renal stent placed 2 weeks ago.    R Recommendations: See Admitting Provider Note  Report given to:   Additional Notes: NA

## 2019-04-23 NOTE — Transfer of Care (Signed)
Immediate Anesthesia Transfer of Care Note  Patient: Andrea Lloyd  Procedure(s) Performed: CYSTOSCOPY WITH LEFT STENT EXCHANGE (Left )  Patient Location: PACU  Anesthesia Type:General  Level of Consciousness: awake, alert  and oriented  Airway & Oxygen Therapy: Patient Spontanous Breathing and Patient connected to face mask oxygen  Post-op Assessment: Report given to RN and Post -op Vital signs reviewed and stable  Post vital signs: Reviewed and stable  Last Vitals:  Vitals Value Taken Time  BP    Temp    Pulse 76 04/23/19 1319  Resp 13 04/23/19 1319  SpO2 99 % 04/23/19 1319  Vitals shown include unvalidated device data.  Last Pain:  Vitals:   04/23/19 1140  TempSrc: Temporal  PainSc: 0-No pain         Complications: No apparent anesthesia complications

## 2019-04-23 NOTE — Anesthesia Preprocedure Evaluation (Signed)
Anesthesia Evaluation  Patient identified by MRN, date of birth, ID band Patient awake    Reviewed: Allergy & Precautions, H&P , NPO status , Patient's Chart, lab work & pertinent test results  History of Anesthesia Complications (+) Family history of anesthesia reaction and history of anesthetic complications (father - slow to wake up)  Airway Mallampati: I  TM Distance: >3 FB Neck ROM: full    Dental  (+) Teeth Intact   Pulmonary neg shortness of breath, asthma , neg recent URI,           Cardiovascular (-) hypertensionnegative cardio ROS       Neuro/Psych negative neurological ROS  negative psych ROS   GI/Hepatic Neg liver ROS, GERD (worse for last few days)  Poorly Controlled,  Endo/Other  negative endocrine ROS  Renal/GU Renal disease (hydronephrosis)     Musculoskeletal   Abdominal   Peds  Hematology lymphoma   Anesthesia Other Findings Past Medical History: No date: Asthma No date: Family history of adverse reaction to anesthesia     Comment:  Father - slow to wake No date: GERD (gastroesophageal reflux disease) No date: Gravida 4 para 4 No date: Lymphoma [facility])  Past Surgical History: No date: ABDOMINAL HYSTERECTOMY     Comment:  06 01 2013 No date: BLADDER SURGERY 02/25/2017: COLONOSCOPY WITH PROPOFOL; N/A     Comment:  Procedure: COLONOSCOPY WITH PROPOFOL;  Surgeon: Lucilla Lame, MD;  Location: [place];  Service:               Endoscopy;  Laterality: N/A; 03/28/2019: CYSTOSCOPY W/ URETERAL STENT PLACEMENT; Left     Comment:  Procedure: CYSTOSCOPY WITH RETROGRADE PYELOGRAM/URETERAL              STENT PLACEMENT;  Surgeon: Abbie Sons, MD;                Location: [facility];  Service: Urology;  Laterality: Left; 04/06/2019: IR IMAGING GUIDED PORT INSERTION 02/25/2017: POLYPECTOMY     Comment:  Procedure: POLYPECTOMY;  Surgeon: Lucilla Lame, MD;                Location:  [place];  Service: Endoscopy;; No date: PORT A CATH INJECTION ([place] HX) No date: ROTATOR CUFF REPAIR; Right 1972: TONSILLECTOMY     Reproductive/Obstetrics negative OB ROS                             Anesthesia Physical  Anesthesia Plan  ASA: II  Anesthesia Plan: General ETT   Post-op Pain Management:    Induction:   PONV Risk Score and Plan: Ondansetron, Dexamethasone, Midazolam and Treatment may vary due to age or medical condition  Airway Management Planned:   Additional Equipment:   Intra-op Plan:   Post-operative Plan:   Informed Consent: I have reviewed the patients History and Physical, chart, labs and discussed the procedure including the risks, benefits and alternatives for the proposed anesthesia with the patient or authorized representative who has indicated his/her understanding and acceptance.     Dental Advisory Given  Plan Discussed with: Anesthesiologist and CRNA  Anesthesia Plan Comments:         Anesthesia Quick Evaluation

## 2019-04-23 NOTE — Consult Note (Signed)
Urology Consult  Chief Complaint: Severe pain  History of Present Illness: Andrea Lloyd is a 63 y.o. female seen in consultation at the request of Dr. Karma Greaser for evaluation of neck pain and stent migration.  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  She had a CT-guided biopsy in early June which showed some distal migration of the stent but she was asymptomatic.  She presented to the ED early this morning complaining of severe spasmodic back pain that radiated into the chest and neck.  There were no identifiable precipitating, aggravating or alleviating factors.  A CT of the chest abdomen and pelvis showed no significant abnormalities.  The proximal end of the stent was in the proximal ureter but her left hydronephrosis had resolved.  No retroperitoneal abnormalities were noted.  She is being admitted to the hospitalist service for hydration and observation.  Past Medical History:  Diagnosis Date   Asthma    Family history of adverse reaction to anesthesia    Father - slow to wake   GERD (gastroesophageal reflux disease)    Gravida 4 para 4    Lymphoma ([place])     Past Surgical History:  Procedure Laterality Date   ABDOMINAL HYSTERECTOMY     06 01 2013   BLADDER SURGERY     COLONOSCOPY WITH PROPOFOL N/A 02/25/2017   Procedure: COLONOSCOPY WITH PROPOFOL;  Surgeon: Lucilla Lame, MD;  Location: [place];  Service: Endoscopy;  Laterality: N/A;   CYSTOSCOPY W/ URETERAL STENT PLACEMENT Left 03/28/2019   Procedure: CYSTOSCOPY WITH RETROGRADE PYELOGRAM/URETERAL STENT PLACEMENT;  Surgeon: Abbie Sons, MD;  Location: [facility];  Service: Urology;  Laterality: Left;   IR IMAGING GUIDED PORT INSERTION  04/06/2019   MASS BIOPSY Left 04/10/2019   Procedure: NECK MASS BIOPSY LEFT;  Surgeon: Jules Husbands, MD;  Location: [facility];  Service:  General;  Laterality: Left;   POLYPECTOMY  02/25/2017   Procedure: POLYPECTOMY;  Surgeon: Lucilla Lame, MD;  Location: [place];  Service: Endoscopy;;   PORT A CATH INJECTION ([facility])     ROTATOR CUFF REPAIR Right    TONSILLECTOMY  1972    Home Medications:  Current Meds  Medication Sig   acyclovir (ZOVIRAX) 400 MG tablet Take 1 tablet (400 mg total) by mouth daily.   albuterol (PROVENTIL HFA) 108 (90 Base) MCG/ACT inhaler Inhale 2 puffs into the lungs every 6 (six) hours as needed for wheezing or shortness of breath.    allopurinol (ZYLOPRIM) 300 MG tablet Take 1 tablet (300 mg total) by mouth 2 (two) times daily.   Cholecalciferol (VITAMIN D3 PO) Take 20,000 mcg by mouth daily.   HYDROcodone-acetaminophen (NORCO/VICODIN) 5-325 MG tablet Take 1-2 tablets by mouth every 6 (six) hours as needed for moderate pain.   lidocaine-prilocaine (EMLA) cream Apply to affected area once   LORazepam (ATIVAN) 0.5 MG tablet Take 1 tablet (0.5 mg total) by mouth every 6 (six) hours as needed (Nausea or vomiting).   Menaquinone-7 (VITAMIN K2 PO) Take 200 mcg by mouth daily.   Multiple Vitamin (MULTI-VITAMIN DAILY PO) Take 1 tablet by mouth daily.    ondansetron (ZOFRAN) 8 MG tablet Take 1 tablet (8 mg total) by mouth 2 (two) times daily as needed for refractory nausea / vomiting. Start on day 2 after bendamustine chemotherapy.   oxybutynin (DITROPAN) 5 MG tablet Take 1 tablet (5 mg total)  by mouth every 8 (eight) hours as needed for bladder spasms.   polyethylene glycol (MIRALAX / GLYCOLAX) 17 g packet Take 17 g by mouth daily.   Probiotic Product (PROBIOTIC PO) Take 1 capsule by mouth daily.   prochlorperazine (COMPAZINE) 10 MG tablet Take 1 tablet (10 mg total) by mouth every 6 (six) hours as needed (Nausea or vomiting).    Allergies:  Allergies  Allergen Reactions   Adhesive [Tape] Rash    Bandaids    Family History  Problem Relation Age of Onset   Alcohol abuse  Maternal Grandfather    Healthy Mother    Cancer Father    Healthy Sister    Healthy Brother    Breast cancer Maternal Aunt    Hyperlipidemia Neg Hx    Heart disease Neg Hx    Diabetes Neg Hx     Social History:  reports that she has never smoked. She has never used smokeless tobacco. She reports that she does not drink alcohol or use drugs.  ROS: A complete review of systems was performed.  All systems are negative except for pertinent findings as noted.  Physical Exam:  Vital signs in last 24 hours: Temp:  [97.8 F (36.6 C)] 97.8 F (36.6 C) (06/25 0353) Pulse Rate:  [61-110] 76 (06/25 0800) Resp:  [8-30] 17 (06/25 0800) BP: (105-133)/(69-107) 132/89 (06/25 0800) SpO2:  [95 %-100 %] 99 % (06/25 0800) Weight:  [84.6 kg] 84.6 kg (06/25 0405) Constitutional:  Alert and oriented, No acute distress HEENT: [place] AT, moist mucus membranes.  Trachea midline, no masses Cardiovascular: Regular rate and rhythm, no clubbing, cyanosis, or edema. Respiratory: Normal respiratory effort, lungs clear bilaterally GI: Abdomen is soft, nontender, nondistended, no abdominal masses GU: No CVA tenderness Skin: No rashes, bruises or suspicious lesions Lymph: No cervical or inguinal adenopathy Neurologic: Grossly intact, no focal deficits, moving all 4 extremities Psychiatric: Normal mood and affect   Laboratory Data:  Recent Labs    04/23/19 0419  WBC 60.8*  HGB 13.5  HCT 41.3   Recent Labs    04/23/19 0419  NA 137  K 4.6  CL 103  CO2 24  GLUCOSE 87  BUN 21  CREATININE 0.76  CALCIUM 9.4   No results for input(s): LABPT, INR in the last 72 hours. No results for input(s): LABURIN in the last 72 hours. Results for orders placed or performed during the hospital encounter of 04/23/19  SARS Coronavirus 2 (CEPHEID - Performed in [facility] hospital lab), Hosp Order     Status: None   Collection Time: 04/23/19  4:20 AM   Specimen: Nasopharyngeal Swab  Result Value Ref Range  Status   SARS Coronavirus 2 NEGATIVE NEGATIVE Final    Comment: (NOTE) If result is NEGATIVE SARS-CoV-2 target nucleic acids are NOT DETECTED. The SARS-CoV-2 RNA is generally detectable in upper and lower  respiratory specimens during the acute phase of infection. The lowest  concentration of SARS-CoV-2 viral copies this assay can detect is 250  copies / mL. A negative result does not preclude SARS-CoV-2 infection  and should not be used as the sole basis for treatment or other  patient management decisions.  A negative result may occur with  improper specimen collection / handling, submission of specimen other  than nasopharyngeal swab, presence of viral mutation(s) within the  areas targeted by this assay, and inadequate number of viral copies  (<250 copies / mL). A negative result must be combined with clinical  observations, patient  history, and epidemiological information. If result is POSITIVE SARS-CoV-2 target nucleic acids are DETECTED. The SARS-CoV-2 RNA is generally detectable in upper and lower  respiratory specimens dur ing the acute phase of infection.  Positive  results are indicative of active infection with SARS-CoV-2.  Clinical  correlation with patient history and other diagnostic information is  necessary to determine patient infection status.  Positive results do  not rule out bacterial infection or co-infection with other viruses. If result is PRESUMPTIVE POSTIVE SARS-CoV-2 nucleic acids MAY BE PRESENT.   A presumptive positive result was obtained on the submitted specimen  and confirmed on repeat testing.  While 2019 novel coronavirus  (SARS-CoV-2) nucleic acids may be present in the submitted sample  additional confirmatory testing may be necessary for epidemiological  and / or clinical management purposes  to differentiate between  SARS-CoV-2 and other Sarbecovirus currently known to infect humans.  If clinically indicated additional testing with an alternate  test  methodology [phone]) is advised. The SARS-CoV-2 RNA is generally  detectable in upper and lower respiratory sp ecimens during the acute  phase of infection. The expected result is Negative. Fact Sheet for Patients:  StrictlyIdeas.no Fact Sheet for Healthcare Providers: BankingDealers.co.za This test is not yet approved or cleared by the [place] FDA and has been authorized for detection and/or diagnosis of SARS-CoV-2 by FDA under an Emergency Use Authorization (EUA).  This EUA will remain in effect (meaning this test can be used) for the duration of the COVID-19 declaration under Section 564(b)(1) of the Act, 21 U.S.C. section 360bbb-3(b)(1), unless the authorization is terminated or revoked sooner. Performed at [facility], [address] [place], [place] [address]      Radiologic Imaging: Ct L-spine No Charge  Result Date: 04/23/2019 CLINICAL DATA:  Severe back pain. Known lymphoma. History of left ureteral stent. EXAM: CT LUMBAR SPINE WITHOUT CONTRAST TECHNIQUE: Multidetector CT imaging of the lumbar spine was performed without intravenous contrast administration. Multiplanar CT image reconstructions were also generated. COMPARISON:  None. FINDINGS: Segmentation: Normal Alignment: Normal Vertebrae: No bone lesions or fractures are identified. Paraspinal and other soft tissues: Again noted is bulk E left-sided predominant retroperitoneal lymphadenopathy. The major vascular structures appear normal. There is a left-sided ureteral stent in the upper ureter. Mild left-sided hydronephrosis. Disc levels: No large disc protrusions, spinal or foraminal stenosis is demonstrated. IMPRESSION: Normal alignment of the lumbar vertebral bodies and no acute bony findings or worrisome bone lesions. The spinal canal is generous in is no significant canal or foraminal stenosis. Stable retroperitoneal lymphadenopathy. Left ureteral stent  in the upper left ureter.  Mild hydronephrosis. Electronically Signed   By: Marijo Sanes M.D.   On: 04/23/2019 08:59   Ct Angio Chest/abd/pel For Dissection W And/or W/wo  Result Date: 04/23/2019 CLINICAL DATA:  Lower back pain.  Chest pain EXAM: CT ANGIOGRAPHY CHEST, ABDOMEN AND PELVIS TECHNIQUE: Multidetector CT imaging through the chest, abdomen and pelvis was performed using the standard protocol during bolus administration of intravenous contrast. Multiplanar reconstructed images and MIPs were obtained and reviewed to evaluate the vascular anatomy. CONTRAST:  114mL ISOVUE-370 IOPAMIDOL (ISOVUE-370) INJECTION 76% COMPARISON:  PET CT 04/02/2019 FINDINGS: CTA CHEST FINDINGS Cardiovascular: No intramural hematoma the thoracic aorta on noncontrast phase. Negative for aortic dissection or aneurysm. No noted atheromatous changes. No pulmonary artery filling defect. Mediastinum/Nodes: Negative for thoracic adenopathy or mass Lungs/Pleura: There is no edema, consolidation, effusion, or pneumothorax. Musculoskeletal: No acute or aggressive finding Review of the MIP images confirms  the above findings. CTA ABDOMEN AND PELVIS FINDINGS VASCULAR Aorta: Minimal atheromatous plaque.  No dissection or aneurysm. Celiac: Vessels are smooth and widely patent SMA: Vessels are smooth and widely patent Renals: Accessory left renal artery. Vessels are smooth and widely patent IMA: Patent Inflow: Widely patent Veins: Negative in the arterial phase Review of the MIP images confirms the above findings. NON-VASCULAR Hepatobiliary: Rounded hypervascular foci in inferior central liver and segment 3 not seen on most recent portal venous and delayed phase abdominal CT, possibly shunts. These measure up to 16 mm. Patient is under surveillance for lymphoma.No evidence of biliary obstruction or stone. Pancreas: Unremarkable. Spleen: Unremarkable. Adrenals/Urinary Tract: Negative adrenals. Essentially resolved left hydronephrosis. Left  ureteral stent with retention loop loosely formed at the level of the upper ureter, alignment similar to 04/03/2019 scout CT. Unremarkable bladder. Stomach/Bowel: Moderate stool volume without colonic over distension. No bowel wall thickening Lymphatic: Retro crural and retroperitoneal lymphadenopathy which has decreased in bulk since 04/28/2019. nodes remain homogeneous. Reproductive:Hysterectomy Other: No ascites or pneumoperitoneum. Musculoskeletal: No acute or aggressive finding. Chronic rim enhancing fluid collection in the left subcutaneous gluteal region. Review of the MIP images confirms the above findings. IMPRESSION: 1. No evidence of acute aortic syndrome. No significant atheromatous changes or stenosis. 2. Lymphoma with interval positive treatment response. 3. Left ureteral stent with loosely formed upper retention loop at the level of the upper ureter, unchanged. 4. 2 hypervascular foci in the liver not seen on most recent PET-CT and abdominal CT, possible shunts. Attention on follow-up. Electronically Signed   By: Monte Fantasia M.D.   On: 04/23/2019 05:03    Impression/Assessment:  63 year old female status post left ureteral stent placement for hydronephrosis secondary to bulky adenopathy due to lymphoma.  Her hydronephrosis has resolved but the proximal stent in is in the proximal ureter.  It is unlikely this would explain her severe pain however since she is being admitted will plan on left retrograde pyelogram and either stent removal or exchange..  Plan:  As above.  The procedure was discussed in detail occluding potential risks of bleeding, infection/sepsis.  She desires to proceed.   04/23/2019, 9:09 AM  John Giovanni,  MD

## 2019-04-23 NOTE — ED Notes (Signed)
Pt returned from CT °

## 2019-04-23 NOTE — Anesthesia Post-op Follow-up Note (Signed)
Anesthesia QCDR form completed.        

## 2019-04-23 NOTE — Anesthesia Procedure Notes (Signed)
Procedure Name: Intubation Date/Time: 04/23/2019 12:46 PM Performed by: Nelda Marseille, CRNA Pre-anesthesia Checklist: Patient identified, Patient being monitored, Timeout performed, Emergency Drugs available and Suction available Patient Re-evaluated:Patient Re-evaluated prior to induction Oxygen Delivery Method: Circle system utilized Preoxygenation: Pre-oxygenation with 100% oxygen Induction Type: IV induction Ventilation: Mask ventilation without difficulty Laryngoscope Size: Mac, 3 and Glidescope Grade View: Grade II Tube type: Oral Tube size: 7.0 mm Number of attempts: 1 Airway Equipment and Method: Stylet Placement Confirmation: ETT inserted through vocal cords under direct vision,  positive ETCO2 and breath sounds checked- equal and bilateral Secured at: 21 cm Tube secured with: Tape Dental Injury: Teeth and Oropharynx as per pre-operative assessment

## 2019-04-23 NOTE — Op Note (Signed)
Preoperative diagnosis:  1. Migrated left ureteral stent  Postoperative diagnosis:  1. Migrated left ureteral stent 2. Left hydronephrosis  Procedure: 1. Cystoscopy with left ureteral stent exchange  Surgeon: Abbie Sons, MD  Anesthesia: General  Complications: None  Intraoperative findings: Distal stent end in the proximal ureter.  Moderate left hydronephrosis with retention of contrast under real-time fluoroscopy  EBL: None  Specimens: None  Indication: Andrea Lloyd is a 63 y.o. 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  She underwent placement of a left ureteral stent on 03/28/2019.  She presented to the ED early this morning with severe back chest and neck pain.  CT was performed which showed significant improvement in her left hydronephrosis and migration of the stent distally.  She was being admitted for hydration and cystoscopy with left retrograde pyelogram and either stent removal or exchange has been recommended.    After reviewing the management options for treatment, he elected to proceed with the above surgical procedure(s). We have discussed the potential benefits and risks of the procedure, side effects of the proposed treatment, the likelihood of the patient achieving the goals of the procedure, and any potential problems that might occur during the procedure or recuperation. Informed consent has been obtained.  Description of procedure:  The patient was taken to the operating room and general anesthesia was induced.  The patient was placed in the dorsal lithotomy position, prepped and draped in the usual sterile fashion, and preoperative antibiotics were administered. A preoperative time-out was performed.   A 21 French cystoscope was lubricated and passed per urethra.  Panendoscopy was performed and no bladder mucosal lesions were noted with the exception of inflammatory changes at the left  hemitrigone secondary to her indwelling stent.  The stent was grasped with endoscopic forceps and brought out to the urethral meatus.  A 0.038 sensor wire was placed to the stent and advanced to the renal pelvis under fluoroscopic guidance.  The stent was removed and a 5 [place] open-ended ureteral catheter was placed over the wire.  The guidewire was removed and a pullout retrograde pyelogram was performed.  Moderate hydronephrosis was noted.  Under real-time fluoroscopy there was prompt emptying of contrast from the ureter however retention of contrast proximal to the UPJ.  It was elected to replace her stent at this point.  The sensor wire was placed through the cystoscope and up the left ureter under fluoroscopic guidance.  A 6 French/28 cm  Contour ureteral stent was placed without difficulty.  Adequate positioning proximally and distally was noted.  The bladder was emptied and the cystoscope was removed.  After anesthetic reversal she was transported to the PACU in stable condition.  Abbie Sons, M.D.

## 2019-04-23 NOTE — Consult Note (Signed)
Hematology/Oncology Consult note [facility] [phone] [phone]  Patient Care Team: Patient, [name] as PCP - General (General Practice)   Name of the patient: Andrea Lloyd  [id number]  06/09/1956    Reason for consult: acute low back pain   Requesting physician: Rufina Falco, NP  Date of visit:04/23/2019    History of presenting illness- Patient is a 63 year old female with newly diagnosed grade 3A stage III follicular lymphoma.  She is status post 1 cycle of bendamustine and Rituxan and received Neulasta on 04/21/2019.  She was admitted to the hospital with symptoms of worsening low back pain that started yesterday night around 8 PM and then got progressively severe and encircling her lower back as well as radiating around her rib cage and bilateral lower extremities.  CT chest abdomen and pelvis did not reveal any acute pathology.  Interval improvement in her adenopathy.  CT lumbar spine was also unremarkable.  Patient currently feels better after pain control.  She was noted to have 6-10 WBCs and trace leukocytes in her urine and is also being currently treated for possible UTI.  Patient reports her pain is better controlled at this time  ECOG PS- 0  Pain scale- 3   Review of systems- Review of Systems  Constitutional: Positive for malaise/fatigue. Negative for chills, fever and weight loss.  HENT: Negative for congestion, ear discharge and nosebleeds.   Eyes: Negative for blurred vision.  Respiratory: Negative for cough, hemoptysis, sputum production, shortness of breath and wheezing.   Cardiovascular: Negative for chest pain, palpitations, orthopnea and claudication.  Gastrointestinal: Negative for abdominal pain, blood in stool, constipation, diarrhea, heartburn, melena, nausea and vomiting.  Genitourinary: Negative for dysuria, flank pain, frequency, hematuria and urgency.  Musculoskeletal: Positive for back pain.  Negative for joint pain and myalgias.  Skin: Negative for rash.  Neurological: Negative for dizziness, tingling, focal weakness, seizures, weakness and headaches.  Endo/Heme/Allergies: Does not bruise/bleed easily.  Psychiatric/Behavioral: Negative for depression and suicidal ideas. The patient does not have insomnia.     Allergies  Allergen Reactions   Adhesive [Tape] Rash    Bandaids    Patient Active Problem List   Diagnosis Date Noted   Acute low back pain with radicular symptoms, duration less than 6 weeks 04/23/2019   Hydronephrosis 03/29/2019   Hydronephrosis of left kidney 03/28/2019   Abdominal mass    Ureteral obstruction, left    Hyperuricemia    Follicular lymphoma of intra-abdominal lymph nodes (HCC)    Colon cancer screening    Benign neoplasm of transverse colon    Polyp of sigmoid colon    Follicular lymphoma grade I of intra-abdominal lymph nodes ([place]) 01/28/2017   Abnormal computed tomography angiography (CTA) of abdomen and pelvis 01/25/2017   Airway hyperreactivity 04/06/2015   Disorder of rotator cuff 04/20/2014     Past Medical History:  Diagnosis Date   Asthma    Family history of adverse reaction to anesthesia    Father - slow to wake   GERD (gastroesophageal reflux disease)    Gravida 4 para 4    Lymphoma ([place])      Past Surgical History:  Procedure Laterality Date   ABDOMINAL HYSTERECTOMY     06 01 2013   BLADDER SURGERY     COLONOSCOPY WITH PROPOFOL N/A 02/25/2017   Procedure: COLONOSCOPY WITH PROPOFOL;  Surgeon: Lucilla Lame, MD;  Location: [place];  Service: Endoscopy;  Laterality: N/A;   CYSTOSCOPY W/  URETERAL STENT PLACEMENT Left 03/28/2019   Procedure: CYSTOSCOPY WITH RETROGRADE PYELOGRAM/URETERAL STENT PLACEMENT;  Surgeon: Abbie Sons, MD;  Location: [facility];  Service: Urology;  Laterality: Left;   IR IMAGING GUIDED PORT INSERTION  04/06/2019   MASS BIOPSY Left 04/10/2019   Procedure: NECK  MASS BIOPSY LEFT;  Surgeon: Jules Husbands, MD;  Location: [facility];  Service: General;  Laterality: Left;   POLYPECTOMY  02/25/2017   Procedure: POLYPECTOMY;  Surgeon: Lucilla Lame, MD;  Location: [place];  Service: Endoscopy;;   PORT A CATH INJECTION ([place] [facility])     ROTATOR CUFF REPAIR Right    TONSILLECTOMY  1972    Social History   Socioeconomic History   Marital status: Married    Spouse name: Not on file   Number of children: Not on file   Years of education: Not on file   Highest education level: Not on file  Occupational History   Not on file  Social Needs   Financial resource strain: Not on file   Food insecurity    Worry: Not on file    Inability: Not on file   Transportation needs    Medical: Not on file    Non-medical: Not on file  Tobacco Use   Smoking status: Never Smoker   Smokeless tobacco: Never Used  Substance and Sexual Activity   Alcohol use: No   Drug use: No   Sexual activity: Yes  Lifestyle   Physical activity    Days per week: Not on file    Minutes per session: Not on file   Stress: Not on file  Relationships   Social connections    Talks on phone: Not on file    Gets together: Not on file    Attends religious service: Not on file    Active member of club or organization: Not on file    Attends meetings of clubs or organizations: Not on file    Relationship status: Not on file   Intimate partner violence    Fear of current or ex partner: Not on file    Emotionally abused: Not on file    Physically abused: Not on file    Forced sexual activity: Not on file  Other Topics Concern   Not on file  Social History Narrative   Not on file     Family History  Problem Relation Age of Onset   Alcohol abuse Maternal Grandfather    Healthy Mother    Cancer Father    Healthy Sister    Healthy Brother    Breast cancer Maternal Aunt    Hyperlipidemia Neg Hx    Heart disease Neg Hx    Diabetes Neg Hx       Current Facility-Administered Medications:    0.9 %  sodium chloride infusion, , Intravenous, Continuous, Ouma, Bing Neighbors, NP, Last Rate: 75 mL/hr at 04/23/19 1119   [MAR Hold] acyclovir (ZOVIRAX) 200 MG capsule 400 mg, 400 mg, Oral, Daily, Ouma, Bing Neighbors, NP   [MAR Hold] albuterol (PROVENTIL) (2.5 MG/3ML) 0.083% nebulizer solution 2.5 mg, 2.5 mg, Nebulization, Q6H PRN, Ouma, Bing Neighbors, NP   [MAR Hold] allopurinol (ZYLOPRIM) tablet 300 mg, 300 mg, Oral, BID, Ouma, Bing Neighbors, NP   [facility] Hold] cefTRIAXone (ROCEPHIN) 1 g in sodium chloride 0.9 % 100 mL IVPB, 1 g, Intravenous, Q24H, Ouma, Bing Neighbors, NP, Stopped at 04/23/19 1046   [MAR Hold] enoxaparin (LOVENOX) injection 40 mg, 40 mg, Subcutaneous, Q24H, Rufina Falco  Achieng, NP   [MAR Hold] HYDROcodone-acetaminophen (NORCO/VICODIN) 5-325 MG per tablet 1-2 tablet, 1-2 tablet, Oral, Q6H PRN, Ouma, Bing Neighbors, NP   [facility] Hold] ketorolac (TORADOL) 15 MG/ML injection 15 mg, 15 mg, Intravenous, Q6H PRN, Ouma, Bing Neighbors, NP   [MAR Hold] lidocaine-prilocaine (EMLA) cream, , Topical, Once, Ouma, Bing Neighbors, NP   [facility] Hold] LORazepam (ATIVAN) tablet 0.5 mg, 0.5 mg, Oral, Q6H PRN, Ouma, Bing Neighbors, NP   [MAR Hold] multivitamin with minerals tablet 1 tablet, 1 tablet, Oral, Daily, Ouma, Bing Neighbors, NP   [facility] Hold] oxybutynin (DITROPAN) tablet 5 mg, 5 mg, Oral, Q8H PRN, Ouma, Bing Neighbors, NP   [facility] Hold] polyethylene glycol (MIRALAX / GLYCOLAX) packet 17 g, 17 g, Oral, Daily, Ouma, Bing Neighbors, NP   [facility] Hold] prochlorperazine (COMPAZINE) tablet 10 mg, 10 mg, Oral, Q6H PRN, Lang Snow, NP   Physical exam:  Vitals:   04/23/19 0730 04/23/19 0800 04/23/19 0920 04/23/19 1140  BP: 133/84 132/89 113/79 116/83  Pulse: 78 76 66   Resp: (!) _0 Temp:   (!) 97.5 F (36.4 C) 98 F (36.7 C)  TempSrc:   Axillary Temporal  SpO2: 100% 99%  99% 97%  Weight:    186 lb 8.2 oz (84.6 kg)  Height:    5' 5" (1.651 m)   Physical Exam Constitutional:      General: She is not in acute distress. HENT:     Head: Normocephalic and atraumatic.  Eyes:     Pupils: Pupils are equal, round, and reactive to light.  Neck:     Musculoskeletal: Normal range of motion.  Cardiovascular:     Rate and Rhythm: Normal rate and regular rhythm.     Heart sounds: Normal heart sounds.  Pulmonary:     Effort: Pulmonary effort is normal.     Breath sounds: Normal breath sounds.  Abdominal:     General: Bowel sounds are normal.     Palpations: Abdomen is soft.  Musculoskeletal:     Comments: There is focal tenderness to palpation over L4 lumbar spine  Skin:    General: Skin is warm and dry.  Neurological:     Mental Status: She is alert and oriented to person, place, and time.        CMP Latest Ref Rng & Units 04/23/2019  Glucose 70 - 99 mg/dL 87  BUN 8 - 23 mg/dL 21  Creatinine 0.44 - 1.00 mg/dL 0.76  Sodium 135 - 145 mmol/L 137  Potassium 3.5 - 5.1 mmol/L 4.6  Chloride 98 - 111 mmol/L 103  CO2 22 - 32 mmol/L 24  Calcium 8.9 - 10.3 mg/dL 9.4  Total Protein 6.5 - 8.1 g/dL 6.9  Total Bilirubin 0.3 - 1.2 mg/dL 1.1  Alkaline Phos 38 - 126 U/L 109  AST 15 - 41 U/L 26  ALT 0 - 44 U/L 51(H)   CBC Latest Ref Rng & Units 04/23/2019  WBC 4.0 - 10.5 K/uL 60.8(HH)  Hemoglobin 12.0 - 15.0 g/dL 13.5  Hematocrit 36.0 - 46.0 % 41.3  Platelets 150 - 400 K/uL 330    _1 @  Dg Abd 1 View  Result Date: 03/28/2019 CLINICAL DATA:  Left ureteral stent placement. FLUOROSCOPY TIME:  42 seconds. Images: 2 EXAM: ABDOMEN - 1 VIEW COMPARISON:  CT scan Mar 28, 2019 FINDINGS: By the end of the study, a stent has been placed in the left ureter. Only the proximal aspect of the stent is identified. The  proximal portion of the stent has not formed a complete pigtail. Hydronephrosis remains on the final image of the study. IMPRESSION: Left ureteral stent  placement as above. Electronically Signed   By: Dorise Bullion III M.D   On: 03/28/2019 17:42   Ct Abdomen Pelvis W Contrast  Result Date: 03/28/2019 CLINICAL DATA:  Left lower quadrant abdominal pain and peritoneal signs on exam. Prior hysterectomy. History of follicular lymphoma. EXAM: CT ABDOMEN AND PELVIS WITH CONTRAST TECHNIQUE: Multidetector CT imaging of the abdomen and pelvis was performed using the standard protocol following bolus administration of intravenous contrast. CONTRAST:  124m OMNIPAQUE IOHEXOL 300 MG/ML  SOLN COMPARISON:  12/31/2016 CT abdomen/pelvis and 01/08/2017 PET-CT. FINDINGS: Lower chest: Trace dependent left pleural effusion. New bilateral retrocrural adenopathy measuring up to 2.2 cm (series 2/image 23). Hepatobiliary: Normal liver size. No liver mass. Distended gallbladder. No gallbladder wall thickening. No radiopaque cholelithiasis. Minimal central intrahepatic biliary ductal dilatation, unchanged. CBD diameter 7 mm, slightly dilated, unchanged. No radiopaque choledocholithiasis. Pancreas: Normal, with no mass or duct dilation. Spleen: Normal size. No mass. Adrenals/Urinary Tract: Normal adrenals. New moderate left hydronephrosis with mild left perinephric edema. No right hydronephrosis. No renal masses. Normal bladder. Stomach/Bowel: Normal non-distended stomach. Normal caliber small bowel with no small bowel wall thickening. Normal appendix. Scattered mild colonic diverticulosis, with no large bowel wall thickening or significant pericolonic fat stranding. Vascular/Lymphatic: Atherosclerotic nonaneurysmal abdominal aorta. Patent portal, splenic, hepatic and renal veins. Massive confluent retroperitoneal adenopathy throughout pericaval, aortocaval and left periaortic chains, significantly increased from prior. Representative 7.3 cm short axis left para-aortic node (series 2/image 36), increased from 1.6 cm. Representative short axis 5.5 cm aortocaval node (series 2/image 32),  increased from 2.1 cm. Reproductive: Status post hysterectomy, with no abnormal findings at the vaginal cuff. No adnexal mass. Other: No ascites. No pneumoperitoneum. Large subcutaneous 8.1 x 4.3 cm collection in the left gluteal region with thick wall (series 2/image 66), new from prior. Musculoskeletal: No aggressive appearing focal osseous lesions. Mild thoracolumbar spondylosis. IMPRESSION: 1. 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 2. New moderate left hydronephrosis due to extrinsic obstruction from the progressive adenopathy. 3. Trace dependent left pleural effusion. 4. Mild colonic diverticulosis, with no evidence of acute diverticulitis. No evidence of bowel obstruction. 5. Large subcutaneous collection in the left gluteal region with thick wall, nonspecific, potentially posttraumatic hematoma. Infected collection cannot be excluded. 6.  Aortic Atherosclerosis (ICD10-I70.0). Electronically Signed   By: JIlona SorrelM.D.   On: 03/28/2019 10:40   Nm Pet Image Restag (ps) Skull Base To Thigh  Result Date: 04/02/2019 CLINICAL DATA:  Subsequent treatment strategy for lymphoma. EXAM: NUCLEAR MEDICINE PET SKULL BASE TO THIGH TECHNIQUE: 10.1 mCi F-18 FDG was injected intravenously. Full-ring PET imaging was performed from the skull base to thigh after the radiotracer. CT data was obtained and used for attenuation correction and anatomic localization. Fasting blood glucose: 95 mg/dl COMPARISON:  PET-CT 01/08/2017. and CT AP from 03/28/2019 FINDINGS: Mediastinal blood pool activity: SUV max 2.2 Liver activity: SUV max 3.7 NECK: Enlarged left supraclavicular lymph node measures 1.9 cm and has an SUV max of 17.05, Deauville criteria 5. Previously this measured 0.7 cm with an SUV max of 4.1. Incidental CT findings: none CHEST: No hypermetabolic mediastinal or hilar  nodes. No suspicious pulmonary nodules on the CT scan. Incidental CT findings: Small left pleural effusion.  New. ABDOMEN/PELVIS: No abnormal uptake within the liver,  pancreas, or spleen. The spleen measures 10.5 cm in length with SUV max similar to liver activity. Extensive, bulky retroperitoneal lymph nodes identified. Dominant retroperitoneal mass measures 12.3 by 8.7 by 12.3 cm and has an SUV max of 23.9, Deauville criteria 5. On previous exam the largest retroperitoneal lymph node had a short axis of 1.9 cm and an SUV max of 6.4. FDG avid enlarged retrocrural lymph nodes noted measuring 4.0 x 2.5 cm within SUV max of 18.5, Deauville criteria 5. previously this measured 0.9 cm within SUV max of 3.19. Incidental CT findings: Left-sided hydronephrosis is identified with newly placed ureteral stent. SKELETON: No focal hypermetabolic activity to suggest skeletal metastasis. Incidental CT findings: There is a mass within the left gluteal region which measures 7.5 by 6.1 cm. This is peripherally FDG avid suggesting underlying inflammatory or infectious process. IMPRESSION: 1. Examination compatible with recurrent progressive lymphoma, Deauville criteria 5. 2. Enlarged left supraclavicular, retrocrural and retroperitoneal adenopathy identified. Sites of disease demonstrate significant increase in size and degree of FDG uptake compared with previous. 3. Left-sided hydronephrosis status post ureteral stent placement 4. Persistent left gluteal region subcutaneous fluid collection with peripheral FDG uptake compatible with an inflammatory or infectious process. 5. Small left pleural effusion.  New. Electronically Signed   By: Kerby Moors M.D.   On: 04/02/2019 14:26   Ct L-spine No Charge  Result Date: 04/23/2019 CLINICAL DATA:  Severe back pain. Known lymphoma. History of left ureteral stent. EXAM: CT LUMBAR SPINE WITHOUT CONTRAST TECHNIQUE: Multidetector CT imaging of the lumbar spine was performed without  intravenous contrast administration. Multiplanar CT image reconstructions were also generated. COMPARISON:  None. FINDINGS: Segmentation: Normal Alignment: Normal Vertebrae: No bone lesions or fractures are identified. Paraspinal and other soft tissues: Again noted is bulk E left-sided predominant retroperitoneal lymphadenopathy. The major vascular structures appear normal. There is a left-sided ureteral stent in the upper ureter. Mild left-sided hydronephrosis. Disc levels: No large disc protrusions, spinal or foraminal stenosis is demonstrated. IMPRESSION: Normal alignment of the lumbar vertebral bodies and no acute bony findings or worrisome bone lesions. The spinal canal is generous in is no significant canal or foraminal stenosis. Stable retroperitoneal lymphadenopathy. Left ureteral stent in the upper left ureter.  Mild hydronephrosis. Electronically Signed   By: Marijo Sanes M.D.   On: 04/23/2019 08:59   Ct Biopsy  Result Date: 04/03/2019 CLINICAL DATA:  History of grade 2 follicular lymphoma of retroperitoneal lymph nodes and status post prior lymph node biopsy on 01/21/2017. The patient now presents with significant progressive lymph node enlargement and PET scan evidence of progressive disease. Repeat lymph node biopsy now necessary to restage lymphoma. EXAM: CT GUIDED CORE BIOPSY OF LEFT PARA-AORTIC RETROPERITONEAL LYMPHADENOPATHY ANESTHESIA/SEDATION: 4.0 mg IV Versed; 100 mcg IV Fentanyl Total Moderate Sedation Time:  45 minutes. The patient's level of consciousness and physiologic status were continuously monitored during the procedure by Radiology nursing. Sedation represents total sedation administered for combined CT-guided bone marrow and lymph node biopsy procedures. PROCEDURE: The procedure risks, benefits, and alternatives were explained to the patient. Questions regarding the procedure were encouraged and answered. The patient understands and consents to the procedure. A time-out was  performed prior to initiating the procedure. CT was performed through the lower abdomen in a prone position. The left translumbar region was prepped with chlorhexidine in a sterile fashion, and a sterile drape was applied covering the operative field. A sterile gown and sterile gloves were used for the procedure. Local anesthesia was provided with 1%  Lidocaine. Under CT guidance, a 17 gauge needle was advanced from a left trans lumbar approach to the level of enlarged para-aortic lymphadenopathy. Coaxial 18 gauge core biopsy samples were obtained. A total of 6 core biopsy samples were submitted on saline soaked Telfa gauze. COMPLICATIONS: None FINDINGS: Largest area confluent lymphadenopathy measuring approximately 10 cm in maximum diameter was targeted in the left para-aortic region medial to a displaced ureteral stent. Solid tissue was obtained. IMPRESSION: CT-guided core biopsy performed of large lymph node mass in the left para-aortic retroperitoneum. Electronically Signed   By: Aletta Edouard M.D.   On: 04/03/2019 13:29   Ct Bone Marrow Biopsy & Aspiration  Result Date: 04/03/2019 CLINICAL DATA:  Follicular lymphoma with CT and PET scan evidence progressive lymph node disease and need for bone marrow biopsy for staging. EXAM: CT GUIDED BONE MARROW ASPIRATION AND BIOPSY ANESTHESIA/SEDATION: Versed 4.0 mg IV, Fentanyl 100 mcg IV Total Moderate Sedation Time:   45 minutes. The patient's level of consciousness and physiologic status were continuously monitored during the procedure by Radiology nursing. Sedation and sedation time reflect sedation given for combined bone marrow and lymph node biopsy procedures. PROCEDURE: The procedure risks, benefits, and alternatives were explained to the patient. Questions regarding the procedure were encouraged and answered. The patient understands and consents to the procedure. A time out was performed prior to initiating the procedure. The right gluteal region was prepped  with chlorhexidine. Sterile gown and sterile gloves were used for the procedure. Local anesthesia was provided with 1% Lidocaine. Under CT guidance, an 11 gauge On Control bone cutting needle was advanced from a posterior approach into the right iliac bone. Needle positioning was confirmed with CT. Initial non heparinized and heparinized aspirate samples were obtained of bone marrow. Core biopsy was performed via the On Control drill needle. COMPLICATIONS: None FINDINGS: Inspection of initial aspirate did reveal visible particles. Intact core biopsy sample was obtained. IMPRESSION: CT guided bone marrow biopsy of right posterior iliac bone with both aspirate and core samples obtained. Electronically Signed   By: Aletta Edouard M.D.   On: 04/03/2019 13:26   Dg C-arm 1-60 Min  Result Date: 03/28/2019 CLINICAL DATA:  Left ureteral stent placement. FLUOROSCOPY TIME:  42 seconds. Images: 2 EXAM: ABDOMEN - 1 VIEW COMPARISON:  CT scan Mar 28, 2019 FINDINGS: By the end of the study, a stent has been placed in the left ureter. Only the proximal aspect of the stent is identified. The proximal portion of the stent has not formed a complete pigtail. Hydronephrosis remains on the final image of the study. IMPRESSION: Left ureteral stent placement as above. Electronically Signed   By: Dorise Bullion III M.D   On: 03/28/2019 17:42   Ct Angio Chest/abd/pel For Dissection W And/or W/wo  Result Date: 04/23/2019 CLINICAL DATA:  Lower back pain.  Chest pain EXAM: CT ANGIOGRAPHY CHEST, ABDOMEN AND PELVIS TECHNIQUE: Multidetector CT imaging through the chest, abdomen and pelvis was performed using the standard protocol during bolus administration of intravenous contrast. Multiplanar reconstructed images and MIPs were obtained and reviewed to evaluate the vascular anatomy. CONTRAST:  186m ISOVUE-370 IOPAMIDOL (ISOVUE-370) INJECTION 76% COMPARISON:  PET CT 04/02/2019 FINDINGS: CTA CHEST FINDINGS Cardiovascular: No intramural  hematoma the thoracic aorta on noncontrast phase. Negative for aortic dissection or aneurysm. No noted atheromatous changes. No pulmonary artery filling defect. Mediastinum/Nodes: Negative for thoracic adenopathy or mass Lungs/Pleura: There is no edema, consolidation, effusion, or pneumothorax. Musculoskeletal: No acute or aggressive finding Review of the MIP  images confirms the above findings. CTA ABDOMEN AND PELVIS FINDINGS VASCULAR Aorta: Minimal atheromatous plaque.  No dissection or aneurysm. Celiac: Vessels are smooth and widely patent SMA: Vessels are smooth and widely patent Renals: Accessory left renal artery. Vessels are smooth and widely patent IMA: Patent Inflow: Widely patent Veins: Negative in the arterial phase Review of the MIP images confirms the above findings. NON-VASCULAR Hepatobiliary: Rounded hypervascular foci in inferior central liver and segment 3 not seen on most recent portal venous and delayed phase abdominal CT, possibly shunts. These measure up to 16 mm. Patient is under surveillance for lymphoma.No evidence of biliary obstruction or stone. Pancreas: Unremarkable. Spleen: Unremarkable. Adrenals/Urinary Tract: Negative adrenals. Essentially resolved left hydronephrosis. Left ureteral stent with retention loop loosely formed at the level of the upper ureter, alignment similar to 04/03/2019 scout CT. Unremarkable bladder. Stomach/Bowel: Moderate stool volume without colonic over distension. No bowel wall thickening Lymphatic: Retro crural and retroperitoneal lymphadenopathy which has decreased in bulk since 04/28/2019. nodes remain homogeneous. Reproductive:Hysterectomy Other: No ascites or pneumoperitoneum. Musculoskeletal: No acute or aggressive finding. Chronic rim enhancing fluid collection in the left subcutaneous gluteal region. Review of the MIP images confirms the above findings. IMPRESSION: 1. No evidence of acute aortic syndrome. No significant atheromatous changes or stenosis.  2. Lymphoma with interval positive treatment response. 3. Left ureteral stent with loosely formed upper retention loop at the level of the upper ureter, unchanged. 4. 2 hypervascular foci in the liver not seen on most recent PET-CT and abdominal CT, possible shunts. Attention on follow-up. Electronically Signed   By: Monte Fantasia M.D.   On: 04/23/2019 05:03   Ir Imaging Guided Port Insertion  Result Date: 04/06/2019 INDICATION: 63 year old female with a history of lymphoma EXAM: IMPLANTED PORT A CATH PLACEMENT WITH ULTRASOUND AND FLUOROSCOPIC GUIDANCE MEDICATIONS: 2 g Ancef; The antibiotic was administered within an appropriate time interval prior to skin puncture. ANESTHESIA/SEDATION: Moderate (conscious) sedation was employed during this procedure. A total of Versed 2.0 mg and Fentanyl 100 mcg was administered intravenously. Moderate Sedation Time: 30 minutes. The patient's level of consciousness and vital signs were monitored continuously by radiology nursing throughout the procedure under my direct supervision. FLUOROSCOPY TIME:  0 minutes, 6 seconds (2 mGy) COMPLICATIONS: None PROCEDURE: The procedure, risks, benefits, and alternatives were explained to the patient. Questions regarding the procedure were encouraged and answered. The patient understands and consents to the procedure. Ultrasound survey was performed with images stored and sent to PACs. The right neck and chest was prepped with chlorhexidine, and draped in the usual sterile fashion using maximum barrier technique (cap and mask, sterile gown, sterile gloves, large sterile sheet, hand hygiene and cutaneous antiseptic). Antibiotic prophylaxis was provided with 2.0g Ancef administered IV one hour prior to skin incision. Local anesthesia was attained by infiltration with 1% lidocaine without epinephrine. Ultrasound demonstrated patency of the right internal jugular vein, and this was documented with an image. Under real-time ultrasound guidance,  this vein was accessed with a 21 gauge micropuncture needle and image documentation was performed. A small dermatotomy was made at the access site with an 11 scalpel. A 0.018" wire was advanced into the SVC and used to estimate the length of the internal catheter. The access needle exchanged for a 87F micropuncture vascular sheath. The 0.018" wire was then removed and a 0.035" wire advanced into the IVC. An appropriate location for the subcutaneous reservoir was selected below the clavicle and an incision was made through the skin and underlying soft tissues.  The subcutaneous tissues were then dissected using a combination of blunt and sharp surgical technique and a pocket was formed. A single lumen power injectable portacatheter was then tunneled through the subcutaneous tissues from the pocket to the dermatotomy and the port reservoir placed within the subcutaneous pocket. The venous access site was then serially dilated and a peel away vascular sheath placed over the wire. The wire was removed and the port catheter advanced into position under fluoroscopic guidance. The catheter tip is positioned in the cavoatrial junction. This was documented with a spot image. The portacatheter was then tested and found to flush and aspirate well. The port was flushed with saline followed by 100 units/mL heparinized saline. The pocket was then closed in two layers using first subdermal inverted interrupted absorbable sutures followed by a running subcuticular suture. The epidermis was then sealed with Dermabond. The dermatotomy at the venous access site was also seal with Dermabond. Patient tolerated the procedure well and remained hemodynamically stable throughout. No complications encountered and no significant blood loss encountered IMPRESSION: Status post right IJ port catheter placement. Catheter ready for use. Signed, Dulcy Fanny. Dellia Nims, RPVI [facility]  Electronically Signed   By: Corrie Mckusick D.O.   On: 04/06/2019 14:08    Assessment and plan- Patient is a 63 y.o. female with newly diagnosed grade 3A stage III follicular lymphoma status post 1 dose of bendamustine and Rituxan admitted for acute low back pain  Suspect acute low back pain is secondary to Neulasta since there is no other discernible etiology based on scans.  Her white count is high at 60 also probably secondary to Neulasta which she received on 04/21/2019.  Patient will be undergoing stent removal by urology today.  Overall if patient is hemodynamically stable and pain is well controlled she can be discharged on antibiotics for UTI and oral pain medications.  I will have a short-term follow-up with the patient and consider giving her as needed pain medicines for her next cycle of chemotherapy   Thank you for this kind referral and the opportunity to participate in the care of this patient   Visit Diagnosis 1. Intractable low back pain   2. Ureteral stent displacement, subsequent encounter   3. Elevated lactic acid level   4. Leukocytosis, unspecified type   5. Back pain     Dr. Randa Evens, MD, MPH [facility] [phone] 04/23/2019  12:09 PM

## 2019-04-23 NOTE — ED Notes (Addendum)
Pt given phone to talk to husband.

## 2019-04-23 NOTE — Progress Notes (Addendum)
I will see patient later today. Patient did receive neulasta on 6/23. Neutrophilia likely secondary to neulasta.   Please check ldh, uric acid and hydrate the patient  Dr. Randa Evens, MD, MPH [facility] Pager- [phone] 04/23/2019 8:23 AM

## 2019-04-24 LAB — CBC WITH DIFFERENTIAL/PLATELET
Abs Immature Granulocytes: 4.15 10*3/uL — ABNORMAL HIGH (ref 0.00–0.07)
Basophils Absolute: 0 10*3/uL (ref 0.0–0.1)
Basophils Relative: 0 %
Eosinophils Absolute: 0 10*3/uL (ref 0.0–0.5)
Eosinophils Relative: 0 %
HCT: 36.9 % (ref 36.0–46.0)
Hemoglobin: 11.8 g/dL — ABNORMAL LOW (ref 12.0–15.0)
Immature Granulocytes: 8 %
Lymphocytes Relative: 2 %
Lymphs Abs: 1.2 10*3/uL (ref 0.7–4.0)
MCH: 28.5 pg (ref 26.0–34.0)
MCHC: 32 g/dL (ref 30.0–36.0)
MCV: 89.1 fL (ref 80.0–100.0)
Monocytes Absolute: 2.1 10*3/uL — ABNORMAL HIGH (ref 0.1–1.0)
Monocytes Relative: 4 %
Neutro Abs: 42.5 10*3/uL — ABNORMAL HIGH (ref 1.7–7.7)
Neutrophils Relative %: 86 %
Platelets: 228 10*3/uL (ref 150–400)
RBC: 4.14 MIL/uL (ref 3.87–5.11)
RDW: 15.3 % (ref 11.5–15.5)
Smear Review: NORMAL
WBC: 49.9 10*3/uL — ABNORMAL HIGH (ref 4.0–10.5)
nRBC: 0 % (ref 0.0–0.2)

## 2019-04-24 LAB — URINE CULTURE: Special Requests: NORMAL

## 2019-04-24 MED ORDER — HYDROCODONE-ACETAMINOPHEN 5-325 MG PO TABS: 1.0000 | ORAL_TABLET | Freq: Four times a day (QID) | ORAL | 0 refills | Status: DC | PRN

## 2019-04-24 MED ORDER — CEFUROXIME AXETIL 250 MG PO TABS: 250.0000 mg | ORAL_TABLET | Freq: Two times a day (BID) | ORAL | 0 refills | Status: AC

## 2019-04-24 NOTE — Anesthesia Postprocedure Evaluation (Signed)
Anesthesia Post Note  Patient: Andrea Lloyd  Procedure(s) Performed: CYSTOSCOPY WITH LEFT STENT EXCHANGE (Left )  Patient location during evaluation: PACU Anesthesia Type: General Level of consciousness: awake and alert Pain management: pain level controlled Vital Signs Assessment: post-procedure vital signs reviewed and stable Respiratory status: spontaneous breathing, nonlabored ventilation and respiratory function stable Cardiovascular status: blood pressure returned to baseline and stable Postop Assessment: no apparent nausea or vomiting Anesthetic complications: no     Last Vitals:  Vitals:   04/23/19 2011 04/24/19 0606  BP: 126/75 119/74  Pulse: 90 64  Resp: 20   Temp: 36.6 C 36.9 C  SpO2: 95% 97%    Last Pain:  Vitals:   04/24/19 0659  TempSrc:   PainSc: [place]

## 2019-04-28 ENCOUNTER — Inpatient Hospital Stay: Payer: BLUE CROSS/BLUE SHIELD

## 2019-04-28 ENCOUNTER — Inpatient Hospital Stay (HOSPITAL_BASED_OUTPATIENT_CLINIC_OR_DEPARTMENT_OTHER): Payer: BLUE CROSS/BLUE SHIELD | Admitting: Oncology

## 2019-04-28 ENCOUNTER — Ambulatory Visit: Payer: Self-pay | Admitting: Urology

## 2019-04-28 ENCOUNTER — Other Ambulatory Visit: Payer: Self-pay

## 2019-04-28 ENCOUNTER — Encounter: Payer: Self-pay | Admitting: Oncology

## 2019-04-28 VITALS — BP 117/78 | HR 108 | Temp 97.5°F | Resp 18 | Wt 183.6 lb

## 2019-04-28 DIAGNOSIS — C8203 Follicular lymphoma grade I, intra-abdominal lymph nodes: Secondary | ICD-10-CM

## 2019-04-28 DIAGNOSIS — R590 Localized enlarged lymph nodes: Secondary | ICD-10-CM

## 2019-04-28 DIAGNOSIS — Z90710 Acquired absence of both cervix and uterus: Secondary | ICD-10-CM

## 2019-04-28 DIAGNOSIS — Z95828 Presence of other vascular implants and grafts: Secondary | ICD-10-CM

## 2019-04-28 DIAGNOSIS — M545 Low back pain, unspecified: Secondary | ICD-10-CM

## 2019-04-28 DIAGNOSIS — Z5111 Encounter for antineoplastic chemotherapy: Secondary | ICD-10-CM | POA: Diagnosis not present

## 2019-04-28 DIAGNOSIS — Z7189 Other specified counseling: Secondary | ICD-10-CM

## 2019-04-28 DIAGNOSIS — C8233 Follicular lymphoma grade IIIa, intra-abdominal lymph nodes: Secondary | ICD-10-CM

## 2019-04-28 DIAGNOSIS — C8238 Follicular lymphoma grade IIIa, lymph nodes of multiple sites: Secondary | ICD-10-CM

## 2019-04-28 DIAGNOSIS — N1330 Unspecified hydronephrosis: Secondary | ICD-10-CM | POA: Diagnosis not present

## 2019-04-28 DIAGNOSIS — Z5112 Encounter for antineoplastic immunotherapy: Secondary | ICD-10-CM | POA: Diagnosis present

## 2019-04-28 DIAGNOSIS — Z5189 Encounter for other specified aftercare: Secondary | ICD-10-CM | POA: Diagnosis not present

## 2019-04-28 LAB — CBC WITH DIFFERENTIAL/PLATELET
Abs Immature Granulocytes: 0.61 10*3/uL — ABNORMAL HIGH (ref 0.00–0.07)
Basophils Absolute: 0.1 10*3/uL (ref 0.0–0.1)
Basophils Relative: 1 %
Eosinophils Absolute: 0.3 10*3/uL (ref 0.0–0.5)
Eosinophils Relative: 2 %
HCT: 43.8 % (ref 36.0–46.0)
Hemoglobin: 14 g/dL (ref 12.0–15.0)
Immature Granulocytes: 4 %
Lymphocytes Relative: 6 %
Lymphs Abs: 0.8 10*3/uL (ref 0.7–4.0)
MCH: 28.2 pg (ref 26.0–34.0)
MCHC: 32 g/dL (ref 30.0–36.0)
MCV: 88.1 fL (ref 80.0–100.0)
Monocytes Absolute: 1.2 10*3/uL — ABNORMAL HIGH (ref 0.1–1.0)
Monocytes Relative: 9 %
Neutro Abs: 11.2 10*3/uL — ABNORMAL HIGH (ref 1.7–7.7)
Neutrophils Relative %: 78 %
Platelets: 213 10*3/uL (ref 150–400)
RBC: 4.97 MIL/uL (ref 3.87–5.11)
RDW: 15.4 % (ref 11.5–15.5)
WBC: 14.2 10*3/uL — ABNORMAL HIGH (ref 4.0–10.5)
nRBC: 0 % (ref 0.0–0.2)

## 2019-04-28 LAB — COMPREHENSIVE METABOLIC PANEL
ALT: 34 U/L (ref 0–44)
AST: 26 U/L (ref 15–41)
Albumin: 4.3 g/dL (ref 3.5–5.0)
Alkaline Phosphatase: 142 U/L — ABNORMAL HIGH (ref 38–126)
Anion gap: 11 (ref 5–15)
BUN: 14 mg/dL (ref 8–23)
CO2: 27 mmol/L (ref 22–32)
Calcium: 9.2 mg/dL (ref 8.9–10.3)
Chloride: 100 mmol/L (ref 98–111)
Creatinine, Ser: 0.71 mg/dL (ref 0.44–1.00)
GFR calc Af Amer: >60 mL/min (ref >60)
GFR calc non Af Amer: >60 mL/min (ref >60)
Glucose, Bld: 102 mg/dL — ABNORMAL HIGH (ref 70–99)
Potassium: 4.1 mmol/L (ref 3.5–5.1)
Sodium: 138 mmol/L (ref 135–145)
Total Bilirubin: 1.1 mg/dL (ref 0.3–1.2)
Total Protein: 7 g/dL (ref 6.5–8.1)

## 2019-04-28 LAB — LACTATE DEHYDROGENASE: LDH: 129 U/L (ref 98–192)

## 2019-04-28 LAB — URIC ACID: Uric Acid, Serum: 3.5 mg/dL (ref 2.5–7.1)

## 2019-04-28 MED ORDER — HEPARIN SOD (PORK) LOCK FLUSH 100 UNIT/ML IV SOLN
500.0000 [IU] | Freq: Once | INTRAVENOUS | Status: AC
  Administered 2019-04-28: 500 [IU] via INTRAVENOUS
  Filled 2019-04-28: qty 5

## 2019-04-28 MED ORDER — CYCLOBENZAPRINE HCL 10 MG PO TABS: 10.0000 mg | ORAL_TABLET | Freq: Three times a day (TID) | ORAL | 0 refills | Status: DC | PRN

## 2019-04-28 MED ORDER — SODIUM CHLORIDE 0.9% FLUSH
10.0000 mL | Freq: Once | INTRAVENOUS | Status: AC
  Administered 2019-04-28: 10 mL via INTRAVENOUS
  Filled 2019-04-28: qty 10

## 2019-04-28 MED ORDER — OXYCODONE HCL 10 MG PO TABS: 10.0000 mg | ORAL_TABLET | ORAL | 0 refills | Status: DC | PRN

## 2019-04-28 NOTE — Progress Notes (Signed)
Patient here for follow up. Patient complains of low back pain that radiates to neck and sometimes chest.

## 2019-04-29 ENCOUNTER — Telehealth (INDEPENDENT_AMBULATORY_CARE_PROVIDER_SITE_OTHER): Payer: Self-pay | Admitting: Surgery

## 2019-04-29 DIAGNOSIS — Z09 Encounter for follow-up examination after completed treatment for conditions other than malignant neoplasm: Secondary | ICD-10-CM

## 2019-04-29 NOTE — Progress Notes (Signed)
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   2. This was followed by a CT of the abdomen which showed: Multiple enlarged abdominal and left common iliac nodes. Findings are worrisome for either metastatic adenopathy from a abdominal primary versus lymphoproliferative disorder. Consider further investigation with PET-CT and tissue sampling.  3. PET/CT scan from 01/08/2017 showed:Hypermetabolic upper abdominal and retroperitoneal lymph nodes, measuring up to 1.9 cm short axis, worrisome for nodal metastases versus lymphoma.  Hypermetabolic 7 mm short axis node at the left thoracic inlet, worrisome for lymphatic spread via the thoracic duct.  4. Tumor markers including CEA and CA-19-9 was normal at 1 and 8 respectively.  5. She is otherwise doing well and denies any complaints of unintentional weight loss, loss of appetite, fevers or chills or drenching night sweats. Her abdominal pain is mostly associated with eating and sometimes comes on a day later. It is mainly in her left lower quadrant and is a dull aching pain but sometimes she seems to have pain in the epigastrium and right upper quadrant  6. Patient underwent IR guided retroperitoneal LN biopsy which showed : DIAGNOSIS:  A. LYMPH NODE, LEFT RETROPERITONEAL; CT-GUIDED BIOPSY:  - FOLLICULAR LYMPHOMA, GRADE  1-2.  7. Bone marrow biopsy negative for lymphoma. Normal karyotype and cytogenetics  8.  Patient underwent excisional lymph node biopsy of the left supraclavicular lymph node. Pathology showed Grdae 3A follicular lymphoma. No diffuse pattern was seen    Interval history- Patient was hospitalized a day after she received neulasta for worsening spasmodic low back pain radiating around anteriorly to her rib cage and upward. CT abdomen/ ct lumbar spine was unremarkable. She also underwent ureteral stent exchange by Dr. Bernardo Heater  She is currently using 2 hydrocodone pills 3-4 times for her pain which continues to bother her. She reports that these ttacks come on suddenly and she feels like someone is squeezing her hard around her back and rib cage. It sometimes wakes her up from her sleep  ECOG PS- 0-1 Pain scale- 5 Opioid associated constipation- no  Review of systems- Review of Systems  Constitutional: Negative for chills, fever, malaise/fatigue and weight loss.  HENT: Negative for congestion, ear discharge and nosebleeds.   Eyes: Negative for blurred vision.  Respiratory: Negative for cough, hemoptysis, sputum production, shortness of breath and wheezing.   Cardiovascular: Negative for chest pain, palpitations, orthopnea and claudication.  Gastrointestinal: Negative for abdominal pain, blood in stool, constipation, diarrhea, heartburn, melena, nausea and vomiting.  Genitourinary: Negative for dysuria, flank pain, frequency, hematuria and urgency.  Musculoskeletal: Positive for back pain. Negative for joint pain and myalgias.  Skin: Negative for rash.  Neurological: Negative for dizziness, tingling, focal weakness, seizures, weakness and headaches.  Endo/Heme/Allergies: Does not bruise/bleed easily.  Psychiatric/Behavioral: Negative for depression and suicidal ideas. The patient does not have insomnia.       Allergies  Allergen Reactions   Adhesive [Tape] Rash  Bandaids      Past Medical History:  Diagnosis Date   Asthma    Family history of adverse reaction to anesthesia    Father - slow to wake   GERD (gastroesophageal reflux disease)    Gravida 4 para 4    Lymphoma ([place])      Past Surgical History:  Procedure Laterality Date   ABDOMINAL HYSTERECTOMY     06 01 2013   BLADDER SURGERY     COLONOSCOPY WITH PROPOFOL N/A 02/25/2017   Procedure: COLONOSCOPY WITH PROPOFOL;  Surgeon: Lucilla Lame, MD;  Location: [place];  Service: Endoscopy;  Laterality: N/A;   CYSTOSCOPY W/ URETERAL STENT PLACEMENT Left 03/28/2019   Procedure: CYSTOSCOPY WITH RETROGRADE PYELOGRAM/URETERAL STENT PLACEMENT;  Surgeon: Abbie Sons, MD;  Location: [facility];  Service: Urology;  Laterality: Left;   CYSTOSCOPY WITH STENT PLACEMENT Left 04/23/2019   Procedure: CYSTOSCOPY WITH LEFT STENT EXCHANGE;  Surgeon: Abbie Sons, MD;  Location: [facility];  Service: Urology;  Laterality: Left;   IR IMAGING GUIDED PORT INSERTION  04/06/2019   MASS BIOPSY Left 04/10/2019   Procedure: NECK MASS BIOPSY LEFT;  Surgeon: Jules Husbands, MD;  Location: [facility];  Service: General;  Laterality: Left;   POLYPECTOMY  02/25/2017   Procedure: POLYPECTOMY;  Surgeon: Lucilla Lame, MD;  Location: [place];  Service: Endoscopy;;   PORT A CATH INJECTION ([place] HX)     ROTATOR CUFF REPAIR Right    TONSILLECTOMY  1972    Social History   Socioeconomic History   Marital status: Married    Spouse name: Not on file   Number of children: Not on file   Years of education: Not on file   Highest education level: Not on file  Occupational History   Not on file  Social Needs   Financial resource strain: Not on file   Food insecurity    Worry: Not on file    Inability: Not on file   Transportation needs    Medical: Not on file    Non-medical: Not on file  Tobacco Use   Smoking status: Never Smoker   Smokeless tobacco: Never Used  Substance and Sexual  Activity   Alcohol use: No   Drug use: No   Sexual activity: Yes  Lifestyle   Physical activity    Days per week: Not on file    Minutes per session: Not on file   Stress: Not on file  Relationships   Social connections    Talks on phone: Not on file    Gets together: Not on file    Attends religious service: Not on file    Active member of club or organization: Not on file    Attends meetings of clubs or organizations: Not on file    Relationship status: Not on file   Intimate partner violence    Fear of current or ex partner: Not on file    Emotionally abused: Not on file    Physically abused: Not on file    Forced sexual activity: Not on file  Other Topics Concern   Not on file  Social History Narrative   Not on file    Family History  Problem Relation Age of Onset   Alcohol abuse Maternal Grandfather    Healthy Mother    Cancer Father    Healthy Sister    Healthy Brother    Breast cancer Maternal Aunt    Hyperlipidemia Neg Hx  Heart disease Neg Hx    Diabetes Neg Hx      Current Outpatient Medications:    acyclovir (ZOVIRAX) 400 MG tablet, Take 1 tablet (400 mg total) by mouth daily., Disp: 30 tablet, Rfl: 3   albuterol (PROVENTIL HFA) 108 (90 Base) MCG/ACT inhaler, Inhale 2 puffs into the lungs every 6 (six) hours as needed for wheezing or shortness of breath. , Disp: , Rfl:    allopurinol (ZYLOPRIM) 300 MG tablet, Take 1 tablet (300 mg total) by mouth 2 (two) times daily., Disp: 60 tablet, Rfl: 2   Cholecalciferol (VITAMIN D3 PO), Take 20,000 mcg by mouth daily., Disp: , Rfl:    HYDROcodone-acetaminophen (NORCO/VICODIN) 5-325 MG tablet, Take 1-2 tablets by mouth every 6 (six) hours as needed for moderate pain., Disp: 30 tablet, Rfl: 0   lidocaine-prilocaine (EMLA) cream, Apply to affected area once, Disp: 30 g, Rfl: 3   LORazepam (ATIVAN) 0.5 MG tablet, Take 1 tablet (0.5 mg total) by mouth every 6 (six) hours as needed (Nausea or  vomiting)., Disp: 30 tablet, Rfl: 0   Menaquinone-7 (VITAMIN K2 PO), Take 200 mcg by mouth daily., Disp: , Rfl:    Multiple Vitamin (MULTI-VITAMIN DAILY PO), Take 1 tablet by mouth daily. , Disp: , Rfl:    ondansetron (ZOFRAN) 8 MG tablet, Take 1 tablet (8 mg total) by mouth 2 (two) times daily as needed for refractory nausea / vomiting. Start on day 2 after bendamustine chemotherapy., Disp: 30 tablet, Rfl: 1   oxybutynin (DITROPAN) 5 MG tablet, Take 1 tablet (5 mg total) by mouth every 8 (eight) hours as needed for bladder spasms., Disp: 30 tablet, Rfl: 0   polyethylene glycol (MIRALAX / GLYCOLAX) 17 g packet, Take 17 g by mouth daily., Disp: , Rfl:    Probiotic Product (PROBIOTIC PO), Take 1 capsule by mouth daily., Disp: , Rfl:    prochlorperazine (COMPAZINE) 10 MG tablet, Take 1 tablet (10 mg total) by mouth every 6 (six) hours as needed (Nausea or vomiting)., Disp: 30 tablet, Rfl: 1   cyclobenzaprine (FLEXERIL) 10 MG tablet, Take 1 tablet (10 mg total) by mouth 3 (three) times daily as needed for muscle spasms., Disp: 60 tablet, Rfl: 0   Oxycodone HCl 10 MG TABS, Take 1 tablet (10 mg total) by mouth every 4 (four) hours as needed., Disp: 120 tablet, Rfl: 0  Physical exam:  Vitals:   04/28/19 1113  BP: 117/78  Pulse: (!) 108  Resp: 18  Temp: (!) 97.5 F (36.4 C)  Weight: 183 lb 9.6 oz (83.3 kg)   Physical Exam HENT:     Head: Normocephalic and atraumatic.  Eyes:     Pupils: Pupils are equal, round, and reactive to light.  Neck:     Musculoskeletal: Normal range of motion.  Cardiovascular:     Rate and Rhythm: Normal rate and regular rhythm.     Heart sounds: Normal heart sounds.  Pulmonary:     Effort: Pulmonary effort is normal.     Breath sounds: Normal breath sounds.  Abdominal:     General: Bowel sounds are normal.     Palpations: Abdomen is soft.  Musculoskeletal:     Comments: No TTP over spine.   Lymphadenopathy:     Comments: No palpable supraclavicular  adenopathy  Skin:    General: Skin is warm and dry.  Neurological:     General: No focal deficit present.     Mental Status: She is alert and oriented to person, place,  and time.      CMP Latest Ref Rng & Units 04/28/2019  Glucose 70 - 99 mg/dL 102(H)  BUN 8 - 23 mg/dL 14  Creatinine 0.44 - 1.00 mg/dL 0.71  Sodium 135 - 145 mmol/L 138  Potassium 3.5 - 5.1 mmol/L 4.1  Chloride 98 - 111 mmol/L 100  CO2 22 - 32 mmol/L 27  Calcium 8.9 - 10.3 mg/dL 9.2  Total Protein 6.5 - 8.1 g/dL 7.0  Total Bilirubin 0.3 - 1.2 mg/dL 1.1  Alkaline Phos 38 - 126 U/L 142(H)  AST 15 - 41 U/L 26  ALT 0 - 44 U/L 34   CBC Latest Ref Rng & Units 04/28/2019  WBC 4.0 - 10.5 K/uL 14.2(H)  Hemoglobin 12.0 - 15.0 g/dL 14.0  Hematocrit 36.0 - 46.0 % 43.8  Platelets 150 - 400 K/uL 213    No images are attached to the encounter.  Nm Pet Image Restag (ps) Skull Base To Thigh  Result Date: 04/02/2019 CLINICAL DATA:  Subsequent treatment strategy for lymphoma. EXAM: NUCLEAR MEDICINE PET SKULL BASE TO THIGH TECHNIQUE: 10.1 mCi F-18 FDG was injected intravenously. Full-ring PET imaging was performed from the skull base to thigh after the radiotracer. CT data was obtained and used for attenuation correction and anatomic localization. Fasting blood glucose: 95 mg/dl COMPARISON:  PET-CT 01/08/2017. and CT AP from 03/28/2019 FINDINGS: Mediastinal blood pool activity: SUV max 2.2 Liver activity: SUV max 3.7 NECK: Enlarged left supraclavicular lymph node measures 1.9 cm and has an SUV max of 17.05, Deauville criteria 5. Previously this measured 0.7 cm with an SUV max of 4.1. Incidental CT findings: none CHEST: No hypermetabolic mediastinal or hilar nodes. No suspicious pulmonary nodules on the CT scan. Incidental CT findings: Small left pleural effusion.  New. ABDOMEN/PELVIS: No abnormal uptake within the liver, pancreas, or spleen. The spleen measures 10.5 cm in length with SUV max similar to liver activity. Extensive, bulky  retroperitoneal lymph nodes identified. Dominant retroperitoneal mass measures 12.3 by 8.7 by 12.3 cm and has an SUV max of 23.9, Deauville criteria 5. On previous exam the largest retroperitoneal lymph node had a short axis of 1.9 cm and an SUV max of 6.4. FDG avid enlarged retrocrural lymph nodes noted measuring 4.0 x 2.5 cm within SUV max of 18.5, Deauville criteria 5. previously this measured 0.9 cm within SUV max of 3.19. Incidental CT findings: Left-sided hydronephrosis is identified with newly placed ureteral stent. SKELETON: No focal hypermetabolic activity to suggest skeletal metastasis. Incidental CT findings: There is a mass within the left gluteal region which measures 7.5 by 6.1 cm. This is peripherally FDG avid suggesting underlying inflammatory or infectious process. IMPRESSION: 1. Examination compatible with recurrent progressive lymphoma, Deauville criteria 5. 2. Enlarged left supraclavicular, retrocrural and retroperitoneal adenopathy identified. Sites of disease demonstrate significant increase in size and degree of FDG uptake compared with previous. 3. Left-sided hydronephrosis status post ureteral stent placement 4. Persistent left gluteal region subcutaneous fluid collection with peripheral FDG uptake compatible with an inflammatory or infectious process. 5. Small left pleural effusion.  New. Electronically Signed   By: Kerby Moors M.D.   On: 04/02/2019 14:26   Ct L-spine No Charge  Result Date: 04/23/2019 CLINICAL DATA:  Severe back pain. Known lymphoma. History of left ureteral stent. EXAM: CT LUMBAR SPINE WITHOUT CONTRAST TECHNIQUE: Multidetector CT imaging of the lumbar spine was performed without intravenous contrast administration. Multiplanar CT image reconstructions were also generated. COMPARISON:  None. FINDINGS: Segmentation: Normal Alignment: Normal Vertebrae: No  bone lesions or fractures are identified. Paraspinal and other soft tissues: Again noted is bulk E left-sided  predominant retroperitoneal lymphadenopathy. The major vascular structures appear normal. There is a left-sided ureteral stent in the upper ureter. Mild left-sided hydronephrosis. Disc levels: No large disc protrusions, spinal or foraminal stenosis is demonstrated. IMPRESSION: Normal alignment of the lumbar vertebral bodies and no acute bony findings or worrisome bone lesions. The spinal canal is generous in is no significant canal or foraminal stenosis. Stable retroperitoneal lymphadenopathy. Left ureteral stent in the upper left ureter.  Mild hydronephrosis. Electronically Signed   By: Marijo Sanes M.D.   On: 04/23/2019 08:59   Ct Biopsy  Result Date: 04/03/2019 CLINICAL DATA:  History of grade 2 follicular lymphoma of retroperitoneal lymph nodes and status post prior lymph node biopsy on 01/21/2017. The patient now presents with significant progressive lymph node enlargement and PET scan evidence of progressive disease. Repeat lymph node biopsy now necessary to restage lymphoma. EXAM: CT GUIDED CORE BIOPSY OF LEFT PARA-AORTIC RETROPERITONEAL LYMPHADENOPATHY ANESTHESIA/SEDATION: 4.0 mg IV Versed; 100 mcg IV Fentanyl Total Moderate Sedation Time:  45 minutes. The patient's level of consciousness and physiologic status were continuously monitored during the procedure by Radiology nursing. Sedation represents total sedation administered for combined CT-guided bone marrow and lymph node biopsy procedures. PROCEDURE: The procedure risks, benefits, and alternatives were explained to the patient. Questions regarding the procedure were encouraged and answered. The patient understands and consents to the procedure. A time-out was performed prior to initiating the procedure. CT was performed through the lower abdomen in a prone position. The left translumbar region was prepped with chlorhexidine in a sterile fashion, and a sterile drape was applied covering the operative field. A sterile gown and sterile gloves were  used for the procedure. Local anesthesia was provided with 1% Lidocaine. Under CT guidance, a 17 gauge needle was advanced from a left trans lumbar approach to the level of enlarged para-aortic lymphadenopathy. Coaxial 18 gauge core biopsy samples were obtained. A total of 6 core biopsy samples were submitted on saline soaked Telfa gauze. COMPLICATIONS: None FINDINGS: Largest area confluent lymphadenopathy measuring approximately 10 cm in maximum diameter was targeted in the left para-aortic region medial to a displaced ureteral stent. Solid tissue was obtained. IMPRESSION: CT-guided core biopsy performed of large lymph node mass in the left para-aortic retroperitoneum. Electronically Signed   By: Aletta Edouard M.D.   On: 04/03/2019 13:29   Ct Bone Marrow Biopsy & Aspiration  Result Date: 04/03/2019 CLINICAL DATA:  Follicular lymphoma with CT and PET scan evidence progressive lymph node disease and need for bone marrow biopsy for staging. EXAM: CT GUIDED BONE MARROW ASPIRATION AND BIOPSY ANESTHESIA/SEDATION: Versed 4.0 mg IV, Fentanyl 100 mcg IV Total Moderate Sedation Time:   45 minutes. The patient's level of consciousness and physiologic status were continuously monitored during the procedure by Radiology nursing. Sedation and sedation time reflect sedation given for combined bone marrow and lymph node biopsy procedures. PROCEDURE: The procedure risks, benefits, and alternatives were explained to the patient. Questions regarding the procedure were encouraged and answered. The patient understands and consents to the procedure. A time out was performed prior to initiating the procedure. The right gluteal region was prepped with chlorhexidine. Sterile gown and sterile gloves were used for the procedure. Local anesthesia was provided with 1% Lidocaine. Under CT guidance, an 11 gauge On Control bone cutting needle was advanced from a posterior approach into the right iliac bone. Needle positioning was confirmed  with CT. Initial non heparinized and heparinized aspirate samples were obtained of bone marrow. Core biopsy was performed via the On Control drill needle. COMPLICATIONS: None FINDINGS: Inspection of initial aspirate did reveal visible particles. Intact core biopsy sample was obtained. IMPRESSION: CT guided bone marrow biopsy of right posterior iliac bone with both aspirate and core samples obtained. Electronically Signed   By: Aletta Edouard M.D.   On: 04/03/2019 13:26   Dg C-arm 1-60 Min-no Report  Result Date: 04/23/2019 Fluoroscopy was utilized by the requesting physician.  No radiographic interpretation.   Ct Angio Chest/abd/pel For Dissection W And/or W/wo  Result Date: 04/23/2019 CLINICAL DATA:  Lower back pain.  Chest pain EXAM: CT ANGIOGRAPHY CHEST, ABDOMEN AND PELVIS TECHNIQUE: Multidetector CT imaging through the chest, abdomen and pelvis was performed using the standard protocol during bolus administration of intravenous contrast. Multiplanar reconstructed images and MIPs were obtained and reviewed to evaluate the vascular anatomy. CONTRAST:  151m ISOVUE-370 IOPAMIDOL (ISOVUE-370) INJECTION 76% COMPARISON:  PET CT 04/02/2019 FINDINGS: CTA CHEST FINDINGS Cardiovascular: No intramural hematoma the thoracic aorta on noncontrast phase. Negative for aortic dissection or aneurysm. No noted atheromatous changes. No pulmonary artery filling defect. Mediastinum/Nodes: Negative for thoracic adenopathy or mass Lungs/Pleura: There is no edema, consolidation, effusion, or pneumothorax. Musculoskeletal: No acute or aggressive finding Review of the MIP images confirms the above findings. CTA ABDOMEN AND PELVIS FINDINGS VASCULAR Aorta: Minimal atheromatous plaque.  No dissection or aneurysm. Celiac: Vessels are smooth and widely patent SMA: Vessels are smooth and widely patent Renals: Accessory left renal artery. Vessels are smooth and widely patent IMA: Patent Inflow: Widely patent Veins: Negative in the  arterial phase Review of the MIP images confirms the above findings. NON-VASCULAR Hepatobiliary: Rounded hypervascular foci in inferior central liver and segment 3 not seen on most recent portal venous and delayed phase abdominal CT, possibly shunts. These measure up to 16 mm. Patient is under surveillance for lymphoma.No evidence of biliary obstruction or stone. Pancreas: Unremarkable. Spleen: Unremarkable. Adrenals/Urinary Tract: Negative adrenals. Essentially resolved left hydronephrosis. Left ureteral stent with retention loop loosely formed at the level of the upper ureter, alignment similar to 04/03/2019 scout CT. Unremarkable bladder. Stomach/Bowel: Moderate stool volume without colonic over distension. No bowel wall thickening Lymphatic: Retro crural and retroperitoneal lymphadenopathy which has decreased in bulk since 04/28/2019. nodes remain homogeneous. Reproductive:Hysterectomy Other: No ascites or pneumoperitoneum. Musculoskeletal: No acute or aggressive finding. Chronic rim enhancing fluid collection in the left subcutaneous gluteal region. Review of the MIP images confirms the above findings. IMPRESSION: 1. No evidence of acute aortic syndrome. No significant atheromatous changes or stenosis. 2. Lymphoma with interval positive treatment response. 3. Left ureteral stent with loosely formed upper retention loop at the level of the upper ureter, unchanged. 4. 2 hypervascular foci in the liver not seen on most recent PET-CT and abdominal CT, possible shunts. Attention on follow-up. Electronically Signed   By: JMonte FantasiaM.D.   On: 04/23/2019 05:03   Ir Imaging Guided Port Insertion  Result Date: 04/06/2019 INDICATION: 63year old female with a history of lymphoma EXAM: IMPLANTED PORT A CATH PLACEMENT WITH ULTRASOUND AND FLUOROSCOPIC GUIDANCE MEDICATIONS: 2 g Ancef; The antibiotic was administered within an appropriate time interval prior to skin puncture. ANESTHESIA/SEDATION: Moderate (conscious)  sedation was employed during this procedure. A total of Versed 2.0 mg and Fentanyl 100 mcg was administered intravenously. Moderate Sedation Time: 30 minutes. The patient's level of consciousness and vital signs were monitored continuously by radiology nursing throughout the procedure  under my direct supervision. FLUOROSCOPY TIME:  0 minutes, 6 seconds (2 mGy) COMPLICATIONS: None PROCEDURE: The procedure, risks, benefits, and alternatives were explained to the patient. Questions regarding the procedure were encouraged and answered. The patient understands and consents to the procedure. Ultrasound survey was performed with images stored and sent to PACs. The right neck and chest was prepped with chlorhexidine, and draped in the usual sterile fashion using maximum barrier technique (cap and mask, sterile gown, sterile gloves, large sterile sheet, hand hygiene and cutaneous antiseptic). Antibiotic prophylaxis was provided with 2.0g Ancef administered IV one hour prior to skin incision. Local anesthesia was attained by infiltration with 1% lidocaine without epinephrine. Ultrasound demonstrated patency of the right internal jugular vein, and this was documented with an image. Under real-time ultrasound guidance, this vein was accessed with a 21 gauge micropuncture needle and image documentation was performed. A small dermatotomy was made at the access site with an 11 scalpel. A 0.018" wire was advanced into the SVC and used to estimate the length of the internal catheter. The access needle exchanged for a 18F micropuncture vascular sheath. The 0.018" wire was then removed and a 0.035" wire advanced into the IVC. An appropriate location for the subcutaneous reservoir was selected below the clavicle and an incision was made through the skin and underlying soft tissues. The subcutaneous tissues were then dissected using a combination of blunt and sharp surgical technique and a pocket was formed. A single lumen power  injectable portacatheter was then tunneled through the subcutaneous tissues from the pocket to the dermatotomy and the port reservoir placed within the subcutaneous pocket. The venous access site was then serially dilated and a peel away vascular sheath placed over the wire. The wire was removed and the port catheter advanced into position under fluoroscopic guidance. The catheter tip is positioned in the cavoatrial junction. This was documented with a spot image. The portacatheter was then tested and found to flush and aspirate well. The port was flushed with saline followed by 100 units/mL heparinized saline. The pocket was then closed in two layers using first subdermal inverted interrupted absorbable sutures followed by a running subcuticular suture. The epidermis was then sealed with Dermabond. The dermatotomy at the venous access site was also seal with Dermabond. Patient tolerated the procedure well and remained hemodynamically stable throughout. No complications encountered and no significant blood loss encountered IMPRESSION: Status post right IJ port catheter placement. Catheter ready for use. Signed, Dulcy Fanny. Dellia Nims, RPVI [facility] Radiology Electronically Signed   By: Corrie Mckusick D.O.   On: 04/06/2019 14:08     Assessment and plan- Patient is a 62 y.o. 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 This is a post hospital discharge f/u visit for ongoing back pain  Etiology of her back pain is currently unclear. CT abdomen showed no acute pathology. Adenopathy had improved. CT lumbar spine was unremarkable as well. Her pain is spasmodic in nature around her back and around her rib cage/ up her neck. I have given her cyclobenzaprine for muscle relaxer and prn oxycodone 10 mg Q4 prn. It would be unusual for neulasta to cause pain like this for a whole week. I plan to eliminate neulasta for next cycle  RTC on  05/18/19 for cycle 2 day 1 of BR chemotherapy. Check cbc with diff/cmp/ldh/uric acid.   Patient will call if she does not improve with above measures   Visit  Diagnosis 1. Follicular lymphoma grade i, intra-abdominal lymph nodes ([place])   2. Acute midline low back pain without sciatica      Dr. Randa Evens, MD, MPH [facility] [phone] 04/29/2019 8:48 AM

## 2019-04-29 NOTE — Progress Notes (Signed)
Phone visit with pt. S/p neck excision.  Very well from excisional biopsy.  Main issue is back pain.  New chemotherapeutic regimen.  She did have also a stent exchange in the left side.  There is no problems with her voice there is no evidence of complications.  No fevers no chills and no pain in the neck.  A/P very well from surgical perspective.  No complications at this time.  We will follow-up on a as needed basis

## 2019-04-30 ENCOUNTER — Inpatient Hospital Stay: Payer: BLUE CROSS/BLUE SHIELD

## 2019-04-30 ENCOUNTER — Ambulatory Visit: Payer: BLUE CROSS/BLUE SHIELD | Admitting: Oncology

## 2019-04-30 ENCOUNTER — Encounter: Payer: Self-pay | Admitting: Oncology

## 2019-04-30 ENCOUNTER — Other Ambulatory Visit: Payer: Self-pay

## 2019-04-30 ENCOUNTER — Inpatient Hospital Stay: Payer: BLUE CROSS/BLUE SHIELD | Attending: Oncology | Admitting: Oncology

## 2019-04-30 VITALS — BP 134/81 | HR 108 | Temp 98.1°F | Resp 18

## 2019-04-30 DIAGNOSIS — L509 Urticaria, unspecified: Secondary | ICD-10-CM

## 2019-04-30 DIAGNOSIS — G893 Neoplasm related pain (acute) (chronic): Secondary | ICD-10-CM | POA: Insufficient documentation

## 2019-04-30 DIAGNOSIS — R1032 Left lower quadrant pain: Secondary | ICD-10-CM | POA: Diagnosis not present

## 2019-04-30 DIAGNOSIS — K219 Gastro-esophageal reflux disease without esophagitis: Secondary | ICD-10-CM

## 2019-04-30 DIAGNOSIS — F419 Anxiety disorder, unspecified: Secondary | ICD-10-CM | POA: Diagnosis not present

## 2019-04-30 DIAGNOSIS — L500 Allergic urticaria: Secondary | ICD-10-CM | POA: Diagnosis not present

## 2019-04-30 DIAGNOSIS — T782XXA Anaphylactic shock, unspecified, initial encounter: Secondary | ICD-10-CM

## 2019-04-30 DIAGNOSIS — T50905A Adverse effect of unspecified drugs, medicaments and biological substances, initial encounter: Secondary | ICD-10-CM

## 2019-04-30 DIAGNOSIS — Z9221 Personal history of antineoplastic chemotherapy: Secondary | ICD-10-CM

## 2019-04-30 DIAGNOSIS — N3289 Other specified disorders of bladder: Secondary | ICD-10-CM | POA: Insufficient documentation

## 2019-04-30 DIAGNOSIS — Z5111 Encounter for antineoplastic chemotherapy: Secondary | ICD-10-CM | POA: Insufficient documentation

## 2019-04-30 DIAGNOSIS — R1013 Epigastric pain: Secondary | ICD-10-CM

## 2019-04-30 DIAGNOSIS — R5383 Other fatigue: Secondary | ICD-10-CM | POA: Insufficient documentation

## 2019-04-30 DIAGNOSIS — Z79899 Other long term (current) drug therapy: Secondary | ICD-10-CM | POA: Diagnosis not present

## 2019-04-30 DIAGNOSIS — Z5112 Encounter for antineoplastic immunotherapy: Secondary | ICD-10-CM | POA: Diagnosis present

## 2019-04-30 DIAGNOSIS — C8238 Follicular lymphoma grade IIIa, lymph nodes of multiple sites: Secondary | ICD-10-CM

## 2019-04-30 DIAGNOSIS — J45909 Unspecified asthma, uncomplicated: Secondary | ICD-10-CM | POA: Diagnosis not present

## 2019-04-30 DIAGNOSIS — Z90710 Acquired absence of both cervix and uterus: Secondary | ICD-10-CM

## 2019-04-30 MED ORDER — DIPHENHYDRAMINE HCL 25 MG PO CAPS
25.0000 mg | ORAL_CAPSULE | Freq: Once | ORAL | Status: AC
  Administered 2019-04-30: 25 mg via ORAL

## 2019-04-30 MED ORDER — PREDNISONE 10 MG (21) PO TBPK: ORAL_TABLET | ORAL | 0 refills | Status: DC

## 2019-04-30 MED ORDER — HYDROCODONE-ACETAMINOPHEN 10-325 MG PO TABS: 1.0000 | ORAL_TABLET | Freq: Four times a day (QID) | ORAL | 0 refills | Status: AC | PRN

## 2019-04-30 NOTE — Progress Notes (Signed)
Patient here today to be evaluated in the symptom management clinic for an allergic reaction to oxycodone. She states that she has only taken 3 of the pills since it was prescribed earlier this week, and has hives all over her body. She did not take any benadryl because she didn't have any at home.

## 2019-04-30 NOTE — Progress Notes (Signed)
Symptom Management Consult note [facility]  [phone] [phone]  Patient Care Team: Patient, [name] as PCP - General (General Practice)   Name of the patient: Andrea Lloyd  [id number]  1956/04/26   Date of visit: 04/30/2019   Diagnosis-lymphoma  Chief complaint/ Reason for visit- allergic reaction  Heme/Onc history:  Oncology History Overview Note  patient is a 63 year old female who was seen by [facility] clinic GI for evaluation of left lower quadrant abdominal pain which has been ongoing for the last few months She has a history of hysterectomy/sacral colpopexy/ vaginal mesh in 2014.    2. This was followed by a CT of the abdomen which showed: Multiple enlarged abdominal and left common iliac nodes. Findings are worrisome for either metastatic adenopathy from a abdominal primary versus lymphoproliferative disorder. Consider further investigation with PET-CT and tissue sampling.   3. PET/CT scan from 01/08/2017 showed:Hypermetabolic upper abdominal and retroperitoneal lymph nodes, measuring up to 1.9 cm short axis, worrisome for nodal metastases versus lymphoma.   Hypermetabolic 7 mm short axis node at the left thoracic inlet, worrisome for lymphatic spread via the thoracic duct.   4. Tumor markers including CEA and CA-19-9 was normal at 1 and 8 respectively.   5. She is otherwise doing well and denies any complaints of unintentional weight loss, loss of appetite, fevers or chills or drenching night sweats. Her abdominal pain is mostly associated with eating and sometimes comes on a day later. It is mainly in her left lower quadrant and is a dull aching pain but sometimes she seems to have pain in the epigastrium and right upper quadrant   6. Patient underwent IR guided retroperitoneal LN biopsy which showed : DIAGNOSIS:  A. LYMPH NODE, LEFT RETROPERITONEAL; CT-GUIDED BIOPSY:  - FOLLICULAR LYMPHOMA, GRADE 1-2.    7.  Bone marrow biopsy negative for lymphoma. Normal karyotype and cytogenetics   8.  Patient underwent excisional lymph node biopsy of the left supraclavicular lymph node. Pathology showed Grdae 3A follicular lymphoma. No diffuse pattern was seen   Follicular lymphoma of intra-abdominal lymph nodes (H[facility])  03/28/2019 Initial Diagnosis   Follicular lymphoma of intra-abdominal lymph nodes ([place])   04/17/2019 -  Chemotherapy   The patient had palonosetron (ALOXI) injection 0.25 mg, 0.25 mg, Intravenous,  Once, 1 of 4 cycles Administration: 0.25 mg (04/17/2019), 0.25 mg (04/20/2019) pegfilgrastim-cbqv (UDENYCA) injection 6 mg, 6 mg, Subcutaneous, Once, 1 of 4 cycles Administration: 6 mg (04/21/2019) riTUXimab (RITUXAN) 700 mg in sodium chloride 0.9 % 250 mL (2.1875 mg/mL) infusion, 375 mg/m2 = 700 mg, Intravenous,  Once, 1 of 4 cycles Administration: 700 mg (04/17/2019) bendamustine (BENDEKA) 175 mg in sodium chloride 0.9 % 50 mL (3.0702 mg/mL) chemo infusion, 90 mg/m2 = 175 mg, Intravenous,  Once, 1 of 4 cycles Administration: 175 mg (04/17/2019), 175 mg (04/20/2019)  for chemotherapy treatment.    04/17/2019 Cancer Staging   Staging form: Hodgkin and Non-Hodgkin Lymphoma, AJCC 8th Edition - Clinical stage from 4/94/4967: Stage III (Follicular lymphoma) - Signed by Sindy Guadeloupe, MD on 04/17/2019     Interval history-patient is seen in [facility] today for an allergic reaction to oxycodone.  She was evaluated by Dr. Janese Banks on 04/29/2019 for follow-up after being admitted to the hospital for worsening spasmodic low back pain radiating around anteriorly to her rib cage and upward.  Given uncontrolled pain and increased amount of hydrocodone use (5-325 mg) medication changed to oxycodone 10 mg every 4 PRN.  Flexeril was also added.  CT abdomen/CT lumbar spine was unremarkable.  Had ureteral stent exchange by [facility].   Patient states, approximately 4 hours after taking her second dose of oxycodone she noticed itching  that started at her chest and has slowly extended down to her lower torso.  She has taken a total of 3 doses. It was only mildly itchy and resolved with lotion.  She went to bed and when she woke up she noticed hives and whelps all over her body.  She called our nurse triage line who advised her to take a Benadryl but unfortunately did not have any and presented straight to our clinic for evaluation.  She denies any neurological complaints, fevers or illnesses, shortness of breath or chest pain, nausea or vomiting, constipation or diarrhea. She has not taken Flexeril.   ECOG FS:0 - Asymptomatic  Review of systems- Review of Systems  Constitutional: Negative.  Negative for chills, fever, malaise/fatigue and weight loss.  HENT: Negative for congestion, ear pain and tinnitus.   Eyes: Negative.  Negative for blurred vision and double vision.  Respiratory: Negative.  Negative for cough, sputum production and shortness of breath.   Cardiovascular: Negative.  Negative for chest pain, palpitations and leg swelling.  Gastrointestinal: Negative.  Negative for abdominal pain, constipation, diarrhea, nausea and vomiting.  Genitourinary: Negative for dysuria, frequency and urgency.  Musculoskeletal: Positive for back pain (Chronic). Negative for falls.  Skin: Positive for itching and rash.  Neurological: Negative.  Negative for weakness and headaches.  Endo/Heme/Allergies: Negative.  Does not bruise/bleed easily.  Psychiatric/Behavioral: Negative.  Negative for depression. The patient is not nervous/anxious and does not have insomnia.      Current treatment- s/p cycle 1 Rituxan and Bendamustine (D 1-3).  Last given on 04/17/2019.  Allergies  Allergen Reactions   Oxycodone Hives   Adhesive [Tape] Rash    Bandaids     Past Medical History:  Diagnosis Date   Asthma    Family history of adverse reaction to anesthesia    Father - slow to wake   GERD (gastroesophageal reflux disease)    Gravida  4 para 4    Lymphoma ([place])      Past Surgical History:  Procedure Laterality Date   ABDOMINAL HYSTERECTOMY     06 01 2013   BLADDER SURGERY     COLONOSCOPY WITH PROPOFOL N/A 02/25/2017   Procedure: COLONOSCOPY WITH PROPOFOL;  Surgeon: Lucilla Lame, MD;  Location: [place];  Service: Endoscopy;  Laterality: N/A;   CYSTOSCOPY W/ URETERAL STENT PLACEMENT Left 03/28/2019   Procedure: CYSTOSCOPY WITH RETROGRADE PYELOGRAM/URETERAL STENT PLACEMENT;  Surgeon: Abbie Sons, MD;  Location: [facility];  Service: Urology;  Laterality: Left;   CYSTOSCOPY WITH STENT PLACEMENT Left 04/23/2019   Procedure: CYSTOSCOPY WITH LEFT STENT EXCHANGE;  Surgeon: Abbie Sons, MD;  Location: [facility];  Service: Urology;  Laterality: Left;   IR IMAGING GUIDED PORT INSERTION  04/06/2019   MASS BIOPSY Left 04/10/2019   Procedure: NECK MASS BIOPSY LEFT;  Surgeon: Jules Husbands, MD;  Location: [facility];  Service: General;  Laterality: Left;   POLYPECTOMY  02/25/2017   Procedure: POLYPECTOMY;  Surgeon: Lucilla Lame, MD;  Location: [place];  Service: Endoscopy;;   PORT A CATH INJECTION ([place] HX)     ROTATOR CUFF REPAIR Right    TONSILLECTOMY  1972    Social History   Socioeconomic History   Marital status: Married    Spouse name: Not on  file   Number of children: Not on file   Years of education: Not on file   Highest education level: Not on file  Occupational History   Not on file  Social Needs   Financial resource strain: Not on file   Food insecurity    Worry: Not on file    Inability: Not on file   Transportation needs    Medical: Not on file    Non-medical: Not on file  Tobacco Use   Smoking status: Never Smoker   Smokeless tobacco: Never Used  Substance and Sexual Activity   Alcohol use: No   Drug use: No   Sexual activity: Yes  Lifestyle   Physical activity    Days per week: Not on file    Minutes per session: Not on file   Stress: Not on  file  Relationships   Social connections    Talks on phone: Not on file    Gets together: Not on file    Attends religious service: Not on file    Active member of club or organization: Not on file    Attends meetings of clubs or organizations: Not on file    Relationship status: Not on file   Intimate partner violence    Fear of current or ex partner: Not on file    Emotionally abused: Not on file    Physically abused: Not on file    Forced sexual activity: Not on file  Other Topics Concern   Not on file  Social History Narrative   Not on file    Family History  Problem Relation Age of Onset   Alcohol abuse Maternal Grandfather    Healthy Mother    Cancer Father    Healthy Sister    Healthy Brother    Breast cancer Maternal Aunt    Hyperlipidemia Neg Hx    Heart disease Neg Hx    Diabetes Neg Hx      Current Outpatient Medications:    acyclovir (ZOVIRAX) 400 MG tablet, Take 1 tablet (400 mg total) by mouth daily., Disp: 30 tablet, Rfl: 3   albuterol (PROVENTIL HFA) 108 (90 Base) MCG/ACT inhaler, Inhale 2 puffs into the lungs every 6 (six) hours as needed for wheezing or shortness of breath. , Disp: , Rfl:    allopurinol (ZYLOPRIM) 300 MG tablet, Take 1 tablet (300 mg total) by mouth 2 (two) times daily., Disp: 60 tablet, Rfl: 2   Cholecalciferol (VITAMIN D3 PO), Take 20,000 mcg by mouth daily., Disp: , Rfl:    cyclobenzaprine (FLEXERIL) 10 MG tablet, Take 1 tablet (10 mg total) by mouth 3 (three) times daily as needed for muscle spasms., Disp: 60 tablet, Rfl: 0   lidocaine-prilocaine (EMLA) cream, Apply to affected area once, Disp: 30 g, Rfl: 3   LORazepam (ATIVAN) 0.5 MG tablet, Take 1 tablet (0.5 mg total) by mouth every 6 (six) hours as needed (Nausea or vomiting)., Disp: 30 tablet, Rfl: 0   Menaquinone-7 (VITAMIN K2 PO), Take 200 mcg by mouth daily., Disp: , Rfl:    Multiple Vitamin (MULTI-VITAMIN DAILY PO), Take 1 tablet by mouth daily. , Disp: ,  Rfl:    ondansetron (ZOFRAN) 8 MG tablet, Take 1 tablet (8 mg total) by mouth 2 (two) times daily as needed for refractory nausea / vomiting. Start on day 2 after bendamustine chemotherapy., Disp: 30 tablet, Rfl: 1   polyethylene glycol (MIRALAX / GLYCOLAX) 17 g packet, Take 17 g by mouth daily., Disp: ,  Rfl:    Probiotic Product (PROBIOTIC PO), Take 1 capsule by mouth daily., Disp: , Rfl:    prochlorperazine (COMPAZINE) 10 MG tablet, Take 1 tablet (10 mg total) by mouth every 6 (six) hours as needed (Nausea or vomiting)., Disp: 30 tablet, Rfl: 1   HYDROcodone-acetaminophen (NORCO) 10-325 MG tablet, Take 1 tablet by mouth every 6 (six) hours as needed., Disp: 90 tablet, Rfl: 0   oxybutynin (DITROPAN) 5 MG tablet, Take 1 tablet (5 mg total) by mouth every 8 (eight) hours as needed for bladder spasms., Disp: 30 tablet, Rfl: 0   predniSONE (STERAPRED UNI-PAK 21 TAB) 10 MG (21) TBPK tablet, Allergic reaction, Disp: 21 tablet, Rfl: 0  Physical exam:  Vitals:   04/30/19 1145  BP: 134/81  Pulse: (!) 108  Resp: 18  Temp: 98.1 F (36.7 C)  TempSrc: Tympanic  SpO2: 99%   Physical Exam Constitutional:      Appearance: Normal appearance.  HENT:     Head: Normocephalic and atraumatic.  Eyes:     Pupils: Pupils are equal, round, and reactive to light.  Neck:     Musculoskeletal: Normal range of motion.  Cardiovascular:     Rate and Rhythm: Normal rate and regular rhythm.     Heart sounds: Normal heart sounds. No murmur.  Pulmonary:     Effort: Pulmonary effort is normal.     Breath sounds: Normal breath sounds. No wheezing.  Abdominal:     General: Bowel sounds are normal. There is no distension.     Palpations: Abdomen is soft.     Tenderness: There is no abdominal tenderness.  Musculoskeletal: Normal range of motion.  Skin:    General: Skin is warm and dry.     Findings: Rash present. Rash is urticarial.     Comments: Upper torso extending to back and bilateral arms    Neurological:     Mental Status: She is alert and oriented to person, place, and time.  Psychiatric:        Judgment: Judgment normal.      CMP Latest Ref Rng & Units 04/28/2019  Glucose 70 - 99 mg/dL 102(H)  BUN 8 - 23 mg/dL 14  Creatinine 0.44 - 1.00 mg/dL 0.71  Sodium 135 - 145 mmol/L 138  Potassium 3.5 - 5.1 mmol/L 4.1  Chloride 98 - 111 mmol/L 100  CO2 22 - 32 mmol/L 27  Calcium 8.9 - 10.3 mg/dL 9.2  Total Protein 6.5 - 8.1 g/dL 7.0  Total Bilirubin 0.3 - 1.2 mg/dL 1.1  Alkaline Phos 38 - 126 U/L 142(H)  AST 15 - 41 U/L 26  ALT 0 - 44 U/L 34   CBC Latest Ref Rng & Units 04/28/2019  WBC 4.0 - 10.5 K/uL 14.2(H)  Hemoglobin 12.0 - 15.0 g/dL 14.0  Hematocrit 36.0 - 46.0 % 43.8  Platelets 150 - 400 K/uL 213    No images are attached to the encounter.  Ct L-spine No Charge  Result Date: 04/23/2019 CLINICAL DATA:  Severe back pain. Known lymphoma. History of left ureteral stent. EXAM: CT LUMBAR SPINE WITHOUT CONTRAST TECHNIQUE: Multidetector CT imaging of the lumbar spine was performed without intravenous contrast administration. Multiplanar CT image reconstructions were also generated. COMPARISON:  None. FINDINGS: Segmentation: Normal Alignment: Normal Vertebrae: No bone lesions or fractures are identified. Paraspinal and other soft tissues: Again noted is bulk E left-sided predominant retroperitoneal lymphadenopathy. The major vascular structures appear normal. There is a left-sided ureteral stent in the upper ureter. Mild left-sided  hydronephrosis. Disc levels: No large disc protrusions, spinal or foraminal stenosis is demonstrated. IMPRESSION: Normal alignment of the lumbar vertebral bodies and no acute bony findings or worrisome bone lesions. The spinal canal is generous in is no significant canal or foraminal stenosis. Stable retroperitoneal lymphadenopathy. Left ureteral stent in the upper left ureter.  Mild hydronephrosis. Electronically Signed   By: Marijo Sanes M.D.   On:  04/23/2019 08:59   Dg C-arm 1-60 Min-no Report  Result Date: 04/23/2019 Fluoroscopy was utilized by the requesting physician.  No radiographic interpretation.   Ct Angio Chest/abd/pel For Dissection W And/or W/wo  Result Date: 04/23/2019 CLINICAL DATA:  Lower back pain.  Chest pain EXAM: CT ANGIOGRAPHY CHEST, ABDOMEN AND PELVIS TECHNIQUE: Multidetector CT imaging through the chest, abdomen and pelvis was performed using the standard protocol during bolus administration of intravenous contrast. Multiplanar reconstructed images and MIPs were obtained and reviewed to evaluate the vascular anatomy. CONTRAST:  175m ISOVUE-370 IOPAMIDOL (ISOVUE-370) INJECTION 76% COMPARISON:  PET CT 04/02/2019 FINDINGS: CTA CHEST FINDINGS Cardiovascular: No intramural hematoma the thoracic aorta on noncontrast phase. Negative for aortic dissection or aneurysm. No noted atheromatous changes. No pulmonary artery filling defect. Mediastinum/Nodes: Negative for thoracic adenopathy or mass Lungs/Pleura: There is no edema, consolidation, effusion, or pneumothorax. Musculoskeletal: No acute or aggressive finding Review of the MIP images confirms the above findings. CTA ABDOMEN AND PELVIS FINDINGS VASCULAR Aorta: Minimal atheromatous plaque.  No dissection or aneurysm. Celiac: Vessels are smooth and widely patent SMA: Vessels are smooth and widely patent Renals: Accessory left renal artery. Vessels are smooth and widely patent IMA: Patent Inflow: Widely patent Veins: Negative in the arterial phase Review of the MIP images confirms the above findings. NON-VASCULAR Hepatobiliary: Rounded hypervascular foci in inferior central liver and segment 3 not seen on most recent portal venous and delayed phase abdominal CT, possibly shunts. These measure up to 16 mm. Patient is under surveillance for lymphoma.No evidence of biliary obstruction or stone. Pancreas: Unremarkable. Spleen: Unremarkable. Adrenals/Urinary Tract: Negative adrenals.  Essentially resolved left hydronephrosis. Left ureteral stent with retention loop loosely formed at the level of the upper ureter, alignment similar to 04/03/2019 scout CT. Unremarkable bladder. Stomach/Bowel: Moderate stool volume without colonic over distension. No bowel wall thickening Lymphatic: Retro crural and retroperitoneal lymphadenopathy which has decreased in bulk since 04/28/2019. nodes remain homogeneous. Reproductive:Hysterectomy Other: No ascites or pneumoperitoneum. Musculoskeletal: No acute or aggressive finding. Chronic rim enhancing fluid collection in the left subcutaneous gluteal region. Review of the MIP images confirms the above findings. IMPRESSION: 1. No evidence of acute aortic syndrome. No significant atheromatous changes or stenosis. 2. Lymphoma with interval positive treatment response. 3. Left ureteral stent with loosely formed upper retention loop at the level of the upper ureter, unchanged. 4. 2 hypervascular foci in the liver not seen on most recent PET-CT and abdominal CT, possible shunts. Attention on follow-up. Electronically Signed   By: JMonte FantasiaM.D.   On: 04/23/2019 05:03    Assessment and plan- Patient is a 63y.o. [facility]  3 oxycodone 10 mg tablets.   Follicular lymphoma: Status post cycle 1 of BR chemotherapy.  Tolerating well.  Scheduled to return to clinic on 05/18/2019 for cycle 2-day 1 of BR chemotherapy.  Hives: After medication review and denial of new foods and/or lotions, laundry detergents the change in pain medication appears to be the culprit.  In clinic patient will receive 25 mg Benadryl.  On assessment, she is sitting comfortably in her chair.  She is in no distress. Vitals signs are stable.   Plan: Give 25 mg p.o. Benadryl in clinic. Rx prednisone tablet pack x6 days.  I also instructed her to take an additional 25 mg Benadryl this evening and can start Zyrtec/Claritin  tomorrow morning for the next several days.  Take steroids as directed. RX Norco 10-325 mg q 6 PRN for pain.  Stop Oxycodone 10 mg tab q 4 hours for pain.   Disposition: Return to clinic as scheduled on 05/18/2019 for MD assessment, labs and consideration of cycle 2 BC chemotherapy. Start Norco 10-325 m q 6 PRN.  Start Steroid dose pack today. Take Benadryl again this evening (Approx 6 hours from previous). Start Claritin/Zyrtec daily tomorrow.   Visit Diagnosis 1. Anaphylaxis, initial encounter   2. Hives    Patient expressed understanding and was in agreement with this plan. She also understands that She can call clinic at any time with any questions, concerns, or complaints.   Greater than 50% was spent in counseling and coordination of care with this patient including but not limited to discussion of the relevant topics above (See A&P) including, but not limited to diagnosis and management of acute and chronic medical conditions.   Thank you for allowing me to participate in the care of this very pleasant patient.   Jacquelin Hawking, NP [place] [facility] Cell - [phone] Pager- [phone] 05/15/2019 9:45 AM

## 2019-05-04 ENCOUNTER — Telehealth: Payer: Self-pay | Admitting: Urology

## 2019-05-04 ENCOUNTER — Other Ambulatory Visit: Payer: Self-pay | Admitting: Urology

## 2019-05-04 MED ORDER — OXYBUTYNIN CHLORIDE 5 MG PO TABS: 5.0000 mg | ORAL_TABLET | Freq: Three times a day (TID) | ORAL | 0 refills | Status: DC | PRN

## 2019-05-04 NOTE — Telephone Encounter (Signed)
Pt needs rx refill for oxybutnin. Please advise

## 2019-05-05 ENCOUNTER — Other Ambulatory Visit: Payer: BLUE CROSS/BLUE SHIELD

## 2019-05-05 ENCOUNTER — Ambulatory Visit: Payer: BLUE CROSS/BLUE SHIELD | Admitting: Oncology

## 2019-05-05 ENCOUNTER — Ambulatory Visit: Payer: BLUE CROSS/BLUE SHIELD

## 2019-05-05 NOTE — Telephone Encounter (Signed)
Pt called and states that she has also sent a MyChart message and her refill request was denied. She would like a call back. Please advise.

## 2019-05-06 MED ORDER — OXYBUTYNIN CHLORIDE 5 MG PO TABS: 5.0000 mg | ORAL_TABLET | Freq: Three times a day (TID) | ORAL | 0 refills | Status: DC | PRN

## 2019-05-06 NOTE — Telephone Encounter (Addendum)
Spoke with patient-RX was printed originally-Resent RX request electronically. Patient aware to let us know if she has not received it.

## 2019-05-06 NOTE — Addendum Note (Signed)
Addended by: Verlene Mayer A on: 05/06/2019 09:29 AM   Modules accepted: Orders

## 2019-05-06 NOTE — Telephone Encounter (Signed)
Pt LMOM and states that her RX is not at [facility].

## 2019-05-14 ENCOUNTER — Ambulatory Visit: Payer: BLUE CROSS/BLUE SHIELD | Admitting: Oncology

## 2019-05-14 ENCOUNTER — Other Ambulatory Visit: Payer: BLUE CROSS/BLUE SHIELD

## 2019-05-14 ENCOUNTER — Ambulatory Visit: Payer: BLUE CROSS/BLUE SHIELD

## 2019-05-15 ENCOUNTER — Ambulatory Visit: Payer: BLUE CROSS/BLUE SHIELD | Admitting: Oncology

## 2019-05-15 ENCOUNTER — Ambulatory Visit: Payer: BLUE CROSS/BLUE SHIELD

## 2019-05-15 ENCOUNTER — Other Ambulatory Visit: Payer: BLUE CROSS/BLUE SHIELD

## 2019-05-18 ENCOUNTER — Inpatient Hospital Stay: Payer: BLUE CROSS/BLUE SHIELD

## 2019-05-18 ENCOUNTER — Inpatient Hospital Stay (HOSPITAL_BASED_OUTPATIENT_CLINIC_OR_DEPARTMENT_OTHER): Payer: BLUE CROSS/BLUE SHIELD | Admitting: Oncology

## 2019-05-18 ENCOUNTER — Encounter: Payer: Self-pay | Admitting: Oncology

## 2019-05-18 ENCOUNTER — Other Ambulatory Visit: Payer: Self-pay

## 2019-05-18 VITALS — BP 118/74 | HR 114 | Temp 98.3°F | Resp 18 | Wt 186.9 lb

## 2019-05-18 VITALS — BP 110/73 | HR 79 | Resp 18

## 2019-05-18 DIAGNOSIS — C8233 Follicular lymphoma grade IIIa, intra-abdominal lymph nodes: Secondary | ICD-10-CM

## 2019-05-18 DIAGNOSIS — C8203 Follicular lymphoma grade I, intra-abdominal lymph nodes: Secondary | ICD-10-CM

## 2019-05-18 DIAGNOSIS — C8238 Follicular lymphoma grade IIIa, lymph nodes of multiple sites: Secondary | ICD-10-CM | POA: Diagnosis not present

## 2019-05-18 DIAGNOSIS — Z5111 Encounter for antineoplastic chemotherapy: Secondary | ICD-10-CM

## 2019-05-18 DIAGNOSIS — Z5181 Encounter for therapeutic drug level monitoring: Secondary | ICD-10-CM

## 2019-05-18 DIAGNOSIS — Z95828 Presence of other vascular implants and grafts: Secondary | ICD-10-CM

## 2019-05-18 LAB — COMPREHENSIVE METABOLIC PANEL
ALT: 21 U/L (ref 0–44)
AST: 20 U/L (ref 15–41)
Albumin: 4.1 g/dL (ref 3.5–5.0)
Alkaline Phosphatase: 77 U/L (ref 38–126)
Anion gap: 8 (ref 5–15)
BUN: 16 mg/dL (ref 8–23)
CO2: 27 mmol/L (ref 22–32)
Calcium: 9.1 mg/dL (ref 8.9–10.3)
Chloride: 105 mmol/L (ref 98–111)
Creatinine, Ser: 0.61 mg/dL (ref 0.44–1.00)
GFR calc Af Amer: >60 mL/min (ref >60)
GFR calc non Af Amer: >60 mL/min (ref >60)
Glucose, Bld: 113 mg/dL — ABNORMAL HIGH (ref 70–99)
Potassium: 3.7 mmol/L (ref 3.5–5.1)
Sodium: 140 mmol/L (ref 135–145)
Total Bilirubin: 1.3 mg/dL — ABNORMAL HIGH (ref 0.3–1.2)
Total Protein: 6.7 g/dL (ref 6.5–8.1)

## 2019-05-18 LAB — CBC WITH DIFFERENTIAL/PLATELET
Abs Immature Granulocytes: 0.03 10*3/uL (ref 0.00–0.07)
Basophils Absolute: 0.1 10*3/uL (ref 0.0–0.1)
Basophils Relative: 1 %
Eosinophils Absolute: 0.3 10*3/uL (ref 0.0–0.5)
Eosinophils Relative: 5 %
HCT: 39.3 % (ref 36.0–46.0)
Hemoglobin: 12.9 g/dL (ref 12.0–15.0)
Immature Granulocytes: 1 %
Lymphocytes Relative: 9 %
Lymphs Abs: 0.6 10*3/uL — ABNORMAL LOW (ref 0.7–4.0)
MCH: 28.5 pg (ref 26.0–34.0)
MCHC: 32.8 g/dL (ref 30.0–36.0)
MCV: 86.9 fL (ref 80.0–100.0)
Monocytes Absolute: 0.6 10*3/uL (ref 0.1–1.0)
Monocytes Relative: 9 %
Neutro Abs: 4.9 10*3/uL (ref 1.7–7.7)
Neutrophils Relative %: 75 %
Platelets: 191 10*3/uL (ref 150–400)
RBC: 4.52 MIL/uL (ref 3.87–5.11)
RDW: 15.8 % — ABNORMAL HIGH (ref 11.5–15.5)
WBC: 6.5 10*3/uL (ref 4.0–10.5)
nRBC: 0 % (ref 0.0–0.2)

## 2019-05-18 LAB — URIC ACID: Uric Acid, Serum: 3.2 mg/dL (ref 2.5–7.1)

## 2019-05-18 LAB — LACTATE DEHYDROGENASE: LDH: 125 U/L (ref 98–192)

## 2019-05-18 MED ORDER — ACETAMINOPHEN 325 MG PO TABS
650.0000 mg | ORAL_TABLET | Freq: Once | ORAL | Status: AC
  Administered 2019-05-18: 650 mg via ORAL
  Filled 2019-05-18: qty 2

## 2019-05-18 MED ORDER — PALONOSETRON HCL INJECTION 0.25 MG/5ML
0.2500 mg | Freq: Once | INTRAVENOUS | Status: AC
  Administered 2019-05-18: 14:00:00 0.25 mg via INTRAVENOUS

## 2019-05-18 MED ORDER — DIPHENHYDRAMINE HCL 25 MG PO CAPS
50.0000 mg | ORAL_CAPSULE | Freq: Once | ORAL | Status: AC
  Administered 2019-05-18: 50 mg via ORAL
  Filled 2019-05-18: qty 2

## 2019-05-18 MED ORDER — HEPARIN SOD (PORK) LOCK FLUSH 100 UNIT/ML IV SOLN
500.0000 [IU] | Freq: Once | INTRAVENOUS | Status: AC | PRN
  Administered 2019-05-18: 500 [IU]
  Filled 2019-05-18: qty 5

## 2019-05-18 MED ORDER — SODIUM CHLORIDE 0.9 % IV SOLN: 375.0000 mg/m2 | Freq: Once | INTRAVENOUS | Status: DC

## 2019-05-18 MED ORDER — SODIUM CHLORIDE 0.9 % IV SOLN
Freq: Once | INTRAVENOUS | Status: AC
  Administered 2019-05-18: 10:00:00 via INTRAVENOUS
  Filled 2019-05-18: qty 250

## 2019-05-18 MED ORDER — SODIUM CHLORIDE 0.9 % IV SOLN: 10.0000 mg | Freq: Once | INTRAVENOUS | Status: DC

## 2019-05-18 MED ORDER — SODIUM CHLORIDE 0.9% FLUSH
10.0000 mL | Freq: Once | INTRAVENOUS | Status: AC
  Administered 2019-05-18: 09:00:00 10 mL via INTRAVENOUS
  Filled 2019-05-18: qty 10

## 2019-05-18 MED ORDER — DEXAMETHASONE SODIUM PHOSPHATE 10 MG/ML IJ SOLN
10.0000 mg | Freq: Once | INTRAMUSCULAR | Status: AC
  Administered 2019-05-18: 10 mg via INTRAVENOUS
  Filled 2019-05-18: qty 1

## 2019-05-18 MED ORDER — SODIUM CHLORIDE 0.9 % IV SOLN
375.0000 mg/m2 | Freq: Once | INTRAVENOUS | Status: AC
  Administered 2019-05-18: 700 mg via INTRAVENOUS
  Filled 2019-05-18: qty 20

## 2019-05-18 MED ORDER — SODIUM CHLORIDE 0.9 % IV SOLN
90.0000 mg/m2 | Freq: Once | INTRAVENOUS | Status: AC
  Administered 2019-05-18: 14:00:00 175 mg via INTRAVENOUS
  Filled 2019-05-18: qty 7

## 2019-05-18 NOTE — Discharge Summary (Addendum)
[facility] NAME: Andrea Lloyd    MR#:  [id number]  DATE OF BIRTH:  Jul 25, 1956  DATE OF ADMISSION:  04/23/2019   ADMITTING PHYSICIAN: Lang Snow, NP  DATE OF DISCHARGE: 04/24/2019  1:39 PM  PRIMARY CARE PHYSICIAN: Patient, [name]   ADMISSION DIAGNOSIS:   Back pain [M54.9] Elevated lactic acid level [R79.89] Intractable low back pain [M54.5] Ureteral stent displacement, subsequent encounter [T83.122D] Leukocytosis, unspecified type [D72.829]  DISCHARGE DIAGNOSIS:   Active Problems:   Acute low back pain with radicular symptoms, duration less than 6 weeks   SECONDARY DIAGNOSIS:   Past Medical History:  Diagnosis Date  . Asthma   . Family history of adverse reaction to anesthesia    Father - slow to wake  . GERD (gastroesophageal reflux disease)   . Gravida 4 para 4   . Lymphoma [facility])     HOSPITAL COURSE:   63 y.o. female with pertinent past medical history of 3 grade 3A follicular lymphoma and bulky retroperitoneal adenopathy with left hydronephrosis s/p left ureteral stent placement (03/28/2019) and GERD presenting to the ED with chief complaints of acute low back pain since 8 pm on 04/22/2019.  1. Acute low back pain - Patient with hx of grade 3A stage III follicular lymphoma treated with chemo. - Received Neulasta 04/21/2019 - In the absence of other identifiable causes, acute low back pain is likely a side effect of the Neulasta - CTA chest/abdomen/pelvis showed no new acute abnormalities - CT lumbar spine also negative - Patient was treated with PRN IV and p.o. pain meds and IVFs - uric acid elevated - Oncology to continue to follow on outpatient basis. Plan for PRN pain meds for her next cycle of chemotherapy per her oncologist.  2. Hydronephrosis -secondary to bulky adenopathy due to lymphoma - Status post left ureteral stent placement (03/28/2019), questionable displacement although not  likely cause of back pain. - s/p cystoscopy with left ureteral stent exchange by urology Dr. Bernardo Heater on 04/23/2019 - Urology will continue to follow on outpatient basis  3. Leukocytosis -likely secondary to Neulasta  4. UTI - Urine culture pending - treated with IV Rocephin, will discharge on ceftin po  DISCHARGE CONDITIONS:   stable CONSULTS OBTAINED:   Treatment Team:  Sindy Guadeloupe, MD  DRUG ALLERGIES:   Allergies  Allergen Reactions  . Oxycodone Hives  . Adhesive [Tape] Rash    Bandaids   DISCHARGE MEDICATIONS:   Allergies as of 04/24/2019      Reactions   Adhesive [tape] Rash   Bandaids      Medication List    STOP taking these medications   dexamethasone 4 MG tablet Commonly known as: DECADRON   HYDROcodone-acetaminophen 5-325 MG tablet Commonly known as: NORCO/VICODIN   predniSONE 50 MG tablet Commonly known as: DELTASONE     TAKE these medications   acyclovir 400 MG tablet Commonly known as: ZOVIRAX Take 1 tablet (400 mg total) by mouth daily.   allopurinol 300 MG tablet Commonly known as: ZYLOPRIM Take 1 tablet (300 mg total) by mouth 2 (two) times daily.   lidocaine-prilocaine cream Commonly known as: EMLA Apply to affected area once   LORazepam 0.5 MG tablet Commonly known as: Ativan Take 1 tablet (0.5 mg total) by mouth every 6 (six) hours as needed (Nausea or vomiting).   MULTI-VITAMIN DAILY PO Take 1 tablet by mouth daily.   ondansetron 8 MG tablet Commonly known as:  Zofran Take 1 tablet (8 mg total) by mouth 2 (two) times daily as needed for refractory nausea / vomiting. Start on day 2 after bendamustine chemotherapy.   polyethylene glycol 17 g packet Commonly known as: MIRALAX / GLYCOLAX Take 17 g by mouth daily.   PROBIOTIC PO Take 1 capsule by mouth daily.   prochlorperazine 10 MG tablet Commonly known as: COMPAZINE Take 1 tablet (10 mg total) by mouth every 6 (six) hours as needed (Nausea or vomiting).   Proventil  HFA 108 (90 Base) MCG/ACT inhaler Generic drug: albuterol Inhale 2 puffs into the lungs every 6 (six) hours as needed for wheezing or shortness of breath.   VITAMIN D3 PO Take 20,000 mcg by mouth daily.   VITAMIN K2 PO Take 200 mcg by mouth daily.     ASK your doctor about these medications   cefUROXime 250 MG tablet Commonly known as: Ceftin Take 1 tablet (250 mg total) by mouth 2 (two) times daily with a meal for 3 days. Ask about: Should I take this medication?        DISCHARGE INSTRUCTIONS:    DIET:   Regular diet  ACTIVITY:   Activity as tolerated  OXYGEN:   Home Oxygen: No.  Oxygen Delivery: room air  DISCHARGE LOCATION:   home   If you experience worsening of your admission symptoms, develop shortness of breath, life threatening emergency, suicidal or homicidal thoughts you must seek medical attention immediately by calling 911 or calling your MD immediately  if symptoms less severe.  You Must read complete instructions/literature along with all the possible adverse reactions/side effects for all the Medicines you take and that have been prescribed to you. Take any new Medicines after you have completely understood and accpet all the possible adverse reactions/side effects.   Please note  You were cared for by a hospitalist during your hospital stay. If you have any questions about your discharge medications or the care you received while you were in the hospital after you are discharged, you can call the unit and asked to speak with the hospitalist on call if the hospitalist that took care of you is not available. Once you are discharged, your primary care physician will handle any further medical issues. Please note that NO REFILLS for any discharge medications will be authorized once you are discharged, as it is imperative that you return to your primary care physician (or establish a relationship with a primary care physician if you do not have one) for your  aftercare needs so that they can reassess your need for medications and monitor your lab values.    On the day of Discharge:  VITAL SIGNS:   Blood pressure 119/74, pulse 64, temperature 98.4 F (36.9 C), temperature source Oral, resp. rate 20, height 5\' 5"  (1.651 m), weight 84.6 kg, SpO2 97 %.  PHYSICAL EXAMINATION:    GENERAL:  63 y.o.-year-old patient lying in the bed with no acute distress.  EYES: Pupils equal, round, reactive to light and accommodation. No scleral icterus. Extraocular muscles intact.  HEENT: Head atraumatic, normocephalic. Oropharynx and nasopharynx clear.  NECK:  Supple, no jugular venous distention. No thyroid enlargement, no tenderness.  LUNGS: Normal breath sounds bilaterally, no wheezing, rales,rhonchi or crepitation. No use of accessory muscles of respiration.  CARDIOVASCULAR: S1, S2 normal. No murmurs, rubs, or gallops.  ABDOMEN: Soft, non-tender, non-distended. Bowel sounds present. No organomegaly or mass.  EXTREMITIES: No pedal edema, cyanosis, or clubbing.  NEUROLOGIC: Cranial nerves II through XII  are intact. Muscle strength 5/5 in all extremities. Sensation intact. Gait not checked.  PSYCHIATRIC: The patient is alert and oriented x 3.  SKIN: No obvious rash, lesion, or ulcer.   DATA REVIEW:   CBC Recent Labs  Lab 05/18/19 0852  WBC 6.5  HGB 12.9  HCT 39.3  PLT 191    Chemistries  Recent Labs  Lab 05/18/19 0852  NA 140  K 3.7  CL 105  CO2 27  GLUCOSE 113*  BUN 16  CREATININE 0.61  CALCIUM 9.1  AST 20  ALT 21  ALKPHOS 77  BILITOT 1.3*     Microbiology Results  Results for orders placed or performed during the hospital encounter of 04/23/19  Urine Culture     Status: Abnormal   Collection Time: 04/23/19  4:20 AM   Specimen: Urine, Clean Catch  Result Value Ref Range Status   Specimen Description   Final    URINE, CLEAN CATCH Performed at [facility], [address], [place], [place] [address]    Special  Requests   Final    Normal Performed at [facility], [address] [place], [place] [address]    Culture MULTIPLE SPECIES PRESENT, SUGGEST RECOLLECTION (A)  Final   Report Status 04/24/2019 FINAL  Final  SARS Coronavirus 2 (CEPHEID - Performed in [facility] hospital lab), Hosp Order     Status: None   Collection Time: 04/23/19  4:20 AM   Specimen: Nasopharyngeal Swab  Result Value Ref Range Status   SARS Coronavirus 2 NEGATIVE NEGATIVE Final    Comment: (NOTE) If result is NEGATIVE SARS-CoV-2 target nucleic acids are NOT DETECTED. The SARS-CoV-2 RNA is generally detectable in upper and lower  respiratory specimens during the acute phase of infection. The lowest  concentration of SARS-CoV-2 viral copies this assay can detect is 250  copies / mL. A negative result does not preclude SARS-CoV-2 infection  and should not be used as the sole basis for treatment or other  patient management decisions.  A negative result may occur with  improper specimen collection / handling, submission of specimen other  than nasopharyngeal swab, presence of viral mutation(s) within the  areas targeted by this assay, and inadequate number of viral copies  (<250 copies / mL). A negative result must be combined with clinical  observations, patient history, and epidemiological information. If result is POSITIVE SARS-CoV-2 target nucleic acids are DETECTED. The SARS-CoV-2 RNA is generally detectable in upper and lower  respiratory specimens dur ing the acute phase of infection.  Positive  results are indicative of active infection with SARS-CoV-2.  Clinical  correlation with patient history and other diagnostic information is  necessary to determine patient infection status.  Positive results do  not rule out bacterial infection or co-infection with other viruses. If result is PRESUMPTIVE POSTIVE SARS-CoV-2 nucleic acids MAY BE PRESENT.   A presumptive positive result was obtained on the  submitted specimen  and confirmed on repeat testing.  While 2019 novel coronavirus  (SARS-CoV-2) nucleic acids may be present in the submitted sample  additional confirmatory testing may be necessary for epidemiological  and / or clinical management purposes  to differentiate between  SARS-CoV-2 and other Sarbecovirus currently known to infect humans.  If clinically indicated additional testing with an alternate test  methodology [phone]) is advised. The SARS-CoV-2 RNA is generally  detectable in upper and lower respiratory sp ecimens during the acute  phase of infection. The expected result is Negative. Fact Sheet  for Patients:  StrictlyIdeas.no Fact Sheet for Healthcare Providers: BankingDealers.co.za This test is not yet approved or cleared by the [place] FDA and has been authorized for detection and/or diagnosis of SARS-CoV-2 by FDA under an Emergency Use Authorization (EUA).  This EUA will remain in effect (meaning this test can be used) for the duration of the COVID-19 declaration under Section 564(b)(1) of the Act, 21 U.S.C. section 360bbb-3(b)(1), unless the authorization is terminated or revoked sooner. Performed at [facility], [address] [place], [place] [address]     RADIOLOGY:  No results found.   Management plans discussed with the patient, family and they are in agreement.  CODE STATUS:  Code Status History    Date Active Date Inactive Code Status Order ID Comments User Context   04/23/2019 0808 04/24/2019 1639 Full Code [id number]  Lang Snow, NP ED   03/28/2019 1624 03/29/2019 1532 Full Code [id number]  Henreitta Leber, MD Inpatient   Advance Care Planning Activity      TOTAL TIME TAKING CARE OF THIS PATIENT: 40 minutes.    Karen Kays N.P on 05/18/2019 at 3:06 PM  Between 7am to 6pm - Pager - [phone]  After 6pm go to [email or web address] - Proofreader  [employer]  [facility]  Office  [phone]  CC: Primary care physician; Patient, [name]   Note: This dictation was prepared with Dragon dictation along with smaller phrase technology. Any transcriptional errors that result from this process are unintentional.

## 2019-05-18 NOTE — Progress Notes (Signed)
Patient is doing well no compaints, does mention Pabon is very expensive and would like to discuss how many treatments she will get.

## 2019-05-18 NOTE — Progress Notes (Signed)
Hematology/Oncology Consult note [facility]  [phone] [phone]  Patient Care Team: Patient, [name] as PCP - General (General Practice)   Name of the patient: Andrea Lloyd  [id number]  24-Jan-1956   Date of visit: 05/18/19  Diagnosis- stage III grade 3A follicular lymphoma  Chief complaint/ Reason for visit-on treatment assessment prior to cycle 2-day 1 of bendamustine and Rituxan  Heme/Onc history: patient is a63 year old female who was seen by [facility] for evaluation of left lower quadrant abdominal pain which has been ongoing for the last few monthsShe has a history of hysterectomy/sacral colpopexy/ vaginal mesh in 2014.   2. This was followed by a CT of the abdomen which showed: Multiple enlarged abdominal and left common iliac nodes. Findings are worrisome for either metastatic adenopathy from a abdominal primary versus lymphoproliferative disorder. Consider further investigation with PET-CT and tissue sampling.  3. PET/CT scan from 01/08/2017 showed:Hypermetabolic upper abdominal and retroperitoneal lymph nodes, measuring up to 1.9 cm short axis, worrisome for nodal metastases versus lymphoma.  Hypermetabolic 7 mm short axis node at the left thoracic inlet, worrisome for lymphatic spread via the thoracic duct.  4. Tumor markers including CEA and CA-19-9 was normal at 1 and 8 respectively.  5. She is otherwise doing well and denies any complaints of unintentional weight loss, loss of appetite, fevers or chills or drenching night sweats. Her abdominal pain is mostly associated with eating and sometimes comes on a day later. It is mainly in her left lower quadrant and is a dull aching pain but sometimes she seems to have pain in the epigastrium and right upper quadrant  6. Patient underwent IR guided retroperitoneal LN biopsy which showed : DIAGNOSIS:  A. LYMPH NODE, LEFT RETROPERITONEAL; CT-GUIDED  BIOPSY:  - FOLLICULAR LYMPHOMA, GRADE 1-2.  7. Bone marrow biopsy negative for lymphoma. Normal karyotype and cytogenetics  8.Patient underwent excisional lymph node biopsy of the left supraclavicular lymph node. Pathology showed Grdae 3A follicular lymphoma. No diffuse pattern was seen.  Patient was started on bendamustine Rituxan chemotherapy on 04/17/2019  Interval history-currently patient reports feeling well and states that her back pain which was radiating up her spine has completely resolved.  She does have occasional bladder spasms for which she is still using oxybutynin.  Denies fever.  She does report some fatigue and anxiety.  ECOG PS- 1 Pain scale- 0 Opioid associated constipation- no  Review of systems- Review of Systems  Constitutional: Positive for malaise/fatigue. Negative for chills, fever and weight loss.  HENT: Negative for congestion, ear discharge and nosebleeds.   Eyes: Negative for blurred vision.  Respiratory: Negative for cough, hemoptysis, sputum production, shortness of breath and wheezing.   Cardiovascular: Negative for chest pain, palpitations, orthopnea and claudication.  Gastrointestinal: Negative for abdominal pain, blood in stool, constipation, diarrhea, heartburn, melena, nausea and vomiting.  Genitourinary: Negative for dysuria, flank pain, frequency, hematuria and urgency.  Musculoskeletal: Negative for back pain, joint pain and myalgias.  Skin: Negative for rash.  Neurological: Negative for dizziness, tingling, focal weakness, seizures, weakness and headaches.  Endo/Heme/Allergies: Does not bruise/bleed easily.  Psychiatric/Behavioral: Negative for depression and suicidal ideas. The patient is nervous/anxious. The patient does not have insomnia.        Allergies  Allergen Reactions   Oxycodone Hives   Adhesive [Tape] Rash    Bandaids     Past Medical History:  Diagnosis Date   Asthma    Family history of adverse reaction to  anesthesia    Father - slow to wake   GERD (gastroesophageal reflux disease)    Gravida 4 para 4    Lymphoma ([place])      Past Surgical History:  Procedure Laterality Date   ABDOMINAL HYSTERECTOMY     06 01 2013   BLADDER SURGERY     COLONOSCOPY WITH PROPOFOL N/A 02/25/2017   Procedure: COLONOSCOPY WITH PROPOFOL;  Surgeon: Lucilla Lame, MD;  Location: [place];  Service: Endoscopy;  Laterality: N/A;   CYSTOSCOPY W/ URETERAL STENT PLACEMENT Left 03/28/2019   Procedure: CYSTOSCOPY WITH RETROGRADE PYELOGRAM/URETERAL STENT PLACEMENT;  Surgeon: Abbie Sons, MD;  Location: [facility];  Service: Urology;  Laterality: Left;   CYSTOSCOPY WITH STENT PLACEMENT Left 04/23/2019   Procedure: CYSTOSCOPY WITH LEFT STENT EXCHANGE;  Surgeon: Abbie Sons, MD;  Location: [facility];  Service: Urology;  Laterality: Left;   IR IMAGING GUIDED PORT INSERTION  04/06/2019   MASS BIOPSY Left 04/10/2019   Procedure: NECK MASS BIOPSY LEFT;  Surgeon: Jules Husbands, MD;  Location: [facility];  Service: General;  Laterality: Left;   POLYPECTOMY  02/25/2017   Procedure: POLYPECTOMY;  Surgeon: Lucilla Lame, MD;  Location: [place];  Service: Endoscopy;;   PORT A CATH INJECTION ([place] HX)     ROTATOR CUFF REPAIR Right    TONSILLECTOMY  1972    Social History   Socioeconomic History   Marital status: Married    Spouse name: Not on file   Number of children: Not on file   Years of education: Not on file   Highest education level: Not on file  Occupational History   Not on file  Social Needs   Financial resource strain: Not on file   Food insecurity    Worry: Not on file    Inability: Not on file   Transportation needs    Medical: Not on file    Non-medical: Not on file  Tobacco Use   Smoking status: Never Smoker   Smokeless tobacco: Never Used  Substance and Sexual Activity   Alcohol use: No   Drug use: No   Sexual activity: Yes  Lifestyle   Physical  activity    Days per week: Not on file    Minutes per session: Not on file   Stress: Not on file  Relationships   Social connections    Talks on phone: Not on file    Gets together: Not on file    Attends religious service: Not on file    Active member of club or organization: Not on file    Attends meetings of clubs or organizations: Not on file    Relationship status: Not on file   Intimate partner violence    Fear of current or ex partner: Not on file    Emotionally abused: Not on file    Physically abused: Not on file    Forced sexual activity: Not on file  Other Topics Concern   Not on file  Social History Narrative   Not on file    Family History  Problem Relation Age of Onset   Alcohol abuse Maternal Grandfather    Healthy Mother    Cancer Father    Healthy Sister    Healthy Brother    Breast cancer Maternal Aunt    Hyperlipidemia Neg Hx    Heart disease Neg Hx    Diabetes Neg Hx      Current Outpatient Medications:    acyclovir (ZOVIRAX)  400 MG tablet, Take 1 tablet (400 mg total) by mouth daily., Disp: 30 tablet, Rfl: 3   albuterol (PROVENTIL HFA) 108 (90 Base) MCG/ACT inhaler, Inhale 2 puffs into the lungs every 6 (six) hours as needed for wheezing or shortness of breath. , Disp: , Rfl:    allopurinol (ZYLOPRIM) 300 MG tablet, Take 1 tablet (300 mg total) by mouth 2 (two) times daily., Disp: 60 tablet, Rfl: 2   Cholecalciferol (VITAMIN D3 PO), Take 20,000 mcg by mouth daily., Disp: , Rfl:    cyclobenzaprine (FLEXERIL) 10 MG tablet, Take 1 tablet (10 mg total) by mouth 3 (three) times daily as needed for muscle spasms., Disp: 60 tablet, Rfl: 0   HYDROcodone-acetaminophen (NORCO) 10-325 MG tablet, Take 1 tablet by mouth every 6 (six) hours as needed., Disp: 90 tablet, Rfl: 0   lidocaine-prilocaine (EMLA) cream, Apply to affected area once, Disp: 30 g, Rfl: 3   LORazepam (ATIVAN) 0.5 MG tablet, Take 1 tablet (0.5 mg total) by mouth every 6  (six) hours as needed (Nausea or vomiting)., Disp: 30 tablet, Rfl: 0   Menaquinone-7 (VITAMIN K2 PO), Take 200 mcg by mouth daily., Disp: , Rfl:    Multiple Vitamin (MULTI-VITAMIN DAILY PO), Take 1 tablet by mouth daily. , Disp: , Rfl:    ondansetron (ZOFRAN) 8 MG tablet, Take 1 tablet (8 mg total) by mouth 2 (two) times daily as needed for refractory nausea / vomiting. Start on day 2 after bendamustine chemotherapy., Disp: 30 tablet, Rfl: 1   oxybutynin (DITROPAN) 5 MG tablet, Take 1 tablet (5 mg total) by mouth every 8 (eight) hours as needed for bladder spasms., Disp: 30 tablet, Rfl: 0   polyethylene glycol (MIRALAX / GLYCOLAX) 17 g packet, Take 17 g by mouth daily., Disp: , Rfl:    predniSONE (STERAPRED UNI-PAK 21 TAB) 10 MG (21) TBPK tablet, Allergic reaction, Disp: 21 tablet, Rfl: 0   Probiotic Product (PROBIOTIC PO), Take 1 capsule by mouth daily., Disp: , Rfl:    prochlorperazine (COMPAZINE) 10 MG tablet, Take 1 tablet (10 mg total) by mouth every 6 (six) hours as needed (Nausea or vomiting)., Disp: 30 tablet, Rfl: 1 No current facility-administered medications for this visit.   Facility-Administered Medications Ordered in Other Visits:    bendamustine (BENDEKA) 175 mg in sodium chloride 0.9 % 50 mL (3.0702 mg/mL) chemo infusion, 90 mg/m2 (Treatment Plan Recorded), Intravenous, Once, Sindy Guadeloupe, MD   heparin lock flush 100 unit/mL, 500 Units, Intracatheter, Once PRN, Sindy Guadeloupe, MD   palonosetron (ALOXI) injection 0.25 mg, 0.25 mg, Intravenous, Once, Sindy Guadeloupe, MD   riTUXimab (RITUXAN) 700 mg in sodium chloride 0.9 % 180 mL infusion, 375 mg/m2 (Treatment Plan Recorded), Intravenous, Once, Sindy Guadeloupe, MD  Physical exam:  Vitals:   05/18/19 0920  BP: 118/74  Pulse: (!) 114  Resp: 18  Temp: 98.3 F (36.8 C)  Weight: 186 lb 14.4 oz (84.8 kg)   Physical Exam Constitutional:      General: She is in acute distress.  HENT:     Head: Normocephalic and  atraumatic.  Eyes:     Pupils: Pupils are equal, round, and reactive to light.  Neck:     Musculoskeletal: Normal range of motion.  Cardiovascular:     Rate and Rhythm: Normal rate and regular rhythm.     Heart sounds: Normal heart sounds.  Pulmonary:     Effort: Pulmonary effort is normal.     Breath sounds: Normal breath  sounds.  Abdominal:     General: Bowel sounds are normal.     Palpations: Abdomen is soft.  Lymphadenopathy:     Comments: No palpable cervical or supraclavicular adenopathy  Skin:    General: Skin is warm and dry.  Neurological:     Mental Status: She is alert and oriented to person, place, and time.      CMP Latest Ref Rng & Units 05/18/2019  Glucose 70 - 99 mg/dL 113(H)  BUN 8 - 23 mg/dL 16  Creatinine 0.44 - 1.00 mg/dL 0.61  Sodium 135 - 145 mmol/L 140  Potassium 3.5 - 5.1 mmol/L 3.7  Chloride 98 - 111 mmol/L 105  CO2 22 - 32 mmol/L 27  Calcium 8.9 - 10.3 mg/dL 9.1  Total Protein 6.5 - 8.1 g/dL 6.7  Total Bilirubin 0.3 - 1.2 mg/dL 1.3(H)  Alkaline Phos 38 - 126 U/L 77  AST 15 - 41 U/L 20  ALT 0 - 44 U/L 21   CBC Latest Ref Rng & Units 05/18/2019  WBC 4.0 - 10.5 K/uL 6.5  Hemoglobin 12.0 - 15.0 g/dL 12.9  Hematocrit 36.0 - 46.0 % 39.3  Platelets 150 - 400 K/uL 191    No images are attached to the encounter.  Ct L-spine No Charge  Result Date: 04/23/2019 CLINICAL DATA:  Severe back pain. Known lymphoma. History of left ureteral stent. EXAM: CT LUMBAR SPINE WITHOUT CONTRAST TECHNIQUE: Multidetector CT imaging of the lumbar spine was performed without intravenous contrast administration. Multiplanar CT image reconstructions were also generated. COMPARISON:  None. FINDINGS: Segmentation: Normal Alignment: Normal Vertebrae: No bone lesions or fractures are identified. Paraspinal and other soft tissues: Again noted is bulk E left-sided predominant retroperitoneal lymphadenopathy. The major vascular structures appear normal. There is a left-sided ureteral  stent in the upper ureter. Mild left-sided hydronephrosis. Disc levels: No large disc protrusions, spinal or foraminal stenosis is demonstrated. IMPRESSION: Normal alignment of the lumbar vertebral bodies and no acute bony findings or worrisome bone lesions. The spinal canal is generous in is no significant canal or foraminal stenosis. Stable retroperitoneal lymphadenopathy. Left ureteral stent in the upper left ureter.  Mild hydronephrosis. Electronically Signed   By: Marijo Sanes M.D.   On: 04/23/2019 08:59   Dg C-arm 1-60 Min-no Report  Result Date: 04/23/2019 Fluoroscopy was utilized by the requesting physician.  No radiographic interpretation.   Ct Angio Chest/abd/pel For Dissection W And/or W/wo  Result Date: 04/23/2019 CLINICAL DATA:  Lower back pain.  Chest pain EXAM: CT ANGIOGRAPHY CHEST, ABDOMEN AND PELVIS TECHNIQUE: Multidetector CT imaging through the chest, abdomen and pelvis was performed using the standard protocol during bolus administration of intravenous contrast. Multiplanar reconstructed images and MIPs were obtained and reviewed to evaluate the vascular anatomy. CONTRAST:  156m ISOVUE-370 IOPAMIDOL (ISOVUE-370) INJECTION 76% COMPARISON:  PET CT 04/02/2019 FINDINGS: CTA CHEST FINDINGS Cardiovascular: No intramural hematoma the thoracic aorta on noncontrast phase. Negative for aortic dissection or aneurysm. No noted atheromatous changes. No pulmonary artery filling defect. Mediastinum/Nodes: Negative for thoracic adenopathy or mass Lungs/Pleura: There is no edema, consolidation, effusion, or pneumothorax. Musculoskeletal: No acute or aggressive finding Review of the MIP images confirms the above findings. CTA ABDOMEN AND PELVIS FINDINGS VASCULAR Aorta: Minimal atheromatous plaque.  No dissection or aneurysm. Celiac: Vessels are smooth and widely patent SMA: Vessels are smooth and widely patent Renals: Accessory left renal artery. Vessels are smooth and widely patent IMA: Patent Inflow:  Widely patent Veins: Negative in the arterial phase Review of the  MIP images confirms the above findings. NON-VASCULAR Hepatobiliary: Rounded hypervascular foci in inferior central liver and segment 3 not seen on most recent portal venous and delayed phase abdominal CT, possibly shunts. These measure up to 16 mm. Patient is under surveillance for lymphoma.No evidence of biliary obstruction or stone. Pancreas: Unremarkable. Spleen: Unremarkable. Adrenals/Urinary Tract: Negative adrenals. Essentially resolved left hydronephrosis. Left ureteral stent with retention loop loosely formed at the level of the upper ureter, alignment similar to 04/03/2019 scout CT. Unremarkable bladder. Stomach/Bowel: Moderate stool volume without colonic over distension. No bowel wall thickening Lymphatic: Retro crural and retroperitoneal lymphadenopathy which has decreased in bulk since 04/28/2019. nodes remain homogeneous. Reproductive:Hysterectomy Other: No ascites or pneumoperitoneum. Musculoskeletal: No acute or aggressive finding. Chronic rim enhancing fluid collection in the left subcutaneous gluteal region. Review of the MIP images confirms the above findings. IMPRESSION: 1. No evidence of acute aortic syndrome. No significant atheromatous changes or stenosis. 2. Lymphoma with interval positive treatment response. 3. Left ureteral stent with loosely formed upper retention loop at the level of the upper ureter, unchanged. 4. 2 hypervascular foci in the liver not seen on most recent PET-CT and abdominal CT, possible shunts. Attention on follow-up. Electronically Signed   By: Monte Fantasia M.D.   On: 04/23/2019 05:03     Assessment and plan- Patient is a 63 y.o. female with stage III grade 3A follicular lymphoma.  She is here for on treatment assessment prior to cycle 2-day 1 of bendamustine Rituxan chemotherapy  Counts okay to proceed with cycle 2-day 1 of bendamustine Rituxan chemotherapy today.  Her back pain has resolved at  this time and it may have been secondary to Neulasta.  I will hold off on giving Neulasta with this cycle.  She will return to clinic tomorrow to receive cycle 2-day 8 2 of Rituxan chemotherapy.  I will tentatively see her back in 2 weeks time to see how she tolerated second cycle and for possible IV fluids.  Return to clinic in 4 weeks with port labs: CBC with differential, CMP, LDH and uric acid for cycle 3-day 1 of bendamustine Rituxan chemotherapy.  I will plan to get interim PET scan after 3 cycles   Visit Diagnosis 1. Follicular lymphoma grade IIIa of intra-abdominal lymph nodes ([place])   2. Encounter for antineoplastic chemotherapy   3. Visit for monitoring Rituxan therapy      Dr. Randa Evens, MD, MPH [facility] at [facility] [phone] 05/18/2019 10:50 AM

## 2019-05-19 ENCOUNTER — Other Ambulatory Visit: Payer: Self-pay | Admitting: *Deleted

## 2019-05-19 ENCOUNTER — Other Ambulatory Visit: Payer: Self-pay

## 2019-05-19 ENCOUNTER — Telehealth: Payer: Self-pay | Admitting: *Deleted

## 2019-05-19 ENCOUNTER — Inpatient Hospital Stay: Payer: BLUE CROSS/BLUE SHIELD

## 2019-05-19 VITALS — BP 117/81 | HR 93 | Temp 98.8°F | Resp 20

## 2019-05-19 DIAGNOSIS — C8233 Follicular lymphoma grade IIIa, intra-abdominal lymph nodes: Secondary | ICD-10-CM

## 2019-05-19 DIAGNOSIS — Z5111 Encounter for antineoplastic chemotherapy: Secondary | ICD-10-CM | POA: Diagnosis not present

## 2019-05-19 MED ORDER — SODIUM CHLORIDE 0.9 % IV SOLN
Freq: Once | INTRAVENOUS | Status: AC
  Administered 2019-05-19: 14:00:00 via INTRAVENOUS
  Filled 2019-05-19: qty 250

## 2019-05-19 MED ORDER — DEXAMETHASONE SODIUM PHOSPHATE 10 MG/ML IJ SOLN
INTRAMUSCULAR | Status: AC
  Filled 2019-05-19: qty 1

## 2019-05-19 MED ORDER — HEPARIN SOD (PORK) LOCK FLUSH 100 UNIT/ML IV SOLN
500.0000 [IU] | Freq: Once | INTRAVENOUS | Status: AC | PRN
  Administered 2019-05-19: 15:00:00 500 [IU]

## 2019-05-19 MED ORDER — DEXAMETHASONE SODIUM PHOSPHATE 10 MG/ML IJ SOLN
10.0000 mg | Freq: Once | INTRAMUSCULAR | Status: AC
  Administered 2019-05-19: 14:00:00 10 mg via INTRAVENOUS

## 2019-05-19 MED ORDER — PREDNISONE 10 MG PO TABS: 10.0000 mg | ORAL_TABLET | ORAL | 0 refills | Status: DC

## 2019-05-19 MED ORDER — SODIUM CHLORIDE 0.9 % IV SOLN
90.0000 mg/m2 | Freq: Once | INTRAVENOUS | Status: AC
  Administered 2019-05-19: 15:00:00 175 mg via INTRAVENOUS
  Filled 2019-05-19: qty 7

## 2019-05-19 NOTE — Telephone Encounter (Signed)
Pt texted to say that she has rash all over body this morning. Had my first treatment of two yest. I don't think I was given oxycodone yest. But the rash is similar.  I checked with Dr. Janese Banks and she states that she will come and see pt. When she gets to infusion this afternoon. Dr. Janese Banks states that pt did not like the oxycodone and pt was given hydrocodone in exchange. Yesterday when pt was seen she did not have pain so no refills was called. In. The patient sent me a message back that she has hydrocodone and currently not needing anything

## 2019-06-01 ENCOUNTER — Inpatient Hospital Stay: Payer: BLUE CROSS/BLUE SHIELD | Admitting: Oncology

## 2019-06-01 ENCOUNTER — Inpatient Hospital Stay: Payer: BLUE CROSS/BLUE SHIELD

## 2019-06-01 ENCOUNTER — Other Ambulatory Visit: Payer: Self-pay

## 2019-06-01 ENCOUNTER — Encounter: Payer: Self-pay | Admitting: Oncology

## 2019-06-01 ENCOUNTER — Inpatient Hospital Stay: Payer: BLUE CROSS/BLUE SHIELD | Attending: Oncology | Admitting: Oncology

## 2019-06-01 ENCOUNTER — Other Ambulatory Visit: Payer: Self-pay | Admitting: *Deleted

## 2019-06-01 DIAGNOSIS — Z79899 Other long term (current) drug therapy: Secondary | ICD-10-CM | POA: Diagnosis not present

## 2019-06-01 DIAGNOSIS — R300 Dysuria: Secondary | ICD-10-CM | POA: Diagnosis not present

## 2019-06-01 DIAGNOSIS — C8233 Follicular lymphoma grade IIIa, intra-abdominal lymph nodes: Secondary | ICD-10-CM

## 2019-06-01 DIAGNOSIS — R21 Rash and other nonspecific skin eruption: Secondary | ICD-10-CM | POA: Diagnosis not present

## 2019-06-01 DIAGNOSIS — Z5111 Encounter for antineoplastic chemotherapy: Secondary | ICD-10-CM | POA: Diagnosis not present

## 2019-06-01 DIAGNOSIS — C8238 Follicular lymphoma grade IIIa, lymph nodes of multiple sites: Secondary | ICD-10-CM | POA: Insufficient documentation

## 2019-06-01 DIAGNOSIS — Z7189 Other specified counseling: Secondary | ICD-10-CM

## 2019-06-01 LAB — CBC WITH DIFFERENTIAL/PLATELET
Abs Immature Granulocytes: 0.03 10*3/uL (ref 0.00–0.07)
Basophils Absolute: 0.1 10*3/uL (ref 0.0–0.1)
Basophils Relative: 1 %
Eosinophils Absolute: 0.3 10*3/uL (ref 0.0–0.5)
Eosinophils Relative: 5 %
HCT: 39.7 % (ref 36.0–46.0)
Hemoglobin: 12.8 g/dL (ref 12.0–15.0)
Immature Granulocytes: 1 %
Lymphocytes Relative: 6 %
Lymphs Abs: 0.4 10*3/uL — ABNORMAL LOW (ref 0.7–4.0)
MCH: 28.3 pg (ref 26.0–34.0)
MCHC: 32.2 g/dL (ref 30.0–36.0)
MCV: 87.8 fL (ref 80.0–100.0)
Monocytes Absolute: 0.7 10*3/uL (ref 0.1–1.0)
Monocytes Relative: 11 %
Neutro Abs: 4.7 10*3/uL (ref 1.7–7.7)
Neutrophils Relative %: 76 %
Platelets: 302 10*3/uL (ref 150–400)
RBC: 4.52 MIL/uL (ref 3.87–5.11)
RDW: 16.5 % — ABNORMAL HIGH (ref 11.5–15.5)
WBC: 6.2 10*3/uL (ref 4.0–10.5)
nRBC: 0 % (ref 0.0–0.2)

## 2019-06-01 LAB — COMPREHENSIVE METABOLIC PANEL
ALT: 36 U/L (ref 0–44)
AST: 29 U/L (ref 15–41)
Albumin: 4.1 g/dL (ref 3.5–5.0)
Alkaline Phosphatase: 74 U/L (ref 38–126)
Anion gap: 8 (ref 5–15)
BUN: 15 mg/dL (ref 8–23)
CO2: 28 mmol/L (ref 22–32)
Calcium: 9.2 mg/dL (ref 8.9–10.3)
Chloride: 103 mmol/L (ref 98–111)
Creatinine, Ser: 0.67 mg/dL (ref 0.44–1.00)
GFR calc Af Amer: >60 mL/min (ref >60)
GFR calc non Af Amer: >60 mL/min (ref >60)
Glucose, Bld: 100 mg/dL — ABNORMAL HIGH (ref 70–99)
Potassium: 4.2 mmol/L (ref 3.5–5.1)
Sodium: 139 mmol/L (ref 135–145)
Total Bilirubin: 1.2 mg/dL (ref 0.3–1.2)
Total Protein: 6.6 g/dL (ref 6.5–8.1)

## 2019-06-01 LAB — URIC ACID: Uric Acid, Serum: 3 mg/dL (ref 2.5–7.1)

## 2019-06-01 LAB — LACTATE DEHYDROGENASE: LDH: 139 U/L (ref 98–192)

## 2019-06-01 MED ORDER — TRIAMCINOLONE ACETONIDE 0.5 % EX OINT: 1.0000 "application " | TOPICAL_OINTMENT | Freq: Two times a day (BID) | CUTANEOUS | 2 refills | Status: DC

## 2019-06-01 NOTE — Progress Notes (Signed)
I connected with Andrea Lloyd on 06/01/19 at 11:00 AM EDT by video enabled telemedicine visit and verified that I am speaking with the correct person using two identifiers.   I discussed the limitations, risks, security and privacy concerns of performing an evaluation and management service by telemedicine and the availability of in-person appointments. I also discussed with the patient that there may be a patient responsible charge related to this service. The patient expressed understanding and agreed to proceed.  Other persons participating in the visit and their role in the encounter:  none  Patient's location:  home Provider's location:  work  Risk analyst Complaint: Toxicity check after cycle 2 of bendamustine and Rituxan  History of present illness: patient is a63 year old female who was seen by [facility] for evaluation of left lower quadrant abdominal pain which has been ongoing for the last few monthsShe has a history of hysterectomy/sacral colpopexy/ vaginal mesh in 2014.   2. This was followed by a CT of the abdomen which showed: Multiple enlarged abdominal and left common iliac nodes. Findings are worrisome for either metastatic adenopathy from a abdominal primary versus lymphoproliferative disorder. Consider further investigation with PET-CT and tissue sampling.  3. PET/CT scan from 01/08/2017 showed:Hypermetabolic upper abdominal and retroperitoneal lymph nodes, measuring up to 1.9 cm short axis, worrisome for nodal metastases versus lymphoma.  Hypermetabolic 7 mm short axis node at the left thoracic inlet, worrisome for lymphatic spread via the thoracic duct.  4. Tumor markers including CEA and CA-19-9 was normal at 1 and 8 respectively.  5. She is otherwise doing well and denies any complaints of unintentional weight loss, loss of appetite, fevers or chills or drenching night sweats. Her abdominal pain is mostly associated with eating and sometimes comes on a  day later. It is mainly in her left lower quadrant and is a dull aching pain but sometimes she seems to have pain in the epigastrium and right upper quadrant  6. Patient underwent IR guided retroperitoneal LN biopsy which showed : DIAGNOSIS:  A. LYMPH NODE, LEFT RETROPERITONEAL; CT-GUIDED BIOPSY:  - FOLLICULAR LYMPHOMA, GRADE 1-2.  7. Bone marrow biopsy negative for lymphoma. Normal karyotype and cytogenetics  8.Patient underwent excisional lymph node biopsy of the left supraclavicular lymph node.Pathology showed Grdae 3A follicular lymphoma. No diffuse pattern was seen.  Patient was started on bendamustine Rituxan chemotherapy on 04/17/2019  Interval history: Patient did not receive any Neulasta with cycle 2 of bendamustine and Rituxan and she did not have any significant chest wall or back pain after the infusion.  She has occasional dull aching self-limited flank pain which has been ongoing since the start of [place].  She also reports of pain skin rash which is patchy and comes and goes in different parts of her body and is quite itchy.  Reports that she has intermittent burning sensation at the end of urination.  Denies any frequency hesitancy or fevers   Review of Systems  Constitutional: Negative for chills, fever, malaise/fatigue and weight loss.  HENT: Negative for congestion, ear discharge and nosebleeds.   Eyes: Negative for blurred vision.  Respiratory: Negative for cough, hemoptysis, sputum production, shortness of breath and wheezing.   Cardiovascular: Negative for chest pain, palpitations, orthopnea and claudication.  Gastrointestinal: Negative for abdominal pain, blood in stool, constipation, diarrhea, heartburn, melena, nausea and vomiting.  Genitourinary: Negative for dysuria, flank pain, frequency, hematuria and urgency.       Burning urination  Musculoskeletal: Negative for back pain, joint pain and myalgias.  Skin: Positive for rash.  Neurological: Negative for  dizziness, tingling, focal weakness, seizures, weakness and headaches.  Endo/Heme/Allergies: Does not bruise/bleed easily.  Psychiatric/Behavioral: Negative for depression and suicidal ideas. The patient does not have insomnia.     Allergies  Allergen Reactions  . Oxycodone Hives  . Adhesive [Tape] Rash    Bandaids    Past Medical History:  Diagnosis Date  . Asthma   . Family history of adverse reaction to anesthesia    Father - slow to wake  . GERD (gastroesophageal reflux disease)   . Gravida 4 para 4   . Lymphoma [facility])     Past Surgical History:  Procedure Laterality Date  . ABDOMINAL HYSTERECTOMY     06 01 2013  . BLADDER SURGERY    . COLONOSCOPY WITH PROPOFOL N/A 02/25/2017   Procedure: COLONOSCOPY WITH PROPOFOL;  Surgeon: Lucilla Lame, MD;  Location: [place];  Service: Endoscopy;  Laterality: N/A;  . CYSTOSCOPY W/ URETERAL STENT PLACEMENT Left 03/28/2019   Procedure: CYSTOSCOPY WITH RETROGRADE PYELOGRAM/URETERAL STENT PLACEMENT;  Surgeon: Abbie Sons, MD;  Location: [facility];  Service: Urology;  Laterality: Left;  . CYSTOSCOPY WITH STENT PLACEMENT Left 04/23/2019   Procedure: CYSTOSCOPY WITH LEFT STENT EXCHANGE;  Surgeon: Abbie Sons, MD;  Location: [facility];  Service: Urology;  Laterality: Left;  . IR IMAGING GUIDED PORT INSERTION  04/06/2019  . MASS BIOPSY Left 04/10/2019   Procedure: NECK MASS BIOPSY LEFT;  Surgeon: Jules Husbands, MD;  Location: [facility];  Service: General;  Laterality: Left;  . POLYPECTOMY  02/25/2017   Procedure: POLYPECTOMY;  Surgeon: Lucilla Lame, MD;  Location: [place];  Service: Endoscopy;;  . PORT A CATH INJECTION ([place] HX)    . ROTATOR CUFF REPAIR Right   . TONSILLECTOMY  1972    Social History   Socioeconomic History  . Marital status: Married    Spouse name: Not on file  . Number of children: Not on file  . Years of education: Not on file  . Highest education level: Not on file  Occupational History  .  Not on file  Social Needs  . Financial resource strain: Not on file  . Food insecurity    Worry: Not on file    Inability: Not on file  . Transportation needs    Medical: Not on file    Non-medical: Not on file  Tobacco Use  . Smoking status: Never Smoker  . Smokeless tobacco: Never Used  Substance and Sexual Activity  . Alcohol use: No  . Drug use: No  . Sexual activity: Yes  Lifestyle  . Physical activity    Days per week: Not on file    Minutes per session: Not on file  . Stress: Not on file  Relationships  . Social Herbalist on phone: Not on file    Gets together: Not on file    Attends religious service: Not on file    Active member of club or organization: Not on file    Attends meetings of clubs or organizations: Not on file    Relationship status: Not on file  . Intimate partner violence    Fear of current or ex partner: Not on file    Emotionally abused: Not on file    Physically abused: Not on file    Forced sexual activity: Not on file  Other Topics Concern  . Not on file  Social History Narrative  . Not  on file    Family History  Problem Relation Age of Onset  . Alcohol abuse Maternal Grandfather   . Healthy Mother   . Cancer Father   . Healthy Sister   . Healthy Brother   . Breast cancer Maternal Aunt   . Hyperlipidemia Neg Hx   . Heart disease Neg Hx   . Diabetes Neg Hx      Current Outpatient Medications:  .  acyclovir (ZOVIRAX) 400 MG tablet, Take 1 tablet (400 mg total) by mouth daily., Disp: 30 tablet, Rfl: 3 .  albuterol (PROVENTIL HFA) 108 (90 Base) MCG/ACT inhaler, Inhale 2 puffs into the lungs every 6 (six) hours as needed for wheezing or shortness of breath. , Disp: , Rfl:  .  allopurinol (ZYLOPRIM) 300 MG tablet, Take 1 tablet (300 mg total) by mouth 2 (two) times daily., Disp: 60 tablet, Rfl: 2 .  Cholecalciferol (VITAMIN D3 PO), Take 20,000 mcg by mouth daily., Disp: , Rfl:  .  lidocaine-prilocaine (EMLA) cream, Apply to  affected area once, Disp: 30 g, Rfl: 3 .  Menaquinone-7 (VITAMIN K2 PO), Take 200 mcg by mouth daily., Disp: , Rfl:  .  Multiple Vitamin (MULTI-VITAMIN DAILY PO), Take 1 tablet by mouth daily. , Disp: , Rfl:  .  oxybutynin (DITROPAN) 5 MG tablet, Take 1 tablet (5 mg total) by mouth every 8 (eight) hours as needed for bladder spasms., Disp: 30 tablet, Rfl: 0 .  polyethylene glycol (MIRALAX / GLYCOLAX) 17 g packet, Take 17 g by mouth daily., Disp: , Rfl:  .  Probiotic Product (PROBIOTIC PO), Take 1 capsule by mouth daily., Disp: , Rfl:  .  cyclobenzaprine (FLEXERIL) 10 MG tablet, Take 1 tablet (10 mg total) by mouth 3 (three) times daily as needed for muscle spasms. (Patient not taking: Reported on 06/01/2019), Disp: 60 tablet, Rfl: 0 .  HYDROcodone-acetaminophen (NORCO) 10-325 MG tablet, Take 1 tablet by mouth every 6 (six) hours as needed. (Patient not taking: Reported on 06/01/2019), Disp: 90 tablet, Rfl: 0 .  LORazepam (ATIVAN) 0.5 MG tablet, Take 1 tablet (0.5 mg total) by mouth every 6 (six) hours as needed (Nausea or vomiting). (Patient not taking: Reported on 06/01/2019), Disp: 30 tablet, Rfl: 0 .  ondansetron (ZOFRAN) 8 MG tablet, Take 1 tablet (8 mg total) by mouth 2 (two) times daily as needed for refractory nausea / vomiting. Start on day 2 after bendamustine chemotherapy. (Patient not taking: Reported on 06/01/2019), Disp: 30 tablet, Rfl: 1 .  predniSONE (DELTASONE) 10 MG tablet, Take 1 tablet (10 mg total) by mouth as directed. start with 5 pills day 1, 4 pills day 2, 3 pills day 3, 2 pills day 4 and day 5 1 pill with breakfast each day (Patient not taking: Reported on 06/01/2019), Disp: 15 tablet, Rfl: 0 .  prochlorperazine (COMPAZINE) 10 MG tablet, Take 1 tablet (10 mg total) by mouth every 6 (six) hours as needed (Nausea or vomiting). (Patient not taking: Reported on 06/01/2019), Disp: 30 tablet, Rfl: 1  No results found.  No images are attached to the encounter.   CMP Latest Ref Rng & Units  06/01/2019  Glucose 70 - 99 mg/dL 100(H)  BUN 8 - 23 mg/dL 15  Creatinine 0.44 - 1.00 mg/dL 0.67  Sodium 135 - 145 mmol/L 139  Potassium 3.5 - 5.1 mmol/L 4.2  Chloride 98 - 111 mmol/L 103  CO2 22 - 32 mmol/L 28  Calcium 8.9 - 10.3 mg/dL 9.2  Total Protein 6.5 - 8.1  g/dL 6.6  Total Bilirubin 0.3 - 1.2 mg/dL 1.2  Alkaline Phos 38 - 126 U/L 74  AST 15 - 41 U/L 29  ALT 0 - 44 U/L 36   CBC Latest Ref Rng & Units 06/01/2019  WBC 4.0 - 10.5 K/uL 6.2  Hemoglobin 12.0 - 15.0 g/dL 12.8  Hematocrit 36.0 - 46.0 % 39.7  Platelets 150 - 400 K/uL 302     Observation/objective: Appears in no acute distress of a video visit today.  Breathing is nonlabored  Assessment and plan: Patient is a 63 year old female with stage III grade 3A follicular lymphoma status post 2 cycles of bendamustine and Rituxan for toxicity check post cycle 2 of treatment  1.  Patient has tolerated her cycle 2 of bendamustine Rituxan well.  Today her CBC with differential, CMP and LDH are within normal limits.  I will see her back in 2 weeks time on 06/15/2019 with CBC with differential, CMP and uric acid for cycle 3-day 1 of bendamustine and Rituxan and she will get Rituxan alone on 06/16/2019.  2.  Skin rash: Etiology unclear.  I will prescribe her topical triamcinolone given that her rashes intermittent and patchy.  I have also asked her to stop her allopurinol which in some cases can cause itching.  Her uric acid is currently normal.  3.  Burning urination: I have asked her to drop off a urine sample at her convenience if she has persistent symptoms.  Otherwise I will reevaluate the symptoms at my next visit.  This could also be secondary to her stent placement.  PET CT scan 4 weeks from now  Follow-up instructions: See above  I discussed the assessment and treatment plan with the patient. The patient was provided an opportunity to ask questions and all were answered. The patient agreed with the plan and demonstrated an  understanding of the instructions.   The patient was advised to call back or seek an in-person evaluation if the symptoms worsen or if the condition fails to improve as anticipated.    Visit Diagnosis: 1. Follicular lymphoma grade IIIa of intra-abdominal lymph nodes ([place])   2. Skin rash   3. Burning with urination     Dr. Randa Evens, MD, MPH [facility] Pager- [phone] 06/01/2019 11:26 AM

## 2019-06-01 NOTE — Progress Notes (Signed)
Pt states that rash is coming and then goes away. It is starting to be itchy. She states there are times that she feels a little stinging sensation at the end of her urination-she does not feel that is it a UTI and it does not happen every day and when it does happen it does not happen every time  During the day

## 2019-06-12 ENCOUNTER — Telehealth: Payer: Self-pay | Admitting: *Deleted

## 2019-06-12 ENCOUNTER — Other Ambulatory Visit: Payer: Self-pay | Admitting: Oncology

## 2019-06-12 ENCOUNTER — Other Ambulatory Visit: Payer: Self-pay

## 2019-06-12 ENCOUNTER — Other Ambulatory Visit: Payer: Self-pay | Admitting: *Deleted

## 2019-06-12 ENCOUNTER — Encounter: Payer: Self-pay | Admitting: Oncology

## 2019-06-12 ENCOUNTER — Inpatient Hospital Stay: Payer: BLUE CROSS/BLUE SHIELD

## 2019-06-12 DIAGNOSIS — Z5111 Encounter for antineoplastic chemotherapy: Secondary | ICD-10-CM | POA: Diagnosis not present

## 2019-06-12 DIAGNOSIS — R300 Dysuria: Secondary | ICD-10-CM

## 2019-06-12 LAB — URINALYSIS, COMPLETE (UACMP) WITH MICROSCOPIC
Bacteria, UA: NONE SEEN
Bilirubin Urine: NEGATIVE
Glucose, UA: NEGATIVE mg/dL
Ketones, ur: NEGATIVE mg/dL
Nitrite: NEGATIVE
Protein, ur: 30 mg/dL — AB
RBC / HPF: >50 RBC/hpf — ABNORMAL HIGH (ref 0–5)
Specific Gravity, Urine: 1.013 (ref 1.005–1.030)
pH: 6 (ref 5.0–8.0)

## 2019-06-12 MED ORDER — SULFAMETHOXAZOLE-TRIMETHOPRIM 800-160 MG PO TABS: 1.0000 | ORAL_TABLET | Freq: Two times a day (BID) | ORAL | 0 refills | Status: AC

## 2019-06-12 NOTE — Telephone Encounter (Signed)
Pt sent text to me that she is having burning with urination. It happens about once a week and hurts through out the day and then gets better. Dr. Janese Banks had suggested for her to get urine sample and she wanted to wait for Monday when she comes for chemo. Per Dr. Janese Banks she would like her to come today because if it gets worse this weekend then she will have to go to ER and we prefer that she not go there while she is on chemo if we can help it and getting sample today would be best. She will come now to get the sample

## 2019-06-12 NOTE — Progress Notes (Signed)
u

## 2019-06-14 LAB — URINE CULTURE: Culture: >=100000 — AB

## 2019-06-15 ENCOUNTER — Encounter: Payer: Self-pay | Admitting: Oncology

## 2019-06-15 ENCOUNTER — Inpatient Hospital Stay (HOSPITAL_BASED_OUTPATIENT_CLINIC_OR_DEPARTMENT_OTHER): Payer: BLUE CROSS/BLUE SHIELD | Admitting: Oncology

## 2019-06-15 ENCOUNTER — Inpatient Hospital Stay: Payer: BLUE CROSS/BLUE SHIELD

## 2019-06-15 ENCOUNTER — Other Ambulatory Visit: Payer: Self-pay

## 2019-06-15 VITALS — BP 130/87 | HR 91 | Temp 98.1°F | Ht 65.0 in | Wt 192.0 lb

## 2019-06-15 DIAGNOSIS — Z5181 Encounter for therapeutic drug level monitoring: Secondary | ICD-10-CM

## 2019-06-15 DIAGNOSIS — C8233 Follicular lymphoma grade IIIa, intra-abdominal lymph nodes: Secondary | ICD-10-CM | POA: Diagnosis not present

## 2019-06-15 DIAGNOSIS — Z79899 Other long term (current) drug therapy: Secondary | ICD-10-CM

## 2019-06-15 DIAGNOSIS — Z5111 Encounter for antineoplastic chemotherapy: Secondary | ICD-10-CM

## 2019-06-15 DIAGNOSIS — R300 Dysuria: Secondary | ICD-10-CM | POA: Diagnosis not present

## 2019-06-15 LAB — CBC WITH DIFFERENTIAL/PLATELET
Abs Immature Granulocytes: 0.02 10*3/uL (ref 0.00–0.07)
Basophils Absolute: 0.1 10*3/uL (ref 0.0–0.1)
Basophils Relative: 1 %
Eosinophils Absolute: 0.5 10*3/uL (ref 0.0–0.5)
Eosinophils Relative: 8 %
HCT: 37.7 % (ref 36.0–46.0)
Hemoglobin: 12.4 g/dL (ref 12.0–15.0)
Immature Granulocytes: 0 %
Lymphocytes Relative: 10 %
Lymphs Abs: 0.6 10*3/uL — ABNORMAL LOW (ref 0.7–4.0)
MCH: 29 pg (ref 26.0–34.0)
MCHC: 32.9 g/dL (ref 30.0–36.0)
MCV: 88.1 fL (ref 80.0–100.0)
Monocytes Absolute: 0.6 10*3/uL (ref 0.1–1.0)
Monocytes Relative: 11 %
Neutro Abs: 3.8 10*3/uL (ref 1.7–7.7)
Neutrophils Relative %: 70 %
Platelets: 219 10*3/uL (ref 150–400)
RBC: 4.28 MIL/uL (ref 3.87–5.11)
RDW: 15.9 % — ABNORMAL HIGH (ref 11.5–15.5)
WBC: 5.5 10*3/uL (ref 4.0–10.5)
nRBC: 0 % (ref 0.0–0.2)

## 2019-06-15 LAB — COMPREHENSIVE METABOLIC PANEL
ALT: 26 U/L (ref 0–44)
AST: 22 U/L (ref 15–41)
Albumin: 4 g/dL (ref 3.5–5.0)
Alkaline Phosphatase: 73 U/L (ref 38–126)
Anion gap: 8 (ref 5–15)
BUN: 13 mg/dL (ref 8–23)
CO2: 25 mmol/L (ref 22–32)
Calcium: 9.2 mg/dL (ref 8.9–10.3)
Chloride: 108 mmol/L (ref 98–111)
Creatinine, Ser: 0.76 mg/dL (ref 0.44–1.00)
GFR calc Af Amer: >60 mL/min (ref >60)
GFR calc non Af Amer: >60 mL/min (ref >60)
Glucose, Bld: 93 mg/dL (ref 70–99)
Potassium: 3.9 mmol/L (ref 3.5–5.1)
Sodium: 141 mmol/L (ref 135–145)
Total Bilirubin: 0.9 mg/dL (ref 0.3–1.2)
Total Protein: 6.5 g/dL (ref 6.5–8.1)

## 2019-06-15 MED ORDER — DEXAMETHASONE SODIUM PHOSPHATE 10 MG/ML IJ SOLN
10.0000 mg | Freq: Once | INTRAMUSCULAR | Status: AC
  Administered 2019-06-15: 10 mg via INTRAVENOUS
  Filled 2019-06-15: qty 1

## 2019-06-15 MED ORDER — ACETAMINOPHEN 325 MG PO TABS
650.0000 mg | ORAL_TABLET | Freq: Once | ORAL | Status: AC
  Administered 2019-06-15: 650 mg via ORAL
  Filled 2019-06-15: qty 2

## 2019-06-15 MED ORDER — SODIUM CHLORIDE 0.9% FLUSH
10.0000 mL | Freq: Once | INTRAVENOUS | Status: AC
  Administered 2019-06-15: 10 mL via INTRAVENOUS
  Filled 2019-06-15: qty 10

## 2019-06-15 MED ORDER — PALONOSETRON HCL INJECTION 0.25 MG/5ML
0.2500 mg | Freq: Once | INTRAVENOUS | Status: AC
  Administered 2019-06-15: 0.25 mg via INTRAVENOUS

## 2019-06-15 MED ORDER — SODIUM CHLORIDE 0.9 % IV SOLN
Freq: Once | INTRAVENOUS | Status: AC
  Administered 2019-06-15: 10:00:00 via INTRAVENOUS
  Filled 2019-06-15: qty 180

## 2019-06-15 MED ORDER — SODIUM CHLORIDE 0.9 % IV SOLN
Freq: Once | INTRAVENOUS | Status: AC
  Administered 2019-06-15: 09:00:00 via INTRAVENOUS
  Filled 2019-06-15: qty 250

## 2019-06-15 MED ORDER — DIPHENHYDRAMINE HCL 25 MG PO CAPS
50.0000 mg | ORAL_CAPSULE | Freq: Once | ORAL | Status: AC
  Administered 2019-06-15: 10:00:00 50 mg via ORAL
  Filled 2019-06-15: qty 2

## 2019-06-15 MED ORDER — SODIUM CHLORIDE 0.9 % IV SOLN
90.0000 mg/m2 | Freq: Once | INTRAVENOUS | Status: AC
  Administered 2019-06-15: 175 mg via INTRAVENOUS
  Filled 2019-06-15: qty 7

## 2019-06-15 MED ORDER — HEPARIN SOD (PORK) LOCK FLUSH 100 UNIT/ML IV SOLN
500.0000 [IU] | Freq: Once | INTRAVENOUS | Status: AC
  Administered 2019-06-15: 500 [IU] via INTRAVENOUS

## 2019-06-15 NOTE — Progress Notes (Signed)
Patien reports slight headache, and pain from sten that she rates1/2 on pain scale

## 2019-06-15 NOTE — Progress Notes (Signed)
Hematology/Oncology Consult note [facility]  Telephone:([phone] Fax:([phone]  Patient Care Team: Patient, [name] as PCP - General (General Practice)   Name of the patient: Andrea Lloyd  [id number]  10/08/1956   Date of visit: 06/15/19  Diagnosis- stage III grade 3A follicular lymphoma  Chief complaint/ Reason for visit-on treatment assessment prior to cycle 3-day 1 of bendamustine and Rituxan  Heme/Onc history: patient is a63 year old female who was seen by [facility] for evaluation of left lower quadrant abdominal pain which has been ongoing for the last few monthsShe has a history of hysterectomy/sacral colpopexy/ vaginal mesh in 2014.   2. This was followed by a CT of the abdomen which showed: Multiple enlarged abdominal and left common iliac nodes. Findings are worrisome for either metastatic adenopathy from a abdominal primary versus lymphoproliferative disorder. Consider further investigation with PET-CT and tissue sampling.  3. PET/CT scan from 01/08/2017 showed:Hypermetabolic upper abdominal and retroperitoneal lymph nodes, measuring up to 1.9 cm short axis, worrisome for nodal metastases versus lymphoma.  Hypermetabolic 7 mm short axis node at the left thoracic inlet, worrisome for lymphatic spread via the thoracic duct.  4. Tumor markers including CEA and CA-19-9 was normal at 1 and 8 respectively.  5. She is otherwise doing well and denies any complaints of unintentional weight loss, loss of appetite, fevers or chills or drenching night sweats. Her abdominal pain is mostly associated with eating and sometimes comes on a day later. It is mainly in her left lower quadrant and is a dull aching pain but sometimes she seems to have pain in the epigastrium and right upper quadrant  6. Patient underwent IR guided retroperitoneal LN biopsy which showed : DIAGNOSIS:  A. LYMPH NODE, LEFT RETROPERITONEAL; CT-GUIDED  BIOPSY:  - FOLLICULAR LYMPHOMA, GRADE 1-2.  7. Bone marrow biopsy negative for lymphoma. Normal karyotype and cytogenetics  8.Patient underwent excisional lymph node biopsy of the left supraclavicular lymph node.Pathology showed Grdae 3A follicular lymphoma. No diffuse pattern was seen.  Patient was started on bendamustine Rituxan chemotherapy on 04/17/2019   Interval history-patient reports pain in her suprapubic area after she has finished voiding.  Denies any fever nausea vomiting or abdominal pain.  Skin rash has resolved  ECOG PS- 1 Pain scale- 0 Opioid associated constipation- no  Review of systems- Review of Systems  Constitutional: Negative for chills, fever, malaise/fatigue and weight loss.  HENT: Negative for congestion, ear discharge and nosebleeds.   Eyes: Negative for blurred vision.  Respiratory: Negative for cough, hemoptysis, sputum production, shortness of breath and wheezing.   Cardiovascular: Negative for chest pain, palpitations, orthopnea and claudication.  Gastrointestinal: Negative for abdominal pain, blood in stool, constipation, diarrhea, heartburn, melena, nausea and vomiting.  Genitourinary: Positive for dysuria. Negative for flank pain, frequency, hematuria and urgency.  Musculoskeletal: Negative for back pain, joint pain and myalgias.  Skin: Negative for rash.  Neurological: Negative for dizziness, tingling, focal weakness, seizures, weakness and headaches.  Endo/Heme/Allergies: Does not bruise/bleed easily.  Psychiatric/Behavioral: Negative for depression and suicidal ideas. The patient does not have insomnia.        Allergies  Allergen Reactions  . Oxycodone Hives  . Adhesive [Tape] Rash    Bandaids     Past Medical History:  Diagnosis Date  . Asthma   . Family history of adverse reaction to anesthesia    Father - slow to wake  . GERD (gastroesophageal reflux disease)   . Gravida 4 para 4   .  Lymphoma [facility])      Past Surgical  History:  Procedure Laterality Date  . ABDOMINAL HYSTERECTOMY     06 01 2013  . BLADDER SURGERY    . COLONOSCOPY WITH PROPOFOL N/A 02/25/2017   Procedure: COLONOSCOPY WITH PROPOFOL;  Surgeon: Lucilla Lame, MD;  Location: [facility];  Service: Endoscopy;  Laterality: N/A;  . CYSTOSCOPY W/ URETERAL STENT PLACEMENT Left 03/28/2019   Procedure: CYSTOSCOPY WITH RETROGRADE PYELOGRAM/URETERAL STENT PLACEMENT;  Surgeon: Abbie Sons, MD;  Location: [facility];  Service: Urology;  Laterality: Left;  . CYSTOSCOPY WITH STENT PLACEMENT Left 04/23/2019   Procedure: CYSTOSCOPY WITH LEFT STENT EXCHANGE;  Surgeon: Abbie Sons, MD;  Location: [facility];  Service: Urology;  Laterality: Left;  . IR IMAGING GUIDED PORT INSERTION  04/06/2019  . MASS BIOPSY Left 04/10/2019   Procedure: NECK MASS BIOPSY LEFT;  Surgeon: Jules Husbands, MD;  Location: [facility];  Service: General;  Laterality: Left;  . POLYPECTOMY  02/25/2017   Procedure: POLYPECTOMY;  Surgeon: Lucilla Lame, MD;  Location: [facility];  Service: Endoscopy;;  . PORT A CATH INJECTION ([place] HX)    . ROTATOR CUFF REPAIR Right   . TONSILLECTOMY  1972    Social History   Socioeconomic History  . Marital status: Married    Spouse name: Not on file  . Number of children: Not on file  . Years of education: Not on file  . Highest education level: Not on file  Occupational History  . Not on file  Social Needs  . Financial resource strain: Not on file  . Food insecurity    Worry: Not on file    Inability: Not on file  . Transportation needs    Medical: Not on file    Non-medical: Not on file  Tobacco Use  . Smoking status: Never Smoker  . Smokeless tobacco: Never Used  Substance and Sexual Activity  . Alcohol use: No  . Drug use: No  . Sexual activity: Yes  Lifestyle  . Physical activity    Days per week: Not on file    Minutes per session: Not on file  . Stress: Not on file  Relationships  . Social Herbalist  on phone: Not on file    Gets together: Not on file    Attends religious service: Not on file    Active member of club or organization: Not on file    Attends meetings of clubs or organizations: Not on file    Relationship status: Not on file  . Intimate partner violence    Fear of current or ex partner: Not on file    Emotionally abused: Not on file    Physically abused: Not on file    Forced sexual activity: Not on file  Other Topics Concern  . Not on file  Social History Narrative  . Not on file    Family History  Problem Relation Age of Onset  . Alcohol abuse Maternal Grandfather   . Healthy Mother   . Cancer Father   . Healthy Sister   . Healthy Brother   . Breast cancer Maternal Aunt   . Hyperlipidemia Neg Hx   . Heart disease Neg Hx   . Diabetes Neg Hx      Current Outpatient Medications:  .  acyclovir (ZOVIRAX) 400 MG tablet, Take 1 tablet (400 mg total) by mouth daily., Disp: 30 tablet, Rfl: 3 .  albuterol (PROVENTIL HFA) 108 (90  Base) MCG/ACT inhaler, Inhale 2 puffs into the lungs every 6 (six) hours as needed for wheezing or shortness of breath. , Disp: , Rfl:  .  allopurinol (ZYLOPRIM) 300 MG tablet, Take 1 tablet (300 mg total) by mouth 2 (two) times daily., Disp: 60 tablet, Rfl: 2 .  Cholecalciferol (VITAMIN D3 PO), Take 20,000 mcg by mouth daily., Disp: , Rfl:  .  cyclobenzaprine (FLEXERIL) 10 MG tablet, Take 1 tablet (10 mg total) by mouth 3 (three) times daily as needed for muscle spasms. (Patient not taking: Reported on 06/01/2019), Disp: 60 tablet, Rfl: 0 .  HYDROcodone-acetaminophen (NORCO) 10-325 MG tablet, Take 1 tablet by mouth every 6 (six) hours as needed. (Patient not taking: Reported on 06/01/2019), Disp: 90 tablet, Rfl: 0 .  lidocaine-prilocaine (EMLA) cream, Apply to affected area once, Disp: 30 g, Rfl: 3 .  LORazepam (ATIVAN) 0.5 MG tablet, Take 1 tablet (0.5 mg total) by mouth every 6 (six) hours as needed (Nausea or vomiting). (Patient not taking:  Reported on 06/01/2019), Disp: 30 tablet, Rfl: 0 .  Menaquinone-7 (VITAMIN K2 PO), Take 200 mcg by mouth daily., Disp: , Rfl:  .  Multiple Vitamin (MULTI-VITAMIN DAILY PO), Take 1 tablet by mouth daily. , Disp: , Rfl:  .  ondansetron (ZOFRAN) 8 MG tablet, Take 1 tablet (8 mg total) by mouth 2 (two) times daily as needed for refractory nausea / vomiting. Start on day 2 after bendamustine chemotherapy. (Patient not taking: Reported on 06/01/2019), Disp: 30 tablet, Rfl: 1 .  oxybutynin (DITROPAN) 5 MG tablet, Take 1 tablet (5 mg total) by mouth every 8 (eight) hours as needed for bladder spasms., Disp: 30 tablet, Rfl: 0 .  polyethylene glycol (MIRALAX / GLYCOLAX) 17 g packet, Take 17 g by mouth daily., Disp: , Rfl:  .  predniSONE (DELTASONE) 10 MG tablet, Take 1 tablet (10 mg total) by mouth as directed. start with 5 pills day 1, 4 pills day 2, 3 pills day 3, 2 pills day 4 and day 5 1 pill with breakfast each day (Patient not taking: Reported on 06/01/2019), Disp: 15 tablet, Rfl: 0 .  Probiotic Product (PROBIOTIC PO), Take 1 capsule by mouth daily., Disp: , Rfl:  .  prochlorperazine (COMPAZINE) 10 MG tablet, Take 1 tablet (10 mg total) by mouth every 6 (six) hours as needed (Nausea or vomiting). (Patient not taking: Reported on 06/01/2019), Disp: 30 tablet, Rfl: 1 .  sulfamethoxazole-trimethoprim (BACTRIM DS) 800-160 MG tablet, Take 1 tablet by mouth 2 (two) times daily for 7 days., Disp: 14 tablet, Rfl: 0 .  triamcinolone ointment (KENALOG) 0.5 %, Apply 1 application topically 2 (two) times daily., Disp: 30 g, Rfl: 2  Physical exam:  Vitals:   06/15/19 0856  BP: 130/87  Pulse: 91  Temp: 98.1 F (36.7 C)  Weight: 192 lb (87.1 kg)  Height: '5\' 5"'  (1.651 m)   Physical Exam HENT:     Head: Normocephalic and atraumatic.  Eyes:     Pupils: Pupils are equal, round, and reactive to light.  Neck:     Musculoskeletal: Normal range of motion.  Cardiovascular:     Rate and Rhythm: Normal rate and regular  rhythm.     Heart sounds: Normal heart sounds.  Pulmonary:     Effort: Pulmonary effort is normal.     Breath sounds: Normal breath sounds.  Abdominal:     General: Bowel sounds are normal.     Palpations: Abdomen is soft.  Lymphadenopathy:  Comments: No palpable cervical, supraclavicular, axillary or inguinal adenopathy   Skin:    General: Skin is warm and dry.  Neurological:     Mental Status: She is alert and oriented to person, place, and time.      CMP Latest Ref Rng & Units 06/15/2019  Glucose 70 - 99 mg/dL 93  BUN 8 - 23 mg/dL 13  Creatinine 0.44 - 1.00 mg/dL 0.76  Sodium 135 - 145 mmol/L 141  Potassium 3.5 - 5.1 mmol/L 3.9  Chloride 98 - 111 mmol/L 108  CO2 22 - 32 mmol/L 25  Calcium 8.9 - 10.3 mg/dL 9.2  Total Protein 6.5 - 8.1 g/dL 6.5  Total Bilirubin 0.3 - 1.2 mg/dL 0.9  Alkaline Phos 38 - 126 U/L 73  AST 15 - 41 U/L 22  ALT 0 - 44 U/L 26   CBC Latest Ref Rng & Units 06/15/2019  WBC 4.0 - 10.5 K/uL 5.5  Hemoglobin 12.0 - 15.0 g/dL 12.4  Hematocrit 36.0 - 46.0 % 37.7  Platelets 150 - 400 K/uL 219      Assessment and plan- Patient is a 63 y.o. female with stage III grade 3A follicular lymphoma here for on treatment assessment prior to cycle 3-day 1 of bendamustine and Rituxan  1.  Counts okay to proceed with cycle 3-day 1 of bendamustine Rituxan today and bendamustine tomorrow.  I will plan to get interim PET CT scan after 3 cycles.  2.  Skin rash: Possibly secondary to allopurinol.  Her uric acid is remained normal and I have asked her to discontinue the allopurinol at this time.  3.  Patient does complain of pain in her bladder area after she has completed urination.  It is possible that this is secondary to her stent.  We did check a UA which was positive but urine culture showed multiple species.  We prescribed her empiric Bactrim after urine analysis was positive and she is currently on day 3.  She still has some ongoing symptoms and I will ask her to  repeat her urinalysis and urine culture today or tomorrow.  I will see her back in 4 weeks time with port labs CBC with differential, CMP for cycle 4-day 1 of bendamustine Rituxan   Visit Diagnosis 1. Follicular lymphoma grade IIIa of intra-abdominal lymph nodes ([place])   2. Encounter for antineoplastic chemotherapy   3. Encounter for monitoring rituximab therapy      Dr. Randa Evens, MD, MPH [facility] at [facility] [phone] 06/15/2019 9:23 AM

## 2019-06-16 ENCOUNTER — Inpatient Hospital Stay: Payer: BLUE CROSS/BLUE SHIELD

## 2019-06-16 ENCOUNTER — Other Ambulatory Visit: Payer: Self-pay

## 2019-06-16 VITALS — BP 114/77 | HR 90 | Temp 98.7°F | Resp 20

## 2019-06-16 DIAGNOSIS — C8233 Follicular lymphoma grade IIIa, intra-abdominal lymph nodes: Secondary | ICD-10-CM

## 2019-06-16 DIAGNOSIS — Z5111 Encounter for antineoplastic chemotherapy: Secondary | ICD-10-CM | POA: Diagnosis not present

## 2019-06-16 DIAGNOSIS — R300 Dysuria: Secondary | ICD-10-CM

## 2019-06-16 LAB — URINALYSIS, COMPLETE (UACMP) WITH MICROSCOPIC
Bilirubin Urine: NEGATIVE
Glucose, UA: NEGATIVE mg/dL
Ketones, ur: NEGATIVE mg/dL
Nitrite: NEGATIVE
Protein, ur: 30 mg/dL — AB
RBC / HPF: >50 RBC/hpf — ABNORMAL HIGH (ref 0–5)
Specific Gravity, Urine: 1.015 (ref 1.005–1.030)
pH: 6 (ref 5.0–8.0)

## 2019-06-16 MED ORDER — SODIUM CHLORIDE 0.9% FLUSH
10.0000 mL | INTRAVENOUS | Status: DC | PRN
  Administered 2019-06-16: 10 mL
  Filled 2019-06-16: qty 10

## 2019-06-16 MED ORDER — HEPARIN SOD (PORK) LOCK FLUSH 100 UNIT/ML IV SOLN
500.0000 [IU] | Freq: Once | INTRAVENOUS | Status: DC | PRN
  Filled 2019-06-16: qty 5

## 2019-06-16 MED ORDER — SODIUM CHLORIDE 0.9 % IV SOLN
90.0000 mg/m2 | Freq: Once | INTRAVENOUS | Status: AC
  Administered 2019-06-16: 175 mg via INTRAVENOUS
  Filled 2019-06-16: qty 7

## 2019-06-16 MED ORDER — DEXAMETHASONE SODIUM PHOSPHATE 10 MG/ML IJ SOLN
10.0000 mg | Freq: Once | INTRAMUSCULAR | Status: AC
  Administered 2019-06-16: 10:00:00 10 mg via INTRAVENOUS
  Filled 2019-06-16: qty 1

## 2019-06-16 MED ORDER — SODIUM CHLORIDE 0.9 % IV SOLN
Freq: Once | INTRAVENOUS | Status: AC
  Administered 2019-06-16: 10:00:00 via INTRAVENOUS
  Filled 2019-06-16: qty 250

## 2019-06-17 LAB — URINE CULTURE: Culture: NO GROWTH

## 2019-06-28 ENCOUNTER — Other Ambulatory Visit: Payer: Self-pay | Admitting: Oncology

## 2019-06-29 ENCOUNTER — Other Ambulatory Visit: Payer: Self-pay

## 2019-06-29 ENCOUNTER — Encounter
Admission: RE | Admit: 2019-06-29 | Discharge: 2019-06-29 | Disposition: A | Discharge status: Home / Self Care | Payer: BLUE CROSS/BLUE SHIELD | Source: Ambulatory Visit | Attending: Oncology | Admitting: Oncology

## 2019-06-29 DIAGNOSIS — C8233 Follicular lymphoma grade IIIa, intra-abdominal lymph nodes: Secondary | ICD-10-CM | POA: Diagnosis present

## 2019-06-29 LAB — GLUCOSE, CAPILLARY: Glucose-Capillary: 90 mg/dL (ref 70–99)

## 2019-06-29 MED ORDER — FLUDEOXYGLUCOSE F - 18 (FDG) INJECTION
9.9000 | Freq: Once | INTRAVENOUS | Status: AC | PRN
  Administered 2019-06-29: 10.51 via INTRAVENOUS

## 2019-07-03 ENCOUNTER — Telehealth: Payer: Self-pay | Admitting: Urology

## 2019-07-03 NOTE — Telephone Encounter (Signed)
I See she had a stent exchange in June. There is no note or appointment for her to have the stent removed. Please advise on removal.

## 2019-07-03 NOTE — Telephone Encounter (Signed)
You are completley booked for weeks can I double book you somewhere?   Andrea Lloyd

## 2019-07-03 NOTE — Telephone Encounter (Signed)
Pt called and states that she needs her stent removed and she was waiting until she had her PET scan done. PET scan was done on 06/29/2019, how soon can she have it removed. Please advise.

## 2019-07-03 NOTE — Telephone Encounter (Signed)
Pt called back and states that she would like the stent removed ASAP as it is causing her problems.

## 2019-07-03 NOTE — Telephone Encounter (Signed)
PET/CT showed significant decrease in her retroperitoneal adenopathy.  Can schedule cystoscopy with stent removal

## 2019-07-03 NOTE — Telephone Encounter (Signed)
App made with [facility] per [name] pt is aware   Andrea Lloyd

## 2019-07-05 ENCOUNTER — Other Ambulatory Visit: Payer: Self-pay | Admitting: Oncology

## 2019-07-05 DIAGNOSIS — C8233 Follicular lymphoma grade IIIa, intra-abdominal lymph nodes: Secondary | ICD-10-CM

## 2019-07-08 ENCOUNTER — Ambulatory Visit (INDEPENDENT_AMBULATORY_CARE_PROVIDER_SITE_OTHER): Payer: BLUE CROSS/BLUE SHIELD | Admitting: Urology

## 2019-07-08 ENCOUNTER — Encounter: Payer: Self-pay | Admitting: Urology

## 2019-07-08 ENCOUNTER — Other Ambulatory Visit: Payer: Self-pay

## 2019-07-08 VITALS — BP 130/80 | HR 90 | Wt 191.0 lb

## 2019-07-08 DIAGNOSIS — N1330 Unspecified hydronephrosis: Secondary | ICD-10-CM | POA: Diagnosis not present

## 2019-07-08 MED ORDER — CIPROFLOXACIN HCL 500 MG PO TABS
500.0000 mg | ORAL_TABLET | Freq: Once | ORAL | Status: AC
  Administered 2019-07-08: 500 mg via ORAL

## 2019-07-08 NOTE — Patient Instructions (Signed)
Cystoscopy Cystoscopy is a procedure that is used to help diagnose and sometimes treat conditions that affect the lower urinary tract. The lower urinary tract includes the bladder and the urethra. The urethra is the tube that drains urine from the bladder. Cystoscopy is done using a thin, tube-shaped instrument with a light and camera at the end (cystoscope). The cystoscope may be hard or flexible, depending on the goal of the procedure. The cystoscope is inserted through the urethra, into the bladder. Cystoscopy may be recommended if you have:  Urinary tract infections that keep coming back.  Blood in the urine (hematuria).  An inability to control when you urinate (urinary incontinence) or an overactive bladder.  Unusual cells found in a urine sample.  A blockage in the urethra, such as a urinary stone.  Painful urination.  An abnormality in the bladder found during an intravenous pyelogram (IVP) or CT scan. Cystoscopy may also be done to remove a sample of tissue to be examined under a microscope (biopsy). Tell a health care provider about:  Any allergies you have.  All medicines you are taking, including vitamins, herbs, eye drops, creams, and over-the-counter medicines.  Any problems you or family members have had with anesthetic medicines.  Any blood disorders you have.  Any surgeries you have had.  Any medical conditions you have.  Whether you are pregnant or may be pregnant. What are the risks? Generally, this is a safe procedure. However, problems may occur, including:  Infection.  Bleeding.  Allergic reactions to medicines.  Damage to other structures or organs. What happens before the procedure?  Ask your health care provider about: ? Changing or stopping your regular medicines. This is especially important if you are taking diabetes medicines or blood thinners. ? Taking medicines such as aspirin and ibuprofen. These medicines can thin your blood. Do not take  these medicines unless your health care provider tells you to take them. ? Taking over-the-counter medicines, vitamins, herbs, and supplements.  Follow instructions from your health care provider about eating or drinking restrictions.  Ask your health care provider what steps will be taken to help prevent infection. These may include: ? Washing skin with a germ-killing soap. ? Taking antibiotic medicine.  You may have an exam or testing, such as: ? X-rays of the bladder, urethra, or kidneys. ? Urine tests to check for signs of infection.  Plan to have someone take you home from the hospital or clinic. What happens during the procedure?   You will be given one or more of the following: ? A medicine to help you relax (sedative). ? A medicine to numb the area (local anesthetic).  The area around the opening of your urethra will be cleaned.  The cystoscope will be passed through your urethra into your bladder.  Germ-free (sterile) fluid will flow through the cystoscope to fill your bladder. The fluid will stretch your bladder so that your health care provider can clearly examine your bladder walls.  Your doctor will look at the urethra and bladder. Your doctor may take a biopsy or remove stones.  The cystoscope will be removed, and your bladder will be emptied. The procedure may vary among health care providers and hospitals. What can I expect after the procedure? After the procedure, it is common to have:  Some soreness or pain in your abdomen and urethra.  Urinary symptoms. These include: ? Mild pain or burning when you urinate. Pain should stop within a few minutes after you urinate. This   may last for up to 1 week. ? A small amount of blood in your urine for several days. ? Feeling like you need to urinate but producing only a small amount of urine. Follow these instructions at home: Medicines  Take over-the-counter and prescription medicines only as told by your health care  provider.  If you were prescribed an antibiotic medicine, take it as told by your health care provider. Do not stop taking the antibiotic even if you start to feel better. General instructions  Return to your normal activities as told by your health care provider. Ask your health care provider what activities are safe for you.  Do not drive for 24 hours if you were given a sedative during your procedure.  Watch for any blood in your urine. If the amount of blood in your urine increases, call your health care provider.  Follow instructions from your health care provider about eating or drinking restrictions.  If a tissue sample was removed for testing (biopsy) during your procedure, it is up to you to get your test results. Ask your health care provider, or the department that is doing the test, when your results will be ready.  Drink enough fluid to keep your urine pale yellow.  Keep all follow-up visits as told by your health care provider. This is important. Contact a health care provider if you:  Have pain that gets worse or does not get better with medicine, especially pain when you urinate.  Have trouble urinating.  Have more blood in your urine. Get help right away if you:  Have blood clots in your urine.  Have abdominal pain.  Have a fever or chills.  Are unable to urinate. Summary  Cystoscopy is a procedure that is used to help diagnose and sometimes treat conditions that affect the lower urinary tract.  Cystoscopy is done using a thin, tube-shaped instrument with a light and camera at the end.  After the procedure, it is common to have some soreness or pain in your abdomen and urethra.  Watch for any blood in your urine. If the amount of blood in your urine increases, call your health care provider.  If you were prescribed an antibiotic medicine, take it as told by your health care provider. Do not stop taking the antibiotic even if you start to feel better. This  information is not intended to replace advice given to you by your health care provider. Make sure you discuss any questions you have with your health care provider. Document Released: 10/12/2000 Document Revised: 10/07/2018 Document Reviewed: 10/07/2018 Elsevier Patient Education  2020 [employer] Inc[employer]  

## 2019-07-08 NOTE — Progress Notes (Signed)
   07/08/19  CC:  Chief Complaint  Patient presents with  . Cysto Stent Removal    HPI:  Andrea Lloyd is a 63 year old female who underwent a left ureteral stent on 03/28/2019 due to lymphadenopathy and hydronephrosis and obstruction from lymphoma.  She typically follows with Dr. Bernardo Heater.  The stent migrated and he replaced it on 04/23/2019.  She has undergone treatment for the lymphoma and underwent a repeat PET CT scan on 06/29/2019 which showed decrease lymphadenopathy.  She is very eager to have the stent removed for a trial without the stent.  I reviewed her CT scan done on 03/28/2019 as well as the recent PET scan.  I agree she has had a significant reduction in the left retroperitoneal adenopathy.  I went over with her today the nature risks benefits and alternatives to cystoscopy with stent removal the operating cystoscopy and left retrograde and consideration of stent removal.  All questions answered and she elects to proceed with stent removal today.  Her last urine culture was negative. No dysuria or gross hematuria today.   There were no vitals taken for this visit. NED. A&Ox3.   No respiratory distress   Abd soft, NT, ND Normal external genitalia with normal patent urethral meatus  Cystoscopy Procedure Note  Patient identification was confirmed, informed consent was obtained, and patient was prepped using Betadine solution.  Lidocaine jelly was administered per urethral meatus.    Procedure: - Flexible cystoscope introduced, without any difficulty.   - a left ureteral stent was grasped and removed through the urethral meatus intact without difficulty.   Post-Procedure: - Patient tolerated the procedure well.  She was given ciprofloxacin prior to removal.  Chaperone and assistant was Journalist, newspaper.  Assessment/ Plan:  Left hydronephrosis-status post stent removal.  We will see her back in 4 to 6 weeks with a renal ultrasound.  Discussed if she has any uncontrolled pain, fevers chills nausea or  vomiting to go to emergency.   No follow-ups on file.  Festus Aloe, MD

## 2019-07-09 LAB — URINALYSIS, COMPLETE
Bilirubin, UA: NEGATIVE
Glucose, UA: NEGATIVE
Ketones, UA: NEGATIVE
Nitrite, UA: NEGATIVE
Specific Gravity, UA: 1.02 (ref 1.005–1.030)
Urobilinogen, Ur: 0.2 mg/dL (ref 0.2–1.0)
pH, UA: 7 (ref 5.0–7.5)

## 2019-07-09 LAB — MICROSCOPIC EXAMINATION: Bacteria, UA: NONE SEEN

## 2019-07-10 NOTE — Progress Notes (Signed)
Patient is coming in for follow up, has questions for her appointment.

## 2019-07-13 ENCOUNTER — Encounter: Payer: Self-pay | Admitting: Oncology

## 2019-07-13 ENCOUNTER — Other Ambulatory Visit: Payer: Self-pay | Admitting: *Deleted

## 2019-07-13 ENCOUNTER — Inpatient Hospital Stay (HOSPITAL_BASED_OUTPATIENT_CLINIC_OR_DEPARTMENT_OTHER): Payer: BLUE CROSS/BLUE SHIELD | Admitting: Oncology

## 2019-07-13 ENCOUNTER — Inpatient Hospital Stay: Payer: BLUE CROSS/BLUE SHIELD | Attending: Oncology

## 2019-07-13 ENCOUNTER — Inpatient Hospital Stay: Payer: BLUE CROSS/BLUE SHIELD

## 2019-07-13 ENCOUNTER — Other Ambulatory Visit: Payer: Self-pay

## 2019-07-13 VITALS — BP 108/75 | HR 76 | Temp 96.9°F | Resp 18

## 2019-07-13 VITALS — BP 113/77 | HR 84 | Temp 98.8°F | Ht 66.0 in | Wt 192.2 lb

## 2019-07-13 DIAGNOSIS — Z5111 Encounter for antineoplastic chemotherapy: Secondary | ICD-10-CM | POA: Insufficient documentation

## 2019-07-13 DIAGNOSIS — C8233 Follicular lymphoma grade IIIa, intra-abdominal lymph nodes: Secondary | ICD-10-CM

## 2019-07-13 DIAGNOSIS — C8230 Follicular lymphoma grade IIIa, unspecified site: Secondary | ICD-10-CM | POA: Diagnosis not present

## 2019-07-13 DIAGNOSIS — Z79899 Other long term (current) drug therapy: Secondary | ICD-10-CM | POA: Diagnosis not present

## 2019-07-13 DIAGNOSIS — Z5181 Encounter for therapeutic drug level monitoring: Secondary | ICD-10-CM

## 2019-07-13 LAB — CBC WITH DIFFERENTIAL/PLATELET
Abs Immature Granulocytes: 0.02 10*3/uL (ref 0.00–0.07)
Basophils Absolute: 0 10*3/uL (ref 0.0–0.1)
Basophils Relative: 0 %
Eosinophils Absolute: 0.3 10*3/uL (ref 0.0–0.5)
Eosinophils Relative: 7 %
HCT: 38.5 % (ref 36.0–46.0)
Hemoglobin: 12.9 g/dL (ref 12.0–15.0)
Immature Granulocytes: 0 %
Lymphocytes Relative: 9 %
Lymphs Abs: 0.4 10*3/uL — ABNORMAL LOW (ref 0.7–4.0)
MCH: 30.4 pg (ref 26.0–34.0)
MCHC: 33.5 g/dL (ref 30.0–36.0)
MCV: 90.8 fL (ref 80.0–100.0)
Monocytes Absolute: 0.6 10*3/uL (ref 0.1–1.0)
Monocytes Relative: 12 %
Neutro Abs: 3.5 10*3/uL (ref 1.7–7.7)
Neutrophils Relative %: 72 %
Platelets: 197 10*3/uL (ref 150–400)
RBC: 4.24 MIL/uL (ref 3.87–5.11)
RDW: 14.1 % (ref 11.5–15.5)
WBC: 4.8 10*3/uL (ref 4.0–10.5)
nRBC: 0 % (ref 0.0–0.2)

## 2019-07-13 LAB — COMPREHENSIVE METABOLIC PANEL
ALT: 30 U/L (ref 0–44)
AST: 29 U/L (ref 15–41)
Albumin: 4.2 g/dL (ref 3.5–5.0)
Alkaline Phosphatase: 76 U/L (ref 38–126)
Anion gap: 5 (ref 5–15)
BUN: 15 mg/dL (ref 8–23)
CO2: 27 mmol/L (ref 22–32)
Calcium: 9 mg/dL (ref 8.9–10.3)
Chloride: 109 mmol/L (ref 98–111)
Creatinine, Ser: 0.6 mg/dL (ref 0.44–1.00)
GFR calc Af Amer: >60 mL/min (ref >60)
GFR calc non Af Amer: >60 mL/min (ref >60)
Glucose, Bld: 97 mg/dL (ref 70–99)
Potassium: 4 mmol/L (ref 3.5–5.1)
Sodium: 141 mmol/L (ref 135–145)
Total Bilirubin: 0.8 mg/dL (ref 0.3–1.2)
Total Protein: 6.7 g/dL (ref 6.5–8.1)

## 2019-07-13 MED ORDER — SODIUM CHLORIDE 0.9 % IV SOLN
Freq: Once | INTRAVENOUS | Status: AC
  Administered 2019-07-13: 10:00:00 via INTRAVENOUS
  Filled 2019-07-13: qty 250

## 2019-07-13 MED ORDER — DIPHENHYDRAMINE HCL 25 MG PO CAPS
50.0000 mg | ORAL_CAPSULE | Freq: Once | ORAL | Status: AC
  Administered 2019-07-13: 50 mg via ORAL
  Filled 2019-07-13: qty 2

## 2019-07-13 MED ORDER — ACETAMINOPHEN 325 MG PO TABS
650.0000 mg | ORAL_TABLET | Freq: Once | ORAL | Status: AC
  Administered 2019-07-13: 10:00:00 650 mg via ORAL
  Filled 2019-07-13: qty 2

## 2019-07-13 MED ORDER — ACYCLOVIR 400 MG PO TABS: 400.0000 mg | ORAL_TABLET | Freq: Every day | ORAL | 3 refills | Status: AC

## 2019-07-13 MED ORDER — HEPARIN SOD (PORK) LOCK FLUSH 100 UNIT/ML IV SOLN
500.0000 [IU] | Freq: Once | INTRAVENOUS | Status: AC
  Administered 2019-07-13: 13:00:00 500 [IU] via INTRAVENOUS
  Filled 2019-07-13: qty 5

## 2019-07-13 MED ORDER — HEPARIN SOD (PORK) LOCK FLUSH 100 UNIT/ML IV SOLN: 500.0000 [IU] | Freq: Once | INTRAVENOUS | Status: DC | PRN

## 2019-07-13 MED ORDER — SODIUM CHLORIDE 0.9 % IV SOLN
375.0000 mg/m2 | Freq: Once | INTRAVENOUS | Status: AC
  Administered 2019-07-13: 11:00:00 700 mg via INTRAVENOUS
  Filled 2019-07-13: qty 50

## 2019-07-13 MED ORDER — SODIUM CHLORIDE 0.9 % IV SOLN
90.0000 mg/m2 | Freq: Once | INTRAVENOUS | Status: AC
  Administered 2019-07-13: 13:00:00 175 mg via INTRAVENOUS
  Filled 2019-07-13: qty 7

## 2019-07-13 MED ORDER — DEXAMETHASONE SODIUM PHOSPHATE 10 MG/ML IJ SOLN
10.0000 mg | Freq: Once | INTRAMUSCULAR | Status: AC
  Administered 2019-07-13: 10 mg via INTRAVENOUS
  Filled 2019-07-13: qty 1

## 2019-07-13 MED ORDER — SODIUM CHLORIDE 0.9% FLUSH
10.0000 mL | INTRAVENOUS | Status: DC | PRN
  Administered 2019-07-13: 08:00:00 10 mL via INTRAVENOUS
  Filled 2019-07-13: qty 10

## 2019-07-13 MED ORDER — PALONOSETRON HCL INJECTION 0.25 MG/5ML
0.2500 mg | Freq: Once | INTRAVENOUS | Status: AC
  Administered 2019-07-13: 13:00:00 0.25 mg via INTRAVENOUS

## 2019-07-13 NOTE — Progress Notes (Signed)
Patient states she needs refill on Acyclovir.

## 2019-07-13 NOTE — Telephone Encounter (Signed)
Ts was already sent this morning

## 2019-07-13 NOTE — Progress Notes (Signed)
Hematology/Oncology Consult note [facility]  [phone] [phone]  Patient Care Team: Patient, [name] as PCP - General (General Practice)   Name of the patient: Andrea Lloyd  [id number]  Apr 15, 1956   Date of visit: 07/13/19  Diagnosis- stage III grade 3A follicular lymphoma   Chief complaint/ Reason for visit-on treatment assessment prior to cycle 4-day 1 of bendamustine and Rituxan  Heme/Onc history:  patient is 63 year old female who was seen by [facility] for evaluation of left lower quadrant abdominal pain which has been ongoing for the last few monthsShe has a history of hysterectomy/sacral colpopexy/ vaginal mesh in 2014.   2. This was followed by a CT of the abdomen which showed: Multiple enlarged abdominal and left common iliac nodes. Findings are worrisome for either metastatic adenopathy from a abdominal primary versus lymphoproliferative disorder. Consider further investigation with PET-CT and tissue sampling.  3. PET/CT scan from 01/08/2017 showed:Hypermetabolic upper abdominal and retroperitoneal lymph nodes, measuring up to 1.9 cm short axis, worrisome for nodal metastases versus lymphoma.  Hypermetabolic 7 mm short axis node at the left thoracic inlet, worrisome for lymphatic spread via the thoracic duct.  4. Tumor markers including CEA and CA-19-9 was normal at 1 and 8 respectively.  5. She is otherwise doing well and denies any complaints of unintentional weight loss, loss of appetite, fevers or chills or drenching night sweats. Her abdominal pain is mostly associated with eating and sometimes comes on a day later. It is mainly in her left lower quadrant and is a dull aching pain but sometimes she seems to have pain in the epigastrium and right upper quadrant  6. Patient underwent IR guided retroperitoneal LN biopsy which showed : DIAGNOSIS:  A. LYMPH NODE, LEFT RETROPERITONEAL;  CT-GUIDED BIOPSY:  - FOLLICULAR LYMPHOMA, GRADE 1-2.  7. Bone marrow biopsy negative for lymphoma. Normal karyotype and cytogenetics  8.Patient underwent excisional lymph node biopsy of the left supraclavicular lymph node.Pathology showed [place] 3A follicular lymphoma. No diffuse pattern was seen.Patient was started on bendamustine Rituxan chemotherapy on 04/17/2019.  Interim scans after 3/8 showed excellent response to treatment    Interval history-she feels well overall.  Appetite is good she denies any abdominal pain.  Her ureteral stent has been taken out.  ECOG PS- 1 Pain scale- 0   Review of systems- Review of Systems  Constitutional: Negative for chills, fever, malaise/fatigue and weight loss.  HENT: Negative for congestion, ear discharge and nosebleeds.   Eyes: Negative for blurred vision.  Respiratory: Negative for cough, hemoptysis, sputum production, shortness of breath and wheezing.   Cardiovascular: Negative for chest pain, palpitations, orthopnea and claudication.  Gastrointestinal: Negative for abdominal pain, blood in stool, constipation, diarrhea, heartburn, melena, nausea and vomiting.  Genitourinary: Negative for dysuria, flank pain, frequency, hematuria and urgency.  Musculoskeletal: Negative for back pain, joint pain and myalgias.  Skin: Negative for rash.  Neurological: Negative for dizziness, tingling, focal weakness, seizures, weakness and headaches.  Endo/Heme/Allergies: Does not bruise/bleed easily.  Psychiatric/Behavioral: Negative for depression and suicidal ideas. The patient does not have insomnia.      Allergies  Allergen Reactions   Oxycodone Hives   Adhesive [Tape] Rash    Bandaids     Past Medical History:  Diagnosis Date   Asthma    Family history of adverse reaction to anesthesia    Father - slow to wake   GERD (gastroesophageal reflux disease)    Gravida 4 para 4  Lymphoma [facility])      Past Surgical History:    Procedure Laterality Date   ABDOMINAL HYSTERECTOMY     06 01 2013   BLADDER SURGERY     COLONOSCOPY WITH PROPOFOL N/A 02/25/2017   Procedure: COLONOSCOPY WITH PROPOFOL;  Surgeon: Lucilla Lame, MD;  Location: [place];  Service: Endoscopy;  Laterality: N/A;   CYSTOSCOPY W/ URETERAL STENT PLACEMENT Left 03/28/2019   Procedure: CYSTOSCOPY WITH RETROGRADE PYELOGRAM/URETERAL STENT PLACEMENT;  Surgeon: Abbie Sons, MD;  Location: [facility];  Service: Urology;  Laterality: Left;   CYSTOSCOPY WITH STENT PLACEMENT Left 04/23/2019   Procedure: CYSTOSCOPY WITH LEFT STENT EXCHANGE;  Surgeon: Abbie Sons, MD;  Location: [facility];  Service: Urology;  Laterality: Left;   IR IMAGING GUIDED PORT INSERTION  04/06/2019   MASS BIOPSY Left 04/10/2019   Procedure: NECK MASS BIOPSY LEFT;  Surgeon: Jules Husbands, MD;  Location: [facility];  Service: General;  Laterality: Left;   POLYPECTOMY  02/25/2017   Procedure: POLYPECTOMY;  Surgeon: Lucilla Lame, MD;  Location: [place];  Service: Endoscopy;;   PORT A CATH INJECTION ([place] HX)     ROTATOR CUFF REPAIR Right    TONSILLECTOMY  1972    Social History   Socioeconomic History   Marital status: Married    Spouse name: Not on file   Number of children: Not on file   Years of education: Not on file   Highest education level: Not on file  Occupational History   Not on file  Social Needs   Financial resource strain: Not on file   Food insecurity    Worry: Not on file    Inability: Not on file   Transportation needs    Medical: Not on file    Non-medical: Not on file  Tobacco Use   Smoking status: Never Smoker   Smokeless tobacco: Never Used  Substance and Sexual Activity   Alcohol use: No   Drug use: No   Sexual activity: Yes  Lifestyle   Physical activity    Days per week: Not on file    Minutes per session: Not on file   Stress: Not on file  Relationships   Social connections    Talks on phone:  Not on file    Gets together: Not on file    Attends religious service: Not on file    Active member of club or organization: Not on file    Attends meetings of clubs or organizations: Not on file    Relationship status: Not on file   Intimate partner violence    Fear of current or ex partner: Not on file    Emotionally abused: Not on file    Physically abused: Not on file    Forced sexual activity: Not on file  Other Topics Concern   Not on file  Social History Narrative   Not on file    Family History  Problem Relation Age of Onset   Alcohol abuse Maternal Grandfather    Healthy Mother    Cancer Father    Healthy Sister    Healthy Brother    Breast cancer Maternal Aunt    Hyperlipidemia Neg Hx    Heart disease Neg Hx    Diabetes Neg Hx      Current Outpatient Medications:    acyclovir (ZOVIRAX) 400 MG tablet, Take 1 tablet (400 mg total) by mouth daily., Disp: 30 tablet, Rfl: 3   albuterol (PROVENTIL HFA) 108 (  90 Base) MCG/ACT inhaler, Inhale 2 puffs into the lungs every 6 (six) hours as needed for wheezing or shortness of breath. , Disp: , Rfl:    allopurinol (ZYLOPRIM) 300 MG tablet, Take 1 tablet (300 mg total) by mouth 2 (two) times daily., Disp: 60 tablet, Rfl: 2   Cholecalciferol (VITAMIN D3 PO), Take 20,000 mcg by mouth daily., Disp: , Rfl:    Menaquinone-7 (VITAMIN K2 PO), Take 200 mcg by mouth daily., Disp: , Rfl:    Multiple Vitamin (MULTI-VITAMIN DAILY PO), Take 1 tablet by mouth daily. , Disp: , Rfl:    oxybutynin (DITROPAN) 5 MG tablet, Take 1 tablet (5 mg total) by mouth every 8 (eight) hours as needed for bladder spasms., Disp: 30 tablet, Rfl: 0   polyethylene glycol (MIRALAX / GLYCOLAX) 17 g packet, Take 17 g by mouth daily., Disp: , Rfl:    Probiotic Product (PROBIOTIC PO), Take 1 capsule by mouth daily., Disp: , Rfl:    prochlorperazine (COMPAZINE) 10 MG tablet, Take 1 tablet (10 mg total) by mouth every 6 (six) hours as needed  (Nausea or vomiting)., Disp: 30 tablet, Rfl: 1   triamcinolone ointment (KENALOG) 0.5 %, Apply 1 application topically 2 (two) times daily., Disp: 30 g, Rfl: 2   cyclobenzaprine (FLEXERIL) 10 MG tablet, Take 1 tablet (10 mg total) by mouth 3 (three) times daily as needed for muscle spasms. (Patient not taking: Reported on 06/01/2019), Disp: 60 tablet, Rfl: 0   HYDROcodone-acetaminophen (NORCO) 10-325 MG tablet, Take 1 tablet by mouth every 6 (six) hours as needed. (Patient not taking: Reported on 06/01/2019), Disp: 90 tablet, Rfl: 0   lidocaine-prilocaine (EMLA) cream, Apply to affected area once (Patient not taking: Reported on 07/08/2019), Disp: 30 g, Rfl: 3   LORazepam (ATIVAN) 0.5 MG tablet, Take 1 tablet (0.5 mg total) by mouth every 6 (six) hours as needed (Nausea or vomiting). (Patient not taking: Reported on 06/01/2019), Disp: 30 tablet, Rfl: 0   ondansetron (ZOFRAN) 8 MG tablet, Take 1 tablet (8 mg total) by mouth 2 (two) times daily as needed for refractory nausea / vomiting. Start on day 2 after bendamustine chemotherapy. (Patient not taking: Reported on 06/01/2019), Disp: 30 tablet, Rfl: 1 No current facility-administered medications for this visit.   Facility-Administered Medications Ordered in Other Visits:    heparin lock flush 100 unit/mL, 500 Units, Intracatheter, Once PRN, Sindy Guadeloupe, MD   sodium chloride flush (NS) 0.9 % injection 10 mL, 10 mL, Intravenous, PRN, Sindy Guadeloupe, MD, 10 mL at 07/13/19 2542  Physical exam:  Vitals:   07/13/19 0857  BP: 113/77  Pulse: 84  Temp: 98.8 F (37.1 C)  Weight: 192 lb 3.2 oz (87.2 kg)  Height: _0  (1.676 m)   Physical Exam Constitutional:      General: She is not in acute distress. HENT:     Head: Normocephalic and atraumatic.  Eyes:     Pupils: Pupils are equal, round, and reactive to light.  Neck:     Musculoskeletal: Normal range of motion.  Cardiovascular:     Rate and Rhythm: Normal rate and regular rhythm.     Heart  sounds: Normal heart sounds.  Pulmonary:     Effort: Pulmonary effort is normal.     Breath sounds: Normal breath sounds.  Abdominal:     General: Bowel sounds are normal.     Palpations: Abdomen is soft.  Skin:    General: Skin is warm and dry.  Neurological:  Mental Status: She is alert and oriented to person, place, and time.      CMP Latest Ref Rng & Units 07/13/2019  Glucose 70 - 99 mg/dL 97  BUN 8 - 23 mg/dL 15  Creatinine 0.44 - 1.00 mg/dL 0.60  Sodium 135 - 145 mmol/L 141  Potassium 3.5 - 5.1 mmol/L 4.0  Chloride 98 - 111 mmol/L 109  CO2 22 - 32 mmol/L 27  Calcium 8.9 - 10.3 mg/dL 9.0  Total Protein 6.5 - 8.1 g/dL 6.7  Total Bilirubin 0.3 - 1.2 mg/dL 0.8  Alkaline Phos 38 - 126 U/L 76  AST 15 - 41 U/L 29  ALT 0 - 44 U/L 30   CBC Latest Ref Rng & Units 07/13/2019  WBC 4.0 - 10.5 K/uL 4.8  Hemoglobin 12.0 - 15.0 g/dL 12.9  Hematocrit 36.0 - 46.0 % 38.5  Platelets 150 - 400 K/uL 197    No images are attached to the encounter.  Nm Pet Image Restag (ps) Skull Base To Thigh  Result Date: 06/29/2019 CLINICAL DATA:  Subsequent treatment strategy for follicular lymphoma, post 3 cycles of chemotherapy. EXAM: NUCLEAR MEDICINE PET SKULL BASE TO THIGH TECHNIQUE: 10.5 mCi F-18 FDG was injected intravenously. Full-ring PET imaging was performed from the skull base to thigh after the radiotracer. CT data was obtained and used for attenuation correction and anatomic localization. Fasting blood glucose: 90 mg/dl COMPARISON:  CT chest 04/23/2019, CT abdomen pelvis 03/11/2019. PET 04/02/2019. FINDINGS: Mediastinal blood pool activity: SUV max 2.5 Liver activity: SUV max 3.5 NECK: No abnormal hypermetabolism. Previously seen left supraclavicular/internal jugular lymph node has been resected. Incidental CT findings: None. CHEST: No hypermetabolic mediastinal, hilar or axillary lymph nodes. Retrocrural lymph nodes have decreased in size and are no longer hypermetabolic. No hypermetabolic  pulmonary nodules. Incidental CT findings: Retrocrural lymph nodes measure up to 9 mm (CT image 138), previously 1.5 cm. Right IJ Port-A-Cath terminates in the right atrium. No pericardial or pleural effusion. ABDOMEN/PELVIS: No abnormal hypermetabolism in the liver, adrenal glands, spleen or pancreas. Marked decrease in size of retroperitoneal adenopathy with hypermetabolism hovering around mediastinal blood pool (SUV max 2.0-2.9). Incidental CT findings: Liver is unremarkable. Gallbladder appears somewhat distended. Adrenal glands and right kidney are unremarkable. Punctate stone in the left kidney. Moderate left hydronephrosis with a double-J ureteral stent in place. The proximal loop is formed at the left ureteropelvic junction. Distal loop is formed in the bladder. Stomach and bowel are grossly unremarkable. Retroperitoneal lymph nodes have decreased in size in the interval. Index left periaortic lymph node measures 2.3 cm (CT image 162), previously at least 4.7 cm. SKELETON: No abnormal osseous hypermetabolism. Hypermetabolism is seen within a soft tissue lesion within the subcutaneous fat overlying the left gluteus musculature, measuring 3.0 x 5.5 cm an SUV 3.7. Lesion is smaller than on the comparison examination and may be posttraumatic or iatrogenic in etiology. Incidental CT findings: None. IMPRESSION: 1. Marked interval response to therapy with decrease in size of retrocrural and abdominal retroperitoneal lymph nodes. Residual metabolism covers around mediastinal blood pool, as detailed above ([place] 2-3). 2. Left supraclavicular lymph node resection. 3. Punctate left renal stone. 4. Moderate left hydronephrosis with double-J ureteral stent in place. Proximal loop is formed in the region of the left ureteropelvic junction. Electronically Signed   By: Lorin Picket M.D.   On: 06/29/2019 13:46     Assessment and plan- Patient is a 63 y.o. female with stage III grade 3A follicular lymphoma here for  on  treatment assessment prior to cycle 4-day 1 of bendamustine and Rituxan chemotherapy  I have reviewed PET/CT scan images independently and discussed findings with the patient.  Overall after 3 cycles of bendamustine and Rituxan chemotherapy patient has had an excellent response to treatment. No hypermetabolic mediastinal hilar or axillary lymph nodes were noted.  Retrocrural lymph nodes have also decreased in size and are no longer hypermetabolic.  She did have retroperitoneal adenopathy which was significantly hypermetabolic at initial diagnosis which is markedly decreased in size and now hovering around the mediastinal blood pool.  My plan is to complete 6 cycles of bendamustine Rituxan at this time followed by repeat scans.  Counts are okay to proceed with cycle 4-day 1 of bendamustine and Rituxan today and she will come back on day 2 to receive bendamustine alone.  She is not receiving growth factor support.  I will see her back in 4 weeks time with CBC with differential, CMP and LDH for cycle 5-day 1 of bendamustine and Rituxan   Visit Diagnosis 1. Encounter for antineoplastic chemotherapy   2. Follicular lymphoma grade IIIa of intra-abdominal lymph nodes ([place])   3. Visit for monitoring Rituxan therapy      Dr. Randa Evens, MD, MPH [facility] [phone] 07/13/2019 3:43 PM

## 2019-07-14 ENCOUNTER — Other Ambulatory Visit: Payer: Self-pay

## 2019-07-14 ENCOUNTER — Inpatient Hospital Stay: Payer: BLUE CROSS/BLUE SHIELD

## 2019-07-14 VITALS — BP 116/72 | HR 89 | Temp 99.0°F | Resp 18

## 2019-07-14 DIAGNOSIS — C8230 Follicular lymphoma grade IIIa, unspecified site: Secondary | ICD-10-CM | POA: Diagnosis not present

## 2019-07-14 DIAGNOSIS — C8233 Follicular lymphoma grade IIIa, intra-abdominal lymph nodes: Secondary | ICD-10-CM

## 2019-07-14 MED ORDER — SODIUM CHLORIDE 0.9 % IV SOLN
90.0000 mg/m2 | Freq: Once | INTRAVENOUS | Status: AC
  Administered 2019-07-14: 175 mg via INTRAVENOUS
  Filled 2019-07-14: qty 7

## 2019-07-14 MED ORDER — DEXAMETHASONE SODIUM PHOSPHATE 10 MG/ML IJ SOLN
10.0000 mg | Freq: Once | INTRAMUSCULAR | Status: AC
  Administered 2019-07-14: 10 mg via INTRAVENOUS
  Filled 2019-07-14: qty 1

## 2019-07-14 MED ORDER — SODIUM CHLORIDE 0.9 % IV SOLN
Freq: Once | INTRAVENOUS | Status: AC
  Administered 2019-07-14: 14:00:00 via INTRAVENOUS
  Filled 2019-07-14: qty 250

## 2019-07-14 MED ORDER — SODIUM CHLORIDE 0.9% FLUSH
10.0000 mL | INTRAVENOUS | Status: DC | PRN
  Administered 2019-07-14: 14:00:00 10 mL via INTRAVENOUS
  Filled 2019-07-14: qty 10

## 2019-07-14 MED ORDER — HEPARIN SOD (PORK) LOCK FLUSH 100 UNIT/ML IV SOLN
500.0000 [IU] | Freq: Once | INTRAVENOUS | Status: AC
  Administered 2019-07-14: 14:00:00 500 [IU] via INTRAVENOUS
  Filled 2019-07-14: qty 5

## 2019-07-16 ENCOUNTER — Telehealth: Payer: Self-pay | Admitting: Urology

## 2019-07-16 NOTE — Telephone Encounter (Signed)
ERROR

## 2019-08-03 ENCOUNTER — Ambulatory Visit
Admission: RE | Admit: 2019-08-03 | Discharge: 2019-08-03 | Disposition: A | Discharge status: Home / Self Care | Payer: BLUE CROSS/BLUE SHIELD | Source: Ambulatory Visit | Attending: Urology | Admitting: Urology

## 2019-08-03 ENCOUNTER — Other Ambulatory Visit: Payer: Self-pay

## 2019-08-03 ENCOUNTER — Ambulatory Visit: Payer: BLUE CROSS/BLUE SHIELD

## 2019-08-03 DIAGNOSIS — N1330 Unspecified hydronephrosis: Secondary | ICD-10-CM | POA: Diagnosis present

## 2019-08-05 ENCOUNTER — Other Ambulatory Visit: Payer: Self-pay

## 2019-08-05 ENCOUNTER — Encounter: Payer: Self-pay | Admitting: Urology

## 2019-08-05 ENCOUNTER — Ambulatory Visit: Payer: BLUE CROSS/BLUE SHIELD | Admitting: Urology

## 2019-08-05 VITALS — BP 145/90 | HR 92 | Ht 66.0 in | Wt 190.0 lb

## 2019-08-05 DIAGNOSIS — N1330 Unspecified hydronephrosis: Secondary | ICD-10-CM | POA: Diagnosis not present

## 2019-08-05 NOTE — Patient Instructions (Signed)
Hydronephrosis   Hydronephrosis is the swelling of one or both kidneys due to a blockage that stops urine from flowing out of the body. Kidneys filter waste from the blood and produce urine. This condition can lead to kidney failure and may become life threatening if not treated promptly. What are the causes? Common causes of this condition include:  Problems that occur when a baby is developing in the womb (congenital defect). These can include problems: ? In the kidneys. ? In the tubes that drain urine from the kidneys into the bladder (ureters).  Kidney stones.  Bladder infection.  An enlarged prostate gland.  Scar tissue from a previous surgery or injury.  A blood clot.  A tumor or cyst in the abdomen or pelvis.  Cancer of the prostate, bladder, uterus, ovary, or colon. What are the signs or symptoms? Symptoms of this condition include:  Pain or discomfort in your side (flank).  Pain and swelling in your abdomen.  Nausea and vomiting.  Fever.  Pain when passing urine.  Feelings of urgency when you need to urinate.  Urinating more often than normal. In some cases, you may not have any symptoms. How is this diagnosed? This condition may be diagnosed based on:  Your symptoms and medical history.  A physical exam.  Blood and urine tests.  Imaging tests, such as an ultrasound, CT scan, or MRI.  A procedure in which a scope is inserted into the urethra and used to view parts of the urinary tract and bladder (cystoscopy). How is this treated? Treatment for this condition depends on where the blockage is, how long it has been there, and what caused it. The goal of treatment is to remove the blockage. Treatment may include:  Antibiotic medicines to treat or prevent infection.  A procedure to place a small, thin tube (stent) into a blocked ureter. The stent will keep the ureter open so that urine can drain through it.  A nonsurgical procedure that crushes kidney  stones with shock waves (extracorporeal shock wave lithotripsy).  If kidney failure occurs, treatment may include dialysis or a kidney transplant. Follow these instructions at home:    Take over-the-counter and prescription medicines only as told by your health care provider.  Rest and return to your normal activities as told by your health care provider. Ask your health care provider what activities are safe for you.  Drink enough fluid to keep your urine pale yellow.  If you were prescribed an antibiotic medicine, take it exactly as told by your health care provider. Do not stop taking the antibiotic even if you start to feel better.  Keep all follow-up visits as told by your health care provider. This is important. Contact a health care provider if:  You continue to have symptoms after treatment.  You develop new symptoms.  Your urine becomes cloudy or bloody.  You have a fever. Get help right away if:  You have severe flank or abdominal pain.  You cannot drink fluids without vomiting. Summary  Hydronephrosis is the swelling of one or both kidneys due to a blockage that stops urine from flowing out of the body.  Hydronephrosis can lead to kidney failure and may become life threatening if not treated promptly.  The goal of treatment is to treat the cause of the blockage. It may include insertion of stent into a blocked ureter, a procedure to treat kidney stones, and antibiotic medicines.  Follow your health care provider's instructions for taking care of  yourself at home, including instructions about drinking fluids, taking medicines, and limiting activities. This information is not intended to replace advice given to you by your health care provider. Make sure you discuss any questions you have with your health care provider. Document Released: 08/12/2007 Document Revised: 10/26/2017 Document Reviewed: 10/26/2017 Elsevier Patient Education  2020 [employer].

## 2019-08-05 NOTE — Progress Notes (Signed)
08/05/2019 10:02 AM   Andrea Lloyd [birth year] [id number]  Referring provider: No referring provider defined for this encounter.  Chief Complaint  Patient presents with  . Hydronephrosis    HPI:  Andrea Lloyd is a 63 year old female who underwent a left ureteral stent on 03/28/2019 due to lymphadenopathy and hydronephrosis and obstruction from lymphoma.  She typically follows with Dr. Bernardo Heater. The stent migrated and he replaced it on 04/23/2019. She has undergone treatment for the lymphoma and underwent a repeat PET CT scan on 06/29/2019 which showed a significant decrease in the RP lymphadenopathy. She was eager to have the stent removed. The stent was removed 07/08/2019 and she has done well. Renal US 08/04/2019 revealed a left extrarenal pelvis and maybe some mild left hydronephrosis, but even less than the PET/CT when she has the stent. I compared back to CT from June 2020 and PET scan from Aug 2020. Ureteral jets were seen in the bladder from the left and right side. She has some chronic left back pain. Not severe. No dysuria. Some hematuria once the stent was removed but none since.   PMH: Past Medical History:  Diagnosis Date  . Asthma   . Family history of adverse reaction to anesthesia    Father - slow to wake  . GERD (gastroesophageal reflux disease)   . Gravida 4 para 4   . Lymphoma [facility])     Surgical History: Past Surgical History:  Procedure Laterality Date  . ABDOMINAL HYSTERECTOMY     06 01 2013  . BLADDER SURGERY    . COLONOSCOPY WITH PROPOFOL N/A 02/25/2017   Procedure: COLONOSCOPY WITH PROPOFOL;  Surgeon: Lucilla Lame, MD;  Location: [place];  Service: Endoscopy;  Laterality: N/A;  . CYSTOSCOPY W/ URETERAL STENT PLACEMENT Left 03/28/2019   Procedure: CYSTOSCOPY WITH RETROGRADE PYELOGRAM/URETERAL STENT PLACEMENT;  Surgeon: Abbie Sons, MD;  Location: [facility];  Service: Urology;  Laterality: Left;  . CYSTOSCOPY WITH STENT PLACEMENT Left 04/23/2019   Procedure: CYSTOSCOPY WITH LEFT STENT EXCHANGE;  Surgeon: Abbie Sons, MD;  Location: [facility];  Service: Urology;  Laterality: Left;  . IR IMAGING GUIDED PORT INSERTION  04/06/2019  . MASS BIOPSY Left 04/10/2019   Procedure: NECK MASS BIOPSY LEFT;  Surgeon: Jules Husbands, MD;  Location: [facility];  Service: General;  Laterality: Left;  . POLYPECTOMY  02/25/2017   Procedure: POLYPECTOMY;  Surgeon: Lucilla Lame, MD;  Location: [place];  Service: Endoscopy;;  . PORT A CATH INJECTION ([place] HX)    . ROTATOR CUFF REPAIR Right   . TONSILLECTOMY  1972    Home Medications:  Allergies as of 08/05/2019      Reactions   Oxycodone Hives   Adhesive [tape] Rash   Bandaids      Medication List       Accurate as of August 05, 2019 10:02 AM. If you have any questions, ask your nurse or doctor.        STOP taking these medications   ondansetron 8 MG tablet Commonly known as: Zofran Stopped by: Festus Aloe, MD   oxybutynin 5 MG tablet Commonly known as: DITROPAN Stopped by: Festus Aloe, MD   Proventil HFA 108 (90 Base) MCG/ACT inhaler Generic drug: albuterol Stopped by: Festus Aloe, MD     TAKE these medications   acyclovir 400 MG tablet Commonly known as: ZOVIRAX Take 1 tablet (400 mg total) by mouth daily.   allopurinol 300 MG tablet Commonly known as: ZYLOPRIM Take 1  tablet (300 mg total) by mouth 2 (two) times daily.   cyclobenzaprine 10 MG tablet Commonly known as: FLEXERIL Take 1 tablet (10 mg total) by mouth 3 (three) times daily as needed for muscle spasms.   HYDROcodone-acetaminophen 10-325 MG tablet Commonly known as: Norco Take 1 tablet by mouth every 6 (six) hours as needed.   lidocaine-prilocaine cream Commonly known as: EMLA Apply to affected area once   LORazepam 0.5 MG tablet Commonly known as: Ativan Take 1 tablet (0.5 mg total) by mouth every 6 (six) hours as needed (Nausea or vomiting).   MULTI-VITAMIN DAILY PO Take 1 tablet  by mouth daily.   polyethylene glycol 17 g packet Commonly known as: MIRALAX / GLYCOLAX Take 17 g by mouth daily.   PROBIOTIC PO Take 1 capsule by mouth daily.   prochlorperazine 10 MG tablet Commonly known as: COMPAZINE Take 1 tablet (10 mg total) by mouth every 6 (six) hours as needed (Nausea or vomiting).   triamcinolone ointment 0.5 % Commonly known as: KENALOG Apply 1 application topically 2 (two) times daily.   VITAMIN D3 PO Take 20,000 mcg by mouth daily.   VITAMIN K2 PO Take 200 mcg by mouth daily.       Allergies:  Allergies  Allergen Reactions  . Oxycodone Hives  . Adhesive [Tape] Rash    Bandaids    Family History: Family History  Problem Relation Age of Onset  . Alcohol abuse Maternal Grandfather   . Healthy Mother   . Cancer Father   . Healthy Sister   . Healthy Brother   . Breast cancer Maternal Aunt   . Hyperlipidemia Neg Hx   . Heart disease Neg Hx   . Diabetes Neg Hx     Social History:  reports that she has never smoked. She has never used smokeless tobacco. She reports that she does not drink alcohol or use drugs.  ROS: UROLOGY Frequent Urination?: No Hard to postpone urination?: No Burning/pain with urination?: No Get up at night to urinate?: No Leakage of urine?: No Urine stream starts and stops?: No Trouble starting stream?: No Do you have to strain to urinate?: No Blood in urine?: No Urinary tract infection?: No Sexually transmitted disease?: No Injury to kidneys or bladder?: No Painful intercourse?: No Weak stream?: No Currently pregnant?: No Vaginal bleeding?: No Last menstrual period?: n  Gastrointestinal Nausea?: No Vomiting?: No Indigestion/heartburn?: No Diarrhea?: No Constipation?: No  Constitutional Fever: No Night sweats?: No Weight loss?: No Fatigue?: No  Skin Skin rash/lesions?: No Itching?: No  Eyes Blurred vision?: No Double vision?: No  Ears/Nose/Throat Sore throat?: No Sinus problems?:  No  Hematologic/Lymphatic Swollen glands?: No Easy bruising?: No  Cardiovascular Leg swelling?: No Chest pain?: No  Respiratory Cough?: No Shortness of breath?: No  Endocrine Excessive thirst?: No  Musculoskeletal Back pain?: No Joint pain?: No  Neurological Headaches?: No Dizziness?: No  Psychologic Depression?: No Anxiety?: No  Physical Exam: BP (!) 145/90   Pulse 92   Ht 5\' 6"  (1.676 m)   Wt 86.2 kg   BMI 30.67 kg/m   Constitutional:  Alert and oriented, No acute distress. HEENT: [place] AT, moist mucus membranes.  Trachea midline, no masses. Cardiovascular: No clubbing, cyanosis, or edema. Respiratory: Normal respiratory effort, no increased work of breathing. GI: Abdomen is soft, nontender, nondistended, no abdominal masses GU: No CVA tenderness Skin: No rashes, bruises or suspicious lesions. Neurologic: Grossly intact, no focal deficits, moving all 4 extremities. Psychiatric: Normal mood and affect.  Laboratory Data: Lab Results  Component Value Date   WBC 4.8 07/13/2019   HGB 12.9 07/13/2019   HCT 38.5 07/13/2019   MCV 90.8 07/13/2019   PLT 197 07/13/2019    Lab Results  Component Value Date   CREATININE 0.60 07/13/2019    No results found for: PSA  No results found for: TESTOSTERONE  No results found for: HGBA1C  Urinalysis    Component Value Date/Time   COLORURINE YELLOW (A) 06/16/2019 0926   APPEARANCEUR Cloudy (A) 07/08/2019 1543   LABSPEC 1.015 06/16/2019 0926   PHURINE 6.0 06/16/2019 0926   GLUCOSEU Negative 07/08/2019 1543   HGBUR LARGE (A) 06/16/2019 0926   BILIRUBINUR Negative 07/08/2019 1543   KETONESUR NEGATIVE 06/16/2019 0926   PROTEINUR 1+ (A) 07/08/2019 1543   PROTEINUR 30 (A) 06/16/2019 0926   NITRITE Negative 07/08/2019 1543   NITRITE NEGATIVE 06/16/2019 0926   LEUKOCYTESUR Trace (A) 07/08/2019 1543   LEUKOCYTESUR SMALL (A) 06/16/2019 0926    Lab Results  Component Value Date   LABMICR See below: 07/08/2019    WBCUA 0-5 07/08/2019   LABEPIT 0-10 07/08/2019   BACTERIA None seen 07/08/2019    Pertinent Imaging: CT x 2  Results for orders placed during the hospital encounter of 03/28/19  DG Abd 1 View   Narrative CLINICAL DATA:  Left ureteral stent placement.  FLUOROSCOPY TIME:  42 seconds.  Images: 2  EXAM: ABDOMEN - 1 VIEW  COMPARISON:  CT scan Mar 28, 2019  FINDINGS: By the end of the study, a stent has been placed in the left ureter. Only the proximal aspect of the stent is identified. The proximal portion of the stent has not formed a complete pigtail. Hydronephrosis remains on the final image of the study.  IMPRESSION: Left ureteral stent placement as above.   Electronically Signed   By: Dorise Bullion III M.D   On: 03/28/2019 17:42    No results found for this or any previous visit. No results found for this or any previous visit. No results found for this or any previous visit. Results for orders placed during the hospital encounter of 08/03/19  US RENAL   Narrative CLINICAL DATA:  Patient with a history of lymphoma resulting in left hydronephrosis due to lymphadenopathy. Status post left ureteral stent removal 07/08/2019.  EXAM: RENAL / URINARY TRACT ULTRASOUND COMPLETE  COMPARISON:  PET CT scan 06/29/2019.  FINDINGS: Right Kidney:  Renal measurements: 11.7 x 4.7 x 5.4 cm = volume: 155 mL . Echogenicity within normal limits. No mass or hydronephrosis visualized.  Left Kidney:  Renal measurements: 11.7 x 4.3 x 4.9 cm = volume: 128 mL. Echogenicity within normal limits. There is some fullness of the right intrarenal collecting system and prominence of the right renal pelvis. No mass visualized.  Bladder:  Appears normal for degree of bladder distention. Bilateral ureteral jets are seen. Prevoid bladder volume was 432 mL and postvoid was 98 mL.  IMPRESSION: Very mild left hydronephrosis and fullness of the left renal pelvis. Bilateral ureteral jets  are identified. The exam is otherwise negative.   Electronically Signed   By: Inge Rise M.D.   On: 08/04/2019 08:33    No results found for this or any previous visit. No results found for this or any previous visit. No results found for this or any previous visit.  Assessment & Plan:    Left hydronephrosis -I discussed the ultrasound findings and some very mild left dilation but it actually looked improved compared  to when she had the stent.  Also jets were noted.  Clinically she is doing well.  We discussed follow-up as needed and she can let us know if she gets any significant flank pain or gross hematuria.  Also her kidney function will be followed presumably with oncology and with her primary care doctors.  We also discussed setting up an ultrasound in about 6 months for routine surveillance.  All questions answered.  She will follow up as needed and let us know if she has any changes.   No follow-ups on file.  Festus Aloe, MD  [facility] [address] [place], [place] [address] [phone]

## 2019-08-07 NOTE — Progress Notes (Signed)
Patient does not offer any problems.

## 2019-08-10 ENCOUNTER — Encounter: Payer: Self-pay | Admitting: Oncology

## 2019-08-10 ENCOUNTER — Inpatient Hospital Stay: Payer: BLUE CROSS/BLUE SHIELD

## 2019-08-10 ENCOUNTER — Inpatient Hospital Stay: Payer: BLUE CROSS/BLUE SHIELD | Attending: Oncology | Admitting: Oncology

## 2019-08-10 ENCOUNTER — Other Ambulatory Visit: Payer: Self-pay

## 2019-08-10 VITALS — BP 116/78 | HR 88 | Temp 98.7°F | Ht 66.0 in | Wt 197.0 lb

## 2019-08-10 DIAGNOSIS — C8230 Follicular lymphoma grade IIIa, unspecified site: Secondary | ICD-10-CM | POA: Diagnosis present

## 2019-08-10 DIAGNOSIS — Z5111 Encounter for antineoplastic chemotherapy: Secondary | ICD-10-CM

## 2019-08-10 DIAGNOSIS — C8233 Follicular lymphoma grade IIIa, intra-abdominal lymph nodes: Secondary | ICD-10-CM | POA: Diagnosis not present

## 2019-08-10 DIAGNOSIS — Z79899 Other long term (current) drug therapy: Secondary | ICD-10-CM | POA: Diagnosis not present

## 2019-08-10 DIAGNOSIS — R1013 Epigastric pain: Secondary | ICD-10-CM | POA: Insufficient documentation

## 2019-08-10 DIAGNOSIS — Z95828 Presence of other vascular implants and grafts: Secondary | ICD-10-CM

## 2019-08-10 DIAGNOSIS — Z5181 Encounter for therapeutic drug level monitoring: Secondary | ICD-10-CM | POA: Diagnosis not present

## 2019-08-10 LAB — CBC WITH DIFFERENTIAL/PLATELET
Abs Immature Granulocytes: 0.02 10*3/uL (ref 0.00–0.07)
Basophils Absolute: 0 10*3/uL (ref 0.0–0.1)
Basophils Relative: 1 %
Eosinophils Absolute: 0.2 10*3/uL (ref 0.0–0.5)
Eosinophils Relative: 5 %
HCT: 38.4 % (ref 36.0–46.0)
Hemoglobin: 12.8 g/dL (ref 12.0–15.0)
Immature Granulocytes: 0 %
Lymphocytes Relative: 11 %
Lymphs Abs: 0.5 10*3/uL — ABNORMAL LOW (ref 0.7–4.0)
MCH: 30.8 pg (ref 26.0–34.0)
MCHC: 33.3 g/dL (ref 30.0–36.0)
MCV: 92.3 fL (ref 80.0–100.0)
Monocytes Absolute: 0.6 10*3/uL (ref 0.1–1.0)
Monocytes Relative: 12 %
Neutro Abs: 3.3 10*3/uL (ref 1.7–7.7)
Neutrophils Relative %: 71 %
Platelets: 202 10*3/uL (ref 150–400)
RBC: 4.16 MIL/uL (ref 3.87–5.11)
RDW: 13 % (ref 11.5–15.5)
WBC: 4.6 10*3/uL (ref 4.0–10.5)
nRBC: 0 % (ref 0.0–0.2)

## 2019-08-10 LAB — COMPREHENSIVE METABOLIC PANEL
ALT: 31 U/L (ref 0–44)
AST: 31 U/L (ref 15–41)
Albumin: 4.2 g/dL (ref 3.5–5.0)
Alkaline Phosphatase: 79 U/L (ref 38–126)
Anion gap: 8 (ref 5–15)
BUN: 12 mg/dL (ref 8–23)
CO2: 24 mmol/L (ref 22–32)
Calcium: 9.1 mg/dL (ref 8.9–10.3)
Chloride: 108 mmol/L (ref 98–111)
Creatinine, Ser: 0.67 mg/dL (ref 0.44–1.00)
GFR calc Af Amer: >60 mL/min (ref >60)
GFR calc non Af Amer: >60 mL/min (ref >60)
Glucose, Bld: 97 mg/dL (ref 70–99)
Potassium: 3.7 mmol/L (ref 3.5–5.1)
Sodium: 140 mmol/L (ref 135–145)
Total Bilirubin: 0.8 mg/dL (ref 0.3–1.2)
Total Protein: 6.8 g/dL (ref 6.5–8.1)

## 2019-08-10 LAB — LACTATE DEHYDROGENASE: LDH: 116 U/L (ref 98–192)

## 2019-08-10 MED ORDER — PALONOSETRON HCL INJECTION 0.25 MG/5ML
0.2500 mg | Freq: Once | INTRAVENOUS | Status: AC
  Administered 2019-08-10: 0.25 mg via INTRAVENOUS

## 2019-08-10 MED ORDER — SODIUM CHLORIDE 0.9 % IV SOLN
Freq: Once | INTRAVENOUS | Status: AC
  Administered 2019-08-10: 09:00:00 via INTRAVENOUS
  Filled 2019-08-10: qty 250

## 2019-08-10 MED ORDER — ACETAMINOPHEN 325 MG PO TABS
650.0000 mg | ORAL_TABLET | Freq: Once | ORAL | Status: AC
  Administered 2019-08-10: 09:00:00 650 mg via ORAL
  Filled 2019-08-10: qty 2

## 2019-08-10 MED ORDER — DEXAMETHASONE SODIUM PHOSPHATE 10 MG/ML IJ SOLN
10.0000 mg | Freq: Once | INTRAMUSCULAR | Status: AC
  Administered 2019-08-10: 09:00:00 10 mg via INTRAVENOUS
  Filled 2019-08-10: qty 1

## 2019-08-10 MED ORDER — SODIUM CHLORIDE 0.9 % IV SOLN
375.0000 mg/m2 | Freq: Once | INTRAVENOUS | Status: AC
  Administered 2019-08-10: 10:00:00 700 mg via INTRAVENOUS
  Filled 2019-08-10: qty 50

## 2019-08-10 MED ORDER — SODIUM CHLORIDE 0.9 % IV SOLN
90.0000 mg/m2 | Freq: Once | INTRAVENOUS | Status: AC
  Administered 2019-08-10: 12:00:00 175 mg via INTRAVENOUS
  Filled 2019-08-10: qty 7

## 2019-08-10 MED ORDER — DIPHENHYDRAMINE HCL 25 MG PO CAPS
50.0000 mg | ORAL_CAPSULE | Freq: Once | ORAL | Status: AC
  Administered 2019-08-10: 50 mg via ORAL
  Filled 2019-08-10: qty 2

## 2019-08-10 MED ORDER — SODIUM CHLORIDE 0.9% FLUSH
10.0000 mL | Freq: Once | INTRAVENOUS | Status: AC
  Administered 2019-08-10: 08:00:00 10 mL via INTRAVENOUS
  Filled 2019-08-10: qty 10

## 2019-08-10 MED ORDER — HEPARIN SOD (PORK) LOCK FLUSH 100 UNIT/ML IV SOLN
500.0000 [IU] | Freq: Once | INTRAVENOUS | Status: AC | PRN
  Administered 2019-08-10: 12:00:00 500 [IU]
  Filled 2019-08-10: qty 5

## 2019-08-10 NOTE — Progress Notes (Signed)
Hematology/Oncology Consult note [facility]  [phone] [phone]  Patient Care Team: Patient, [name] as PCP - General (General Practice)   Name of the patient: Andrea Lloyd  [id number]  July 12, 1956   Date of visit: 08/10/19  Diagnosis-  stage III grade 3A follicular lymphoma  Chief complaint/ Reason for visit-on treatment assessment prior to cycle 5-day 1 of bendamustine and Rituxan  Heme/Onc history: patient is a67 year old female who was seen by [facility] for evaluation of left lower quadrant abdominal pain which has been ongoing for the last few monthsShe has a history of hysterectomy/sacral colpopexy/ vaginal mesh in 2014.   2. This was followed by a CT of the abdomen which showed: Multiple enlarged abdominal and left common iliac nodes. Findings are worrisome for either metastatic adenopathy from a abdominal primary versus lymphoproliferative disorder. Consider further investigation with PET-CT and tissue sampling.  3. PET/CT scan from 01/08/2017 showed:Hypermetabolic upper abdominal and retroperitoneal lymph nodes, measuring up to 1.9 cm short axis, worrisome for nodal metastases versus lymphoma.  Hypermetabolic 7 mm short axis node at the left thoracic inlet, worrisome for lymphatic spread via the thoracic duct.  4. Tumor markers including CEA and CA-19-9 was normal at 1 and 8 respectively.  5. She is otherwise doing well and denies any complaints of unintentional weight loss, loss of appetite, fevers or chills or drenching night sweats. Her abdominal pain is mostly associated with eating and sometimes comes on a day later. It is mainly in her left lower quadrant and is a dull aching pain but sometimes she seems to have pain in the epigastrium and right upper quadrant  6. Patient underwent IR guided retroperitoneal LN biopsy which showed : DIAGNOSIS:  A. LYMPH NODE, LEFT RETROPERITONEAL; CT-GUIDED  BIOPSY:  - FOLLICULAR LYMPHOMA, GRADE 1-2.  7. Bone marrow biopsy negative for lymphoma. Normal karyotype and cytogenetics  8.Patient underwent excisional lymph node biopsy of the left supraclavicular lymph node.Pathology showed Grdae 3A follicular lymphoma. No diffuse pattern was seen.Patient was started on bendamustine Rituxan chemotherapy on 04/17/2019.  Interim scans after 3/8 showed excellent response to treatment   Interval history- she feels well. Reports that her scalp feels dry. Denies other complaints  ECOG PS- 1 Pain scale- 0   Review of systems- Review of Systems  Constitutional: Positive for malaise/fatigue. Negative for chills, fever and weight loss.  HENT: Negative for congestion, ear discharge and nosebleeds.   Eyes: Negative for blurred vision.  Respiratory: Negative for cough, hemoptysis, sputum production, shortness of breath and wheezing.   Cardiovascular: Negative for chest pain, palpitations, orthopnea and claudication.  Gastrointestinal: Negative for abdominal pain, blood in stool, constipation, diarrhea, heartburn, melena, nausea and vomiting.  Genitourinary: Negative for dysuria, flank pain, frequency, hematuria and urgency.  Musculoskeletal: Negative for back pain, joint pain and myalgias.  Skin: Negative for rash.  Neurological: Negative for dizziness, tingling, focal weakness, seizures, weakness and headaches.  Endo/Heme/Allergies: Does not bruise/bleed easily.  Psychiatric/Behavioral: Negative for depression and suicidal ideas. The patient does not have insomnia.       Allergies  Allergen Reactions  . Oxycodone Hives  . Adhesive [Tape] Rash    Bandaids     Past Medical History:  Diagnosis Date  . Asthma   . Family history of adverse reaction to anesthesia    Father - slow to wake  . GERD (gastroesophageal reflux disease)   . Gravida 4 para 4   . Lymphoma ([place])  Past Surgical History:  Procedure Laterality Date  . ABDOMINAL  HYSTERECTOMY     06 01 2013  . BLADDER SURGERY    . COLONOSCOPY WITH PROPOFOL N/A 02/25/2017   Procedure: COLONOSCOPY WITH PROPOFOL;  Surgeon: Lucilla Lame, MD;  Location: [place];  Service: Endoscopy;  Laterality: N/A;  . CYSTOSCOPY W/ URETERAL STENT PLACEMENT Left 03/28/2019   Procedure: CYSTOSCOPY WITH RETROGRADE PYELOGRAM/URETERAL STENT PLACEMENT;  Surgeon: Abbie Sons, MD;  Location: [facility];  Service: Urology;  Laterality: Left;  . CYSTOSCOPY WITH STENT PLACEMENT Left 04/23/2019   Procedure: CYSTOSCOPY WITH LEFT STENT EXCHANGE;  Surgeon: Abbie Sons, MD;  Location: [facility];  Service: Urology;  Laterality: Left;  . IR IMAGING GUIDED PORT INSERTION  04/06/2019  . MASS BIOPSY Left 04/10/2019   Procedure: NECK MASS BIOPSY LEFT;  Surgeon: Jules Husbands, MD;  Location: [facility];  Service: General;  Laterality: Left;  . POLYPECTOMY  02/25/2017   Procedure: POLYPECTOMY;  Surgeon: Lucilla Lame, MD;  Location: [place];  Service: Endoscopy;;  . PORT A CATH INJECTION ([place] HX)    . ROTATOR CUFF REPAIR Right   . TONSILLECTOMY  1972    Social History   Socioeconomic History  . Marital status: Married    Spouse name: Not on file  . Number of children: Not on file  . Years of education: Not on file  . Highest education level: Not on file  Occupational History  . Not on file  Social Needs  . Financial resource strain: Not on file  . Food insecurity    Worry: Not on file    Inability: Not on file  . Transportation needs    Medical: Not on file    Non-medical: Not on file  Tobacco Use  . Smoking status: Never Smoker  . Smokeless tobacco: Never Used  Substance and Sexual Activity  . Alcohol use: No  . Drug use: No  . Sexual activity: Yes  Lifestyle  . Physical activity    Days per week: Not on file    Minutes per session: Not on file  . Stress: Not on file  Relationships  . Social Herbalist on phone: Not on file    Gets together: Not on  file    Attends religious service: Not on file    Active member of club or organization: Not on file    Attends meetings of clubs or organizations: Not on file    Relationship status: Not on file  . Intimate partner violence    Fear of current or ex partner: Not on file    Emotionally abused: Not on file    Physically abused: Not on file    Forced sexual activity: Not on file  Other Topics Concern  . Not on file  Social History Narrative  . Not on file    Family History  Problem Relation Age of Onset  . Alcohol abuse Maternal Grandfather   . Healthy Mother   . Cancer Father   . Healthy Sister   . Healthy Brother   . Breast cancer Maternal Aunt   . Hyperlipidemia Neg Hx   . Heart disease Neg Hx   . Diabetes Neg Hx      Current Outpatient Medications:  .  acyclovir (ZOVIRAX) 400 MG tablet, Take 1 tablet (400 mg total) by mouth daily., Disp: 30 tablet, Rfl: 3 .  Cholecalciferol (VITAMIN D3 PO), Take 20,000 mcg by mouth daily., Disp: ,  Rfl:  .  HYDROcodone-acetaminophen (NORCO) 10-325 MG tablet, Take 1 tablet by mouth every 6 (six) hours as needed., Disp: 90 tablet, Rfl: 0 .  lidocaine-prilocaine (EMLA) cream, Apply to affected area once, Disp: 30 g, Rfl: 3 .  Menaquinone-7 (VITAMIN K2 PO), Take 200 mcg by mouth daily., Disp: , Rfl:  .  Multiple Vitamin (MULTI-VITAMIN DAILY PO), Take 1 tablet by mouth daily. , Disp: , Rfl:  .  prochlorperazine (COMPAZINE) 10 MG tablet, Take 1 tablet (10 mg total) by mouth every 6 (six) hours as needed (Nausea or vomiting)., Disp: 30 tablet, Rfl: 1 .  allopurinol (ZYLOPRIM) 300 MG tablet, Take 1 tablet (300 mg total) by mouth 2 (two) times daily. (Patient not taking: Reported on 08/07/2019), Disp: 60 tablet, Rfl: 2 .  cyclobenzaprine (FLEXERIL) 10 MG tablet, Take 1 tablet (10 mg total) by mouth 3 (three) times daily as needed for muscle spasms. (Patient not taking: Reported on 08/07/2019), Disp: 60 tablet, Rfl: 0 .  LORazepam (ATIVAN) 0.5 MG tablet,  Take 1 tablet (0.5 mg total) by mouth every 6 (six) hours as needed (Nausea or vomiting). (Patient not taking: Reported on 08/07/2019), Disp: 30 tablet, Rfl: 0 .  polyethylene glycol (MIRALAX / GLYCOLAX) 17 g packet, Take 17 g by mouth daily., Disp: , Rfl:  .  Probiotic Product (PROBIOTIC PO), Take 1 capsule by mouth daily., Disp: , Rfl:  .  triamcinolone ointment (KENALOG) 0.5 %, Apply 1 application topically 2 (two) times daily., Disp: 30 g, Rfl: 2  Physical exam:  Vitals:   08/10/19 0841  BP: 116/78  Pulse: 88  Temp: 98.7 F (37.1 C)  TempSrc: Tympanic  Weight: 197 lb (89.4 kg)  Height: '5\' 6"'  (1.676 m)   Physical Exam HENT:     Head: Normocephalic and atraumatic.  Eyes:     Pupils: Pupils are equal, round, and reactive to light.  Neck:     Musculoskeletal: Normal range of motion.  Cardiovascular:     Rate and Rhythm: Normal rate and regular rhythm.     Heart sounds: Normal heart sounds.  Pulmonary:     Effort: Pulmonary effort is normal.     Breath sounds: Normal breath sounds.  Abdominal:     General: Bowel sounds are normal.     Palpations: Abdomen is soft.  Skin:    General: Skin is warm and dry.  Neurological:     Mental Status: She is alert and oriented to person, place, and time.      CMP Latest Ref Rng & Units 07/13/2019  Glucose 70 - 99 mg/dL 97  BUN 8 - 23 mg/dL 15  Creatinine 0.44 - 1.00 mg/dL 0.60  Sodium 135 - 145 mmol/L 141  Potassium 3.5 - 5.1 mmol/L 4.0  Chloride 98 - 111 mmol/L 109  CO2 22 - 32 mmol/L 27  Calcium 8.9 - 10.3 mg/dL 9.0  Total Protein 6.5 - 8.1 g/dL 6.7  Total Bilirubin 0.3 - 1.2 mg/dL 0.8  Alkaline Phos 38 - 126 U/L 76  AST 15 - 41 U/L 29  ALT 0 - 44 U/L 30   CBC Latest Ref Rng & Units 07/13/2019  WBC 4.0 - 10.5 K/uL 4.8  Hemoglobin 12.0 - 15.0 g/dL 12.9  Hematocrit 36.0 - 46.0 % 38.5  Platelets 150 - 400 K/uL 197    No images are attached to the encounter.  US Renal  Result Date: 08/04/2019 CLINICAL DATA:  Patient with a  history of lymphoma resulting in left hydronephrosis  due to lymphadenopathy. Status post left ureteral stent removal 07/08/2019. EXAM: RENAL / URINARY TRACT ULTRASOUND COMPLETE COMPARISON:  PET CT scan 06/29/2019. FINDINGS: Right Kidney: Renal measurements: 11.7 x 4.7 x 5.4 cm = volume: 155 mL . Echogenicity within normal limits. No mass or hydronephrosis visualized. Left Kidney: Renal measurements: 11.7 x 4.3 x 4.9 cm = volume: 128 mL. Echogenicity within normal limits. There is some fullness of the right intrarenal collecting system and prominence of the right renal pelvis. No mass visualized. Bladder: Appears normal for degree of bladder distention. Bilateral ureteral jets are seen. Prevoid bladder volume was 432 mL and postvoid was 98 mL. IMPRESSION: Very mild left hydronephrosis and fullness of the left renal pelvis. Bilateral ureteral jets are identified. The exam is otherwise negative. Electronically Signed   By: Inge Rise M.D.   On: 08/04/2019 08:33     Assessment and plan- Patient is a 63 y.o. female with stage III grade 3A follicular lymphoma.  She is here for on treatment assessment prior to cycle 5-day 1 of bendamustine Rituxan chemotherapy  Counts okay to proceed with cycle 5-day 1 of bendamustine Rituxan chemotherapy today and bendamustine tomorrow.  She has not been receiving growth factor support and her counts have been holding up well.  I will plan to see her in 4 weeks time with CBC with differential, CMP and LDH for cycle 6-day 1.  I did discuss the role of maintenance Rituxan after induction chemotherapy is over.  Rituxan maintenance was given every 2 months for 2 years but in the prima study-the main induction chemotherapy regimens were R-CHOP and RCVP.  It has not been studied after bendamustine and Rituxan.  Maintenance Rituxan showed improvement in progression free survival at 36 months at 75% versus 5 8% but no improvement in overall survival.  Rates of infection were higher  in patients who received Rituxan versus also did not receive Rituxan.  Patient also has ongoing cast issues and may not wish to receive maintenance Rituxan for that reason as well.  I will plan to get repeat PET CT scan after 6 cycles.  Patient will continue acyclovir for up to a month after she completes bendamustine.   Visit Diagnosis 1. Follicular lymphoma grade IIIa of intra-abdominal lymph nodes ([place])   2. Encounter for antineoplastic chemotherapy   3. Visit for monitoring Rituxan therapy      Dr. Randa Evens, MD, MPH [facility] [phone] 08/10/2019 9:55 AM

## 2019-08-10 NOTE — Progress Notes (Signed)
Patient stated that she would like a refill on her inhaler. Patient would like to say that she noticed a rash on her forehead and cheeks after she applied her EMLA cream.

## 2019-08-11 ENCOUNTER — Inpatient Hospital Stay: Payer: BLUE CROSS/BLUE SHIELD

## 2019-08-11 ENCOUNTER — Other Ambulatory Visit: Payer: Self-pay

## 2019-08-11 VITALS — BP 111/77 | HR 97 | Temp 98.0°F | Resp 19

## 2019-08-11 DIAGNOSIS — C8233 Follicular lymphoma grade IIIa, intra-abdominal lymph nodes: Secondary | ICD-10-CM

## 2019-08-11 DIAGNOSIS — C8230 Follicular lymphoma grade IIIa, unspecified site: Secondary | ICD-10-CM | POA: Diagnosis not present

## 2019-08-11 MED ORDER — SODIUM CHLORIDE 0.9 % IV SOLN
Freq: Once | INTRAVENOUS | Status: AC
  Administered 2019-08-11: 14:00:00 via INTRAVENOUS
  Filled 2019-08-11: qty 250

## 2019-08-11 MED ORDER — HEPARIN SOD (PORK) LOCK FLUSH 100 UNIT/ML IV SOLN
500.0000 [IU] | Freq: Once | INTRAVENOUS | Status: AC | PRN
  Administered 2019-08-11: 500 [IU]
  Filled 2019-08-11: qty 5

## 2019-08-11 MED ORDER — SODIUM CHLORIDE 0.9 % IV SOLN
90.0000 mg/m2 | Freq: Once | INTRAVENOUS | Status: AC
  Administered 2019-08-11: 14:00:00 175 mg via INTRAVENOUS
  Filled 2019-08-11: qty 7

## 2019-08-11 MED ORDER — DEXAMETHASONE SODIUM PHOSPHATE 10 MG/ML IJ SOLN
10.0000 mg | Freq: Once | INTRAMUSCULAR | Status: AC
  Administered 2019-08-11: 10 mg via INTRAVENOUS
  Filled 2019-08-11: qty 1

## 2019-08-31 ENCOUNTER — Other Ambulatory Visit: Payer: Self-pay | Admitting: Oncology

## 2019-09-04 ENCOUNTER — Other Ambulatory Visit: Payer: Self-pay

## 2019-09-04 ENCOUNTER — Encounter: Payer: Self-pay | Admitting: Oncology

## 2019-09-04 NOTE — Progress Notes (Signed)
Patient stated that she had been doing well. Patient wants to know what she is needing to know after this chemo therapy.

## 2019-09-07 ENCOUNTER — Inpatient Hospital Stay: Payer: BLUE CROSS/BLUE SHIELD | Attending: Oncology

## 2019-09-07 ENCOUNTER — Other Ambulatory Visit: Payer: Self-pay

## 2019-09-07 ENCOUNTER — Inpatient Hospital Stay: Payer: BLUE CROSS/BLUE SHIELD

## 2019-09-07 ENCOUNTER — Inpatient Hospital Stay (HOSPITAL_BASED_OUTPATIENT_CLINIC_OR_DEPARTMENT_OTHER): Payer: BLUE CROSS/BLUE SHIELD | Admitting: Oncology

## 2019-09-07 VITALS — BP 102/77 | HR 87 | Temp 98.3°F | Resp 16 | Ht 66.0 in | Wt 194.7 lb

## 2019-09-07 DIAGNOSIS — Z5111 Encounter for antineoplastic chemotherapy: Secondary | ICD-10-CM | POA: Insufficient documentation

## 2019-09-07 DIAGNOSIS — K219 Gastro-esophageal reflux disease without esophagitis: Secondary | ICD-10-CM | POA: Insufficient documentation

## 2019-09-07 DIAGNOSIS — Z5112 Encounter for antineoplastic immunotherapy: Secondary | ICD-10-CM | POA: Diagnosis present

## 2019-09-07 DIAGNOSIS — Z79899 Other long term (current) drug therapy: Secondary | ICD-10-CM | POA: Insufficient documentation

## 2019-09-07 DIAGNOSIS — Z803 Family history of malignant neoplasm of breast: Secondary | ICD-10-CM | POA: Insufficient documentation

## 2019-09-07 DIAGNOSIS — Z95828 Presence of other vascular implants and grafts: Secondary | ICD-10-CM

## 2019-09-07 DIAGNOSIS — Z5181 Encounter for therapeutic drug level monitoring: Secondary | ICD-10-CM | POA: Diagnosis not present

## 2019-09-07 DIAGNOSIS — C8233 Follicular lymphoma grade IIIa, intra-abdominal lymph nodes: Secondary | ICD-10-CM | POA: Diagnosis not present

## 2019-09-07 DIAGNOSIS — J45909 Unspecified asthma, uncomplicated: Secondary | ICD-10-CM | POA: Diagnosis not present

## 2019-09-07 DIAGNOSIS — R1032 Left lower quadrant pain: Secondary | ICD-10-CM | POA: Diagnosis not present

## 2019-09-07 DIAGNOSIS — Z90710 Acquired absence of both cervix and uterus: Secondary | ICD-10-CM | POA: Insufficient documentation

## 2019-09-07 DIAGNOSIS — C8230 Follicular lymphoma grade IIIa, unspecified site: Secondary | ICD-10-CM | POA: Diagnosis present

## 2019-09-07 LAB — COMPREHENSIVE METABOLIC PANEL
ALT: 29 U/L (ref 0–44)
AST: 25 U/L (ref 15–41)
Albumin: 4.3 g/dL (ref 3.5–5.0)
Alkaline Phosphatase: 76 U/L (ref 38–126)
Anion gap: 6 (ref 5–15)
BUN: 19 mg/dL (ref 8–23)
CO2: 27 mmol/L (ref 22–32)
Calcium: 8.9 mg/dL (ref 8.9–10.3)
Chloride: 105 mmol/L (ref 98–111)
Creatinine, Ser: 0.73 mg/dL (ref 0.44–1.00)
GFR calc Af Amer: >60 mL/min (ref >60)
GFR calc non Af Amer: >60 mL/min (ref >60)
Glucose, Bld: 101 mg/dL — ABNORMAL HIGH (ref 70–99)
Potassium: 3.8 mmol/L (ref 3.5–5.1)
Sodium: 138 mmol/L (ref 135–145)
Total Bilirubin: 1.2 mg/dL (ref 0.3–1.2)
Total Protein: 6.9 g/dL (ref 6.5–8.1)

## 2019-09-07 LAB — CBC WITH DIFFERENTIAL/PLATELET
Abs Immature Granulocytes: 0.03 10*3/uL (ref 0.00–0.07)
Basophils Absolute: 0 10*3/uL (ref 0.0–0.1)
Basophils Relative: 1 %
Eosinophils Absolute: 0.2 10*3/uL (ref 0.0–0.5)
Eosinophils Relative: 4 %
HCT: 38 % (ref 36.0–46.0)
Hemoglobin: 12.8 g/dL (ref 12.0–15.0)
Immature Granulocytes: 1 %
Lymphocytes Relative: 11 %
Lymphs Abs: 0.5 10*3/uL — ABNORMAL LOW (ref 0.7–4.0)
MCH: 30.8 pg (ref 26.0–34.0)
MCHC: 33.7 g/dL (ref 30.0–36.0)
MCV: 91.3 fL (ref 80.0–100.0)
Monocytes Absolute: 0.6 10*3/uL (ref 0.1–1.0)
Monocytes Relative: 13 %
Neutro Abs: 3.4 10*3/uL (ref 1.7–7.7)
Neutrophils Relative %: 70 %
Platelets: 180 10*3/uL (ref 150–400)
RBC: 4.16 MIL/uL (ref 3.87–5.11)
RDW: 12.9 % (ref 11.5–15.5)
WBC: 4.8 10*3/uL (ref 4.0–10.5)
nRBC: 0 % (ref 0.0–0.2)

## 2019-09-07 MED ORDER — DIPHENHYDRAMINE HCL 25 MG PO CAPS
50.0000 mg | ORAL_CAPSULE | Freq: Once | ORAL | Status: AC
  Administered 2019-09-07: 50 mg via ORAL
  Filled 2019-09-07: qty 2

## 2019-09-07 MED ORDER — PALONOSETRON HCL INJECTION 0.25 MG/5ML
0.2500 mg | Freq: Once | INTRAVENOUS | Status: AC
  Administered 2019-09-07: 0.25 mg via INTRAVENOUS

## 2019-09-07 MED ORDER — HEPARIN SOD (PORK) LOCK FLUSH 100 UNIT/ML IV SOLN
500.0000 [IU] | Freq: Once | INTRAVENOUS | Status: AC | PRN
  Administered 2019-09-07: 500 [IU]
  Filled 2019-09-07: qty 5

## 2019-09-07 MED ORDER — DEXAMETHASONE SODIUM PHOSPHATE 10 MG/ML IJ SOLN
10.0000 mg | Freq: Once | INTRAMUSCULAR | Status: AC
  Administered 2019-09-07: 10 mg via INTRAVENOUS
  Filled 2019-09-07: qty 1

## 2019-09-07 MED ORDER — ACETAMINOPHEN 325 MG PO TABS
650.0000 mg | ORAL_TABLET | Freq: Once | ORAL | Status: AC
  Administered 2019-09-07: 650 mg via ORAL
  Filled 2019-09-07: qty 2

## 2019-09-07 MED ORDER — SODIUM CHLORIDE 0.9 % IV SOLN
90.0000 mg/m2 | Freq: Once | INTRAVENOUS | Status: AC
  Administered 2019-09-07: 175 mg via INTRAVENOUS
  Filled 2019-09-07: qty 7

## 2019-09-07 MED ORDER — SODIUM CHLORIDE 0.9% FLUSH
10.0000 mL | Freq: Once | INTRAVENOUS | Status: AC
  Administered 2019-09-07: 09:00:00 10 mL via INTRAVENOUS
  Filled 2019-09-07: qty 10

## 2019-09-07 MED ORDER — SODIUM CHLORIDE 0.9 % IV SOLN
Freq: Once | INTRAVENOUS | Status: AC
  Administered 2019-09-07: 10:00:00 via INTRAVENOUS
  Filled 2019-09-07: qty 250

## 2019-09-07 MED ORDER — SODIUM CHLORIDE 0.9 % IV SOLN
375.0000 mg/m2 | Freq: Once | INTRAVENOUS | Status: AC
  Administered 2019-09-07: 700 mg via INTRAVENOUS
  Filled 2019-09-07: qty 50

## 2019-09-07 NOTE — Progress Notes (Signed)
Pt states sometimes she has weird things that she feels and she is not sure how to contact us to tell us. I told her that she can send my chart message or test me and I can send it to Dr. Janese Banks. Pt states today that she has dull HA, and at times she feels like she has hot flash and it comes to back of head to her ears and ears get hot and the inside of ear hot and some pain and it lasts 5 min and then goes away. She does not drink any caffeine since she started treatment.

## 2019-09-08 ENCOUNTER — Inpatient Hospital Stay: Payer: BLUE CROSS/BLUE SHIELD

## 2019-09-08 ENCOUNTER — Other Ambulatory Visit: Payer: Self-pay

## 2019-09-08 DIAGNOSIS — C8233 Follicular lymphoma grade IIIa, intra-abdominal lymph nodes: Secondary | ICD-10-CM

## 2019-09-08 DIAGNOSIS — C8230 Follicular lymphoma grade IIIa, unspecified site: Secondary | ICD-10-CM | POA: Diagnosis not present

## 2019-09-08 MED ORDER — HEPARIN SOD (PORK) LOCK FLUSH 100 UNIT/ML IV SOLN
500.0000 [IU] | Freq: Once | INTRAVENOUS | Status: AC | PRN
  Administered 2019-09-08: 500 [IU]
  Filled 2019-09-08: qty 5

## 2019-09-08 MED ORDER — SODIUM CHLORIDE 0.9 % IV SOLN
Freq: Once | INTRAVENOUS | Status: AC
  Administered 2019-09-08: 14:00:00 via INTRAVENOUS
  Filled 2019-09-08: qty 250

## 2019-09-08 MED ORDER — DEXAMETHASONE SODIUM PHOSPHATE 10 MG/ML IJ SOLN
10.0000 mg | Freq: Once | INTRAMUSCULAR | Status: AC
  Administered 2019-09-08: 10 mg via INTRAVENOUS

## 2019-09-08 MED ORDER — DEXAMETHASONE SODIUM PHOSPHATE 10 MG/ML IJ SOLN
INTRAMUSCULAR | Status: AC
  Filled 2019-09-08: qty 1

## 2019-09-08 MED ORDER — SODIUM CHLORIDE 0.9 % IV SOLN
90.0000 mg/m2 | Freq: Once | INTRAVENOUS | Status: AC
  Administered 2019-09-08: 175 mg via INTRAVENOUS
  Filled 2019-09-08: qty 7

## 2019-09-08 NOTE — Progress Notes (Signed)
Hematology/Oncology Consult note [facility]  [phone] [phone]  Patient Care Team: Patient, [name] as PCP - General (General Practice)   Name of the patient: Andrea Lloyd  [id number]  02/18/1956   Date of visit: 09/08/19  Diagnosis- stage III grade 3A follicular lymphoma  Chief complaint/ Reason for visit-on treatment assessment prior to cycle 6-day 1 of bendamustine and Rituxan  Heme/Onc history: patient is a63 year old female who was seen by [facility] for evaluation of left lower quadrant abdominal pain which has been ongoing for the last few monthsShe has a history of hysterectomy/sacral colpopexy/ vaginal mesh in 2014.   2. This was followed by a CT of the abdomen which showed: Multiple enlarged abdominal and left common iliac nodes. Findings are worrisome for either metastatic adenopathy from a abdominal primary versus lymphoproliferative disorder. Consider further investigation with PET-CT and tissue sampling.  3. PET/CT scan from 01/08/2017 showed:Hypermetabolic upper abdominal and retroperitoneal lymph nodes, measuring up to 1.9 cm short axis, worrisome for nodal metastases versus lymphoma.  Hypermetabolic 7 mm short axis node at the left thoracic inlet, worrisome for lymphatic spread via the thoracic duct.  4. Tumor markers including CEA and CA-19-9 was normal at 1 and 8 respectively.  5. She is otherwise doing well and denies any complaints of unintentional weight loss, loss of appetite, fevers or chills or drenching night sweats. Her abdominal pain is mostly associated with eating and sometimes comes on a day later. It is mainly in her left lower quadrant and is a dull aching pain but sometimes she seems to have pain in the epigastrium and right upper quadrant  6. Patient underwent IR guided retroperitoneal LN biopsy which showed : DIAGNOSIS:  A. LYMPH NODE, LEFT RETROPERITONEAL; CT-GUIDED  BIOPSY:  - FOLLICULAR LYMPHOMA, GRADE 1-2.  7. Bone marrow biopsy negative for lymphoma. Normal karyotype and cytogenetics  8.Patient underwent excisional lymph node biopsy of the left supraclavicular lymph node.Pathology showed Grdae 3A follicular lymphoma. No diffuse pattern was seen.Patient was started on bendamustine Rituxan chemotherapy on 04/17/2019. Interim scans after 3/8 showed excellent response to treatment   Interval history-tolerating chemotherapy well.  Reports burning sensation and occasional headaches involving her scalp.  This has been ongoing for the last 6 weeks..  No fever.  ECOG PS- 1 Pain scale- 0   Review of systems- Review of Systems  Constitutional: Positive for malaise/fatigue. Negative for chills, fever and weight loss.  HENT: Negative for congestion, ear discharge and nosebleeds.   Eyes: Negative for blurred vision.  Respiratory: Negative for cough, hemoptysis, sputum production, shortness of breath and wheezing.   Cardiovascular: Negative for chest pain, palpitations, orthopnea and claudication.  Gastrointestinal: Negative for abdominal pain, blood in stool, constipation, diarrhea, heartburn, melena, nausea and vomiting.  Genitourinary: Negative for dysuria, flank pain, frequency, hematuria and urgency.  Musculoskeletal: Negative for back pain, joint pain and myalgias.  Skin: Negative for rash.  Neurological: Positive for headaches. Negative for dizziness, tingling, focal weakness, seizures and weakness.  Endo/Heme/Allergies: Does not bruise/bleed easily.  Psychiatric/Behavioral: Negative for depression and suicidal ideas. The patient does not have insomnia.       Allergies  Allergen Reactions  . Oxycodone Hives  . Adhesive [Tape] Rash    Bandaids     Past Medical History:  Diagnosis Date  . Asthma   . Family history of adverse reaction to anesthesia    Father - slow to wake  . GERD (gastroesophageal reflux disease)   .  Gravida 4 para  4   . Lymphoma [facility])      Past Surgical History:  Procedure Laterality Date  . ABDOMINAL HYSTERECTOMY     06 01 2013  . BLADDER SURGERY    . COLONOSCOPY WITH PROPOFOL N/A 02/25/2017   Procedure: COLONOSCOPY WITH PROPOFOL;  Surgeon: Lucilla Lame, MD;  Location: [place];  Service: Endoscopy;  Laterality: N/A;  . CYSTOSCOPY W/ URETERAL STENT PLACEMENT Left 03/28/2019   Procedure: CYSTOSCOPY WITH RETROGRADE PYELOGRAM/URETERAL STENT PLACEMENT;  Surgeon: Abbie Sons, MD;  Location: [facility];  Service: Urology;  Laterality: Left;  . CYSTOSCOPY WITH STENT PLACEMENT Left 04/23/2019   Procedure: CYSTOSCOPY WITH LEFT STENT EXCHANGE;  Surgeon: Abbie Sons, MD;  Location: [facility];  Service: Urology;  Laterality: Left;  . IR IMAGING GUIDED PORT INSERTION  04/06/2019  . MASS BIOPSY Left 04/10/2019   Procedure: NECK MASS BIOPSY LEFT;  Surgeon: Jules Husbands, MD;  Location: [facility];  Service: General;  Laterality: Left;  . POLYPECTOMY  02/25/2017   Procedure: POLYPECTOMY;  Surgeon: Lucilla Lame, MD;  Location: [place];  Service: Endoscopy;;  . PORT A CATH INJECTION ([place] HX)    . ROTATOR CUFF REPAIR Right   . TONSILLECTOMY  1972    Social History   Socioeconomic History  . Marital status: Married    Spouse name: Not on file  . Number of children: Not on file  . Years of education: Not on file  . Highest education level: Not on file  Occupational History  . Not on file  Social Needs  . Financial resource strain: Not on file  . Food insecurity    Worry: Not on file    Inability: Not on file  . Transportation needs    Medical: Not on file    Non-medical: Not on file  Tobacco Use  . Smoking status: Never Smoker  . Smokeless tobacco: Never Used  Substance and Sexual Activity  . Alcohol use: No  . Drug use: No  . Sexual activity: Yes  Lifestyle  . Physical activity    Days per week: Not on file    Minutes per session: Not on file  . Stress: Not on file   Relationships  . Social Herbalist on phone: Not on file    Gets together: Not on file    Attends religious service: Not on file    Active member of club or organization: Not on file    Attends meetings of clubs or organizations: Not on file    Relationship status: Not on file  . Intimate partner violence    Fear of current or ex partner: Not on file    Emotionally abused: Not on file    Physically abused: Not on file    Forced sexual activity: Not on file  Other Topics Concern  . Not on file  Social History Narrative  . Not on file    Family History  Problem Relation Age of Onset  . Alcohol abuse Maternal Grandfather   . Healthy Mother   . Cancer Father   . Healthy Sister   . Healthy Brother   . Breast cancer Maternal Aunt   . Hyperlipidemia Neg Hx   . Heart disease Neg Hx   . Diabetes Neg Hx      Current Outpatient Medications:  .  acyclovir (ZOVIRAX) 400 MG tablet, Take 1 tablet (400 mg total) by mouth daily., Disp: 30 tablet, Rfl: 3 .  Cholecalciferol (VITAMIN D3 PO), Take 20,000 mcg by mouth daily., Disp: , Rfl:  .  lidocaine-prilocaine (EMLA) cream, Apply to affected area once, Disp: 30 g, Rfl: 3 .  Menaquinone-7 (VITAMIN K2 PO), Take 200 mcg by mouth daily., Disp: , Rfl:  .  Multiple Vitamin (MULTI-VITAMIN DAILY PO), Take 1 tablet by mouth daily. , Disp: , Rfl:  .  polyethylene glycol (MIRALAX / GLYCOLAX) 17 g packet, Take 17 g by mouth daily., Disp: , Rfl:  .  Probiotic Product (PROBIOTIC PO), Take 1 capsule by mouth daily., Disp: , Rfl:  .  triamcinolone ointment (KENALOG) 0.5 %, Apply 1 application topically 2 (two) times daily., Disp: 30 g, Rfl: 2 .  HYDROcodone-acetaminophen (NORCO) 10-325 MG tablet, Take 1 tablet by mouth every 6 (six) hours as needed. (Patient not taking: Reported on 09/04/2019), Disp: 90 tablet, Rfl: 0 .  LORazepam (ATIVAN) 0.5 MG tablet, Take 1 tablet (0.5 mg total) by mouth every 6 (six) hours as needed (Nausea or vomiting).  (Patient not taking: Reported on 09/04/2019), Disp: 30 tablet, Rfl: 0 .  prochlorperazine (COMPAZINE) 10 MG tablet, Take 1 tablet (10 mg total) by mouth every 6 (six) hours as needed (Nausea or vomiting). (Patient not taking: Reported on 09/04/2019), Disp: 30 tablet, Rfl: 1  Physical exam:  Vitals:   09/07/19 0849  BP: 102/77  Pulse: 87  Resp: 16  Temp: 98.3 F (36.8 C)  TempSrc: Tympanic  Weight: 194 lb 11.2 oz (88.3 kg)  Height: _0  (1.676 m)   Physical Exam HENT:     Head: Normocephalic and atraumatic.  Eyes:     Pupils: Pupils are equal, round, and reactive to light.  Neck:     Musculoskeletal: Normal range of motion.  Cardiovascular:     Rate and Rhythm: Normal rate and regular rhythm.     Heart sounds: Normal heart sounds.  Pulmonary:     Effort: Pulmonary effort is normal.     Breath sounds: Normal breath sounds.  Abdominal:     General: Bowel sounds are normal.     Palpations: Abdomen is soft.  Lymphadenopathy:     Comments: No palpable cervical, supraclavicular, axillary or inguinal adenopathy   Skin:    General: Skin is warm and dry.  Neurological:     Mental Status: She is alert and oriented to person, place, and time.      CMP Latest Ref Rng & Units 09/07/2019  Glucose 70 - 99 mg/dL 101(H)  BUN 8 - 23 mg/dL 19  Creatinine 0.44 - 1.00 mg/dL 0.73  Sodium 135 - 145 mmol/L 138  Potassium 3.5 - 5.1 mmol/L 3.8  Chloride 98 - 111 mmol/L 105  CO2 22 - 32 mmol/L 27  Calcium 8.9 - 10.3 mg/dL 8.9  Total Protein 6.5 - 8.1 g/dL 6.9  Total Bilirubin 0.3 - 1.2 mg/dL 1.2  Alkaline Phos 38 - 126 U/L 76  AST 15 - 41 U/L 25  ALT 0 - 44 U/L 29   CBC Latest Ref Rng & Units 09/07/2019  WBC 4.0 - 10.5 K/uL 4.8  Hemoglobin 12.0 - 15.0 g/dL 12.8  Hematocrit 36.0 - 46.0 % 38.0  Platelets 150 - 400 K/uL 180     Assessment and plan- Patient is a 63 y.o. female with stage III grade 3A follicular lymphoma.   She is here for on treatment assessment prior to cycle 6-day 1 of  bendamustine and Rituxan chemotherapy  Counts okay to proceed with cycle 6-day 1 of bendamustine  Rituxan chemotherapy today and bendamustine tomorrow.  She has not required any growth factor support so far.  This will be her last cycle of BR chemotherapy  Plan to get PET CT scan in 2 weeks time and I will see him back in 3 weeks with CBC with differential, CMP and LDH.  We again discussed about controversy regarding proceeding with maintenance Rituxan every 2 months for 2 years.  Prima study showed improvement in progression free survival at 3 years 75% versus 58% in patients who received Rituxan but no improvement in overall survival.  Again maintenance Rituxan was not studied after BR.  Patient is also concerned about cost involved with maintenance Rituxan and chooses not to proceed with it at this time.  She would also like to get her port removed after PET scan.  I will discuss all this in more detail when I see her next after PET scan  Patient will continue acyclovir prophylaxis at this time     Visit Diagnosis 1. Follicular lymphoma grade IIIa of intra-abdominal lymph nodes ([place])   2. Encounter for antineoplastic chemotherapy   3. Visit for monitoring Rituxan therapy      Dr. Randa Evens, MD, MPH [facility] [phone] 09/08/2019 8:15 AM

## 2019-09-21 ENCOUNTER — Other Ambulatory Visit: Payer: Self-pay | Admitting: *Deleted

## 2019-09-21 DIAGNOSIS — Z452 Encounter for adjustment and management of vascular access device: Secondary | ICD-10-CM

## 2019-09-22 ENCOUNTER — Telehealth: Payer: Self-pay

## 2019-09-22 NOTE — Telephone Encounter (Signed)
Called patient to let her know about her appointment for her port-a-cath removal on 10/02/2019 and to arrive at the [facility] at 7:00 AM. Patient agreed.

## 2019-09-28 ENCOUNTER — Other Ambulatory Visit: Payer: Self-pay

## 2019-09-28 ENCOUNTER — Ambulatory Visit
Admission: RE | Admit: 2019-09-28 | Discharge: 2019-09-28 | Disposition: A | Discharge status: Home / Self Care | Payer: BLUE CROSS/BLUE SHIELD | Source: Ambulatory Visit | Attending: Oncology | Admitting: Oncology

## 2019-09-28 DIAGNOSIS — C8233 Follicular lymphoma grade IIIa, intra-abdominal lymph nodes: Secondary | ICD-10-CM | POA: Insufficient documentation

## 2019-09-28 DIAGNOSIS — Z9221 Personal history of antineoplastic chemotherapy: Secondary | ICD-10-CM | POA: Diagnosis not present

## 2019-09-28 LAB — GLUCOSE, CAPILLARY: Glucose-Capillary: 97 mg/dL (ref 70–99)

## 2019-09-28 MED ORDER — FLUDEOXYGLUCOSE F - 18 (FDG) INJECTION
10.1000 | Freq: Once | INTRAVENOUS | Status: AC | PRN
  Administered 2019-09-28: 09:00:00 10.1 via INTRAVENOUS

## 2019-09-29 ENCOUNTER — Other Ambulatory Visit: Payer: Self-pay | Admitting: Oncology

## 2019-09-29 ENCOUNTER — Ambulatory Visit: Payer: BLUE CROSS/BLUE SHIELD | Admitting: Oncology

## 2019-09-29 ENCOUNTER — Other Ambulatory Visit: Admission: RE | Admit: 2019-09-29 | Payer: BLUE CROSS/BLUE SHIELD | Source: Ambulatory Visit

## 2019-09-29 ENCOUNTER — Other Ambulatory Visit: Payer: BLUE CROSS/BLUE SHIELD

## 2019-09-30 ENCOUNTER — Inpatient Hospital Stay: Payer: BLUE CROSS/BLUE SHIELD | Attending: Oncology

## 2019-09-30 ENCOUNTER — Encounter: Payer: Self-pay | Admitting: Oncology

## 2019-09-30 ENCOUNTER — Other Ambulatory Visit: Payer: Self-pay

## 2019-09-30 DIAGNOSIS — Z9221 Personal history of antineoplastic chemotherapy: Secondary | ICD-10-CM | POA: Insufficient documentation

## 2019-09-30 DIAGNOSIS — C8223 Follicular lymphoma grade III, unspecified, intra-abdominal lymph nodes: Secondary | ICD-10-CM | POA: Diagnosis present

## 2019-09-30 DIAGNOSIS — Z79899 Other long term (current) drug therapy: Secondary | ICD-10-CM | POA: Insufficient documentation

## 2019-09-30 DIAGNOSIS — C8233 Follicular lymphoma grade IIIa, intra-abdominal lymph nodes: Secondary | ICD-10-CM

## 2019-09-30 LAB — COMPREHENSIVE METABOLIC PANEL
ALT: 38 U/L (ref 0–44)
AST: 27 U/L (ref 15–41)
Albumin: 4.1 g/dL (ref 3.5–5.0)
Alkaline Phosphatase: 95 U/L (ref 38–126)
Anion gap: 3 — ABNORMAL LOW (ref 5–15)
BUN: 12 mg/dL (ref 8–23)
CO2: 26 mmol/L (ref 22–32)
Calcium: 8.4 mg/dL — ABNORMAL LOW (ref 8.9–10.3)
Chloride: 107 mmol/L (ref 98–111)
Creatinine, Ser: 0.75 mg/dL (ref 0.44–1.00)
GFR calc Af Amer: >60 mL/min (ref >60)
GFR calc non Af Amer: >60 mL/min (ref >60)
Glucose, Bld: 91 mg/dL (ref 70–99)
Potassium: 3.8 mmol/L (ref 3.5–5.1)
Sodium: 136 mmol/L (ref 135–145)
Total Bilirubin: 1.2 mg/dL (ref 0.3–1.2)
Total Protein: 6.4 g/dL — ABNORMAL LOW (ref 6.5–8.1)

## 2019-09-30 LAB — CBC WITH DIFFERENTIAL/PLATELET
Abs Immature Granulocytes: 0.02 10*3/uL (ref 0.00–0.07)
Basophils Absolute: 0 10*3/uL (ref 0.0–0.1)
Basophils Relative: 1 %
Eosinophils Absolute: 0.1 10*3/uL (ref 0.0–0.5)
Eosinophils Relative: 3 %
HCT: 36.2 % (ref 36.0–46.0)
Hemoglobin: 11.8 g/dL — ABNORMAL LOW (ref 12.0–15.0)
Immature Granulocytes: 1 %
Lymphocytes Relative: 6 %
Lymphs Abs: 0.2 10*3/uL — ABNORMAL LOW (ref 0.7–4.0)
MCH: 30.3 pg (ref 26.0–34.0)
MCHC: 32.6 g/dL (ref 30.0–36.0)
MCV: 93.1 fL (ref 80.0–100.0)
Monocytes Absolute: 0.6 10*3/uL (ref 0.1–1.0)
Monocytes Relative: 17 %
Neutro Abs: 2.6 10*3/uL (ref 1.7–7.7)
Neutrophils Relative %: 72 %
Platelets: 174 10*3/uL (ref 150–400)
RBC: 3.89 MIL/uL (ref 3.87–5.11)
RDW: 12.6 % (ref 11.5–15.5)
WBC: 3.6 10*3/uL — ABNORMAL LOW (ref 4.0–10.5)
nRBC: 0 % (ref 0.0–0.2)

## 2019-09-30 LAB — LACTATE DEHYDROGENASE: LDH: 119 U/L (ref 98–192)

## 2019-09-30 NOTE — Progress Notes (Signed)
Patient stated that she had been doing well with no complaints. However, patient stated that starting next year her insurance will no longer cover Andrea Lloyd, therefore, she is not sure what would happen.

## 2019-10-01 ENCOUNTER — Other Ambulatory Visit: Payer: Self-pay | Admitting: *Deleted

## 2019-10-01 ENCOUNTER — Inpatient Hospital Stay (HOSPITAL_BASED_OUTPATIENT_CLINIC_OR_DEPARTMENT_OTHER): Payer: BLUE CROSS/BLUE SHIELD | Admitting: Oncology

## 2019-10-01 ENCOUNTER — Other Ambulatory Visit: Payer: BLUE CROSS/BLUE SHIELD

## 2019-10-01 ENCOUNTER — Inpatient Hospital Stay: Payer: BLUE CROSS/BLUE SHIELD

## 2019-10-01 DIAGNOSIS — R948 Abnormal results of function studies of other organs and systems: Secondary | ICD-10-CM

## 2019-10-01 DIAGNOSIS — C8233 Follicular lymphoma grade IIIa, intra-abdominal lymph nodes: Secondary | ICD-10-CM

## 2019-10-01 NOTE — Progress Notes (Signed)
Tumor Board Documentation  Andrea Lloyd was presented by Dr Janese Banks at our Tumor Board on 10/01/2019, which included representatives from medical oncology, radiation oncology, navigation, pathology, radiology, surgical, internal medicine, genetics, pulmonology, palliative care, research.  Andrea Lloyd currently presents as a current patient, for discussion with history of the following treatments: active survellience, neoadjuvant chemotherapy, immunotherapy.  Additionally, we reviewed previous medical and familial history, history of present illness, and recent lab results along with all available histopathologic and imaging studies. The tumor board considered available treatment options and made the following recommendations: Active surveillance    The following procedures/referrals were also placed: No orders of the defined types were placed in this encounter.   Clinical Trial Status: not discussed   Staging used: Not Applicable  National site-specific guidelines   were discussed with respect to the case.  Tumor board is a meeting of clinicians from various specialty areas who evaluate and discuss patients for whom a multidisciplinary approach is being considered. Final determinations in the plan of care are those of the provider(s). The responsibility for follow up of recommendations given during tumor board is that of the provider.   Today's extended care, comprehensive team conference, Andrea Lloyd was not present for the discussion and was not examined.   Multidisciplinary Tumor Board is a multidisciplinary case peer review process.  Decisions discussed in the Multidisciplinary Tumor Board reflect the opinions of the specialists present at the conference without having examined the patient.  Ultimately, treatment and diagnostic decisions rest with the primary provider(s) and the patient.

## 2019-10-02 ENCOUNTER — Other Ambulatory Visit: Payer: Self-pay | Admitting: *Deleted

## 2019-10-02 ENCOUNTER — Ambulatory Visit: Admission: RE | Admit: 2019-10-02 | Payer: BLUE CROSS/BLUE SHIELD | Source: Ambulatory Visit

## 2019-10-02 DIAGNOSIS — R948 Abnormal results of function studies of other organs and systems: Secondary | ICD-10-CM

## 2019-10-02 DIAGNOSIS — C8233 Follicular lymphoma grade IIIa, intra-abdominal lymph nodes: Secondary | ICD-10-CM

## 2019-10-02 NOTE — Progress Notes (Signed)
I connected with Andrea Lloyd on 10/02/19 at  9:30 AM EST by video enabled telemedicine visit and verified that I am speaking with the correct person using two identifiers.   I discussed the limitations, risks, security and privacy concerns of performing an evaluation and management service by telemedicine and the availability of in-person appointments. I also discussed with the patient that there may be a patient responsible charge related to this service. The patient expressed understanding and agreed to proceed.  Other persons participating in the visit and their role in the encounter:  none  Patient's location:  home Provider's location:  work  Risk analyst Complaint: Discuss PET CT scan results and further management  Diagnosis:stage III grade 3A follicular lymphoma  History of present illness: patient is a29 year old female who was seen by [facility] for evaluation of left lower quadrant abdominal pain which has been ongoing for the last few monthsShe has a history of hysterectomy/sacral colpopexy/ vaginal mesh in 2014.   2. This was followed by a CT of the abdomen which showed: Multiple enlarged abdominal and left common iliac nodes. Findings are worrisome for either metastatic adenopathy from a abdominal primary versus lymphoproliferative disorder. Consider further investigation with PET-CT and tissue sampling.  3. PET/CT scan from 01/08/2017 showed:Hypermetabolic upper abdominal and retroperitoneal lymph nodes, measuring up to 1.9 cm short axis, worrisome for nodal metastases versus lymphoma.  Hypermetabolic 7 mm short axis node at the left thoracic inlet, worrisome for lymphatic spread via the thoracic duct.  4. Tumor markers including CEA and CA-19-9 was normal at 1 and 8 respectively.  5. She is otherwise doing well and denies any complaints of unintentional weight loss, loss of appetite, fevers or chills or drenching night sweats. Her abdominal pain is mostly  associated with eating and sometimes comes on a day later. It is mainly in her left lower quadrant and is a dull aching pain but sometimes she seems to have pain in the epigastrium and right upper quadrant  6. Patient underwent IR guided retroperitoneal LN biopsy which showed : DIAGNOSIS:  A. LYMPH NODE, LEFT RETROPERITONEAL; CT-GUIDED BIOPSY:  - FOLLICULAR LYMPHOMA, GRADE 1-2.  7. Bone marrow biopsy negative for lymphoma. Normal karyotype and cytogenetics  8.Patient underwent excisional lymph node biopsy of the left supraclavicular lymph node.Pathology showed Grdae 3A follicular lymphoma. No diffuse pattern was seen.Patient was started on bendamustine Rituxan chemotherapy on 04/17/2019. Interim scans after 3 cyclesshowed excellent response to treatment    Interval history overall patient feels well and denies any complaints at this time other than chronic fatigue   Review of Systems  Constitutional: Positive for malaise/fatigue. Negative for chills, fever and weight loss.  HENT: Negative for congestion, ear discharge and nosebleeds.   Eyes: Negative for blurred vision.  Respiratory: Negative for cough, hemoptysis, sputum production, shortness of breath and wheezing.   Cardiovascular: Negative for chest pain, palpitations, orthopnea and claudication.  Gastrointestinal: Negative for abdominal pain, blood in stool, constipation, diarrhea, heartburn, melena, nausea and vomiting.  Genitourinary: Negative for dysuria, flank pain, frequency, hematuria and urgency.  Musculoskeletal: Negative for back pain, joint pain and myalgias.  Skin: Negative for rash.  Neurological: Negative for dizziness, tingling, focal weakness, seizures, weakness and headaches.  Endo/Heme/Allergies: Does not bruise/bleed easily.  Psychiatric/Behavioral: Negative for depression and suicidal ideas. The patient does not have insomnia.     Allergies  Allergen Reactions  . Oxycodone Hives  . Adhesive [Tape]  Rash    Bandaids    Past Medical History:  Diagnosis  Date  . Asthma   . Family history of adverse reaction to anesthesia    Father - slow to wake  . GERD (gastroesophageal reflux disease)   . Gravida 4 para 4   . Lymphoma [facility])     Past Surgical History:  Procedure Laterality Date  . ABDOMINAL HYSTERECTOMY     06 01 2013  . BLADDER SURGERY    . COLONOSCOPY WITH PROPOFOL N/A 02/25/2017   Procedure: COLONOSCOPY WITH PROPOFOL;  Surgeon: Lucilla Lame, MD;  Location: [facility];  Service: Endoscopy;  Laterality: N/A;  . CYSTOSCOPY W/ URETERAL STENT PLACEMENT Left 03/28/2019   Procedure: CYSTOSCOPY WITH RETROGRADE PYELOGRAM/URETERAL STENT PLACEMENT;  Surgeon: Abbie Sons, MD;  Location: [facility];  Service: Urology;  Laterality: Left;  . CYSTOSCOPY WITH STENT PLACEMENT Left 04/23/2019   Procedure: CYSTOSCOPY WITH LEFT STENT EXCHANGE;  Surgeon: Abbie Sons, MD;  Location: [facility];  Service: Urology;  Laterality: Left;  . IR IMAGING GUIDED PORT INSERTION  04/06/2019  . MASS BIOPSY Left 04/10/2019   Procedure: NECK MASS BIOPSY LEFT;  Surgeon: Jules Husbands, MD;  Location: [facility];  Service: General;  Laterality: Left;  . POLYPECTOMY  02/25/2017   Procedure: POLYPECTOMY;  Surgeon: Lucilla Lame, MD;  Location: [facility];  Service: Endoscopy;;  . PORT A CATH INJECTION ([facility] [facility])    . ROTATOR CUFF REPAIR Right   . TONSILLECTOMY  1972    Social History   Socioeconomic History  . Marital status: Married    Spouse name: Not on file  . Number of children: Not on file  . Years of education: Not on file  . Highest education level: Not on file  Occupational History  . Not on file  Social Needs  . Financial resource strain: Not on file  . Food insecurity    Worry: Not on file    Inability: Not on file  . Transportation needs    Medical: Not on file    Non-medical: Not on file  Tobacco Use  . Smoking status: Never Smoker  . Smokeless tobacco: Never Used   Substance and Sexual Activity  . Alcohol use: No  . Drug use: No  . Sexual activity: Yes  Lifestyle  . Physical activity    Days per week: Not on file    Minutes per session: Not on file  . Stress: Not on file  Relationships  . Social Herbalist on phone: Not on file    Gets together: Not on file    Attends religious service: Not on file    Active member of club or organization: Not on file    Attends meetings of clubs or organizations: Not on file    Relationship status: Not on file  . Intimate partner violence    Fear of current or ex partner: Not on file    Emotionally abused: Not on file    Physically abused: Not on file    Forced sexual activity: Not on file  Other Topics Concern  . Not on file  Social History Narrative  . Not on file    Family History  Problem Relation Age of Onset  . Alcohol abuse Maternal Grandfather   . Healthy Mother   . Cancer Father   . Healthy Sister   . Healthy Brother   . Breast cancer Maternal Aunt   . Hyperlipidemia Neg Hx   . Heart disease Neg Hx   . Diabetes Neg Hx  Current Outpatient Medications:  .  acyclovir (ZOVIRAX) 400 MG tablet, Take 1 tablet (400 mg total) by mouth daily., Disp: 30 tablet, Rfl: 3 .  albuterol (VENTOLIN HFA) 108 (90 Base) MCG/ACT inhaler, Inhale 2 puffs into the lungs every 6 (six) hours as needed., Disp: , Rfl:  .  Cholecalciferol (VITAMIN D3 PO), Take 20,000 mcg by mouth daily., Disp: , Rfl:  .  lidocaine-prilocaine (EMLA) cream, Apply to affected area once, Disp: 30 g, Rfl: 3 .  Menaquinone-7 (VITAMIN K2 PO), Take 200 mcg by mouth daily., Disp: , Rfl:  .  Probiotic Product (PROBIOTIC PO), Take 1 capsule by mouth daily., Disp: , Rfl:  .  HYDROcodone-acetaminophen (NORCO) 10-325 MG tablet, Take 1 tablet by mouth every 6 (six) hours as needed. (Patient not taking: Reported on 09/30/2019), Disp: 90 tablet, Rfl: 0 .  Multiple Vitamin (MULTI-VITAMIN DAILY PO), Take 1 tablet by mouth daily. ,  Disp: , Rfl:  .  prochlorperazine (COMPAZINE) 10 MG tablet, Take 1 tablet (10 mg total) by mouth every 6 (six) hours as needed (Nausea or vomiting). (Patient not taking: Reported on 09/30/2019), Disp: 30 tablet, Rfl: 1  Nm Pet Image Restag (ps) Skull Base To Thigh  Result Date: 09/28/2019 CLINICAL DATA:  Subsequent treatment strategy for lymphoma. EXAM: NUCLEAR MEDICINE PET SKULL BASE TO THIGH TECHNIQUE: 10.13 mCi F-18 FDG was injected intravenously. Full-ring PET imaging was performed from the skull base to thigh after the radiotracer. CT data was obtained and used for attenuation correction and anatomic localization. Fasting blood glucose: 97 mg/dl COMPARISON:  06/29/2019 FINDINGS: Mediastinal blood pool activity: SUV max 2.71 Liver activity: SUV max 3.94 NECK: No hypermetabolic lymph nodes in the neck. Incidental CT findings: none CHEST: No hypermetabolic mediastinal or hilar nodes. No suspicious pulmonary nodules on the CT scan. Incidental CT findings: Small less than 5 mm anterolateral right upper lobe lung nodule is unchanged, image 80/3. Too small to reliably characterize. ABDOMEN/PELVIS: No abnormal uptake within the liver, pancreas, spleen, or adrenal glands. Residual left periaortic soft tissue is again identified. This encases the infrarenal abdominal aorta measuring 5.1 x 3.0 cm, image 162/3. The SUV max is equal to 2.95 compatible with treated tumor. On the previous examination this measured 5.3 x 3.0 cm and had an SUV max of 3.12. No new sites of disease identified within the abdomen or pelvis. Incidental CT findings: Interval removal of left-sided nephroureteral stent. No hydronephrosis identified at this time. SKELETON: No focal hypermetabolic activity to suggest skeletal metastasis. Previous area of soft tissue infiltration within the subcutaneous fat overlying the left gluteus muscle is again noted. On today's study this measures 5.4 x 1.6 cm and has an SUV max of 2.43. On the previous exam  this measured 5.5 x 3.0 cm and had an SUV max of 3.67. Incidental CT findings: none IMPRESSION: 1. Continued decrease in size and FDG uptake associated with residual retroperitoneal soft tissue/adenopathy. Findings compatible treated disease, Deauville criteria 2/3. 2. No new sites of disease. Electronically Signed   By: Kerby Moors M.D.   On: 09/28/2019 11:49    No images are attached to the encounter.   CMP Latest Ref Rng & Units 09/30/2019  Glucose 70 - 99 mg/dL 91  BUN 8 - 23 mg/dL 12  Creatinine 0.44 - 1.00 mg/dL 0.75  Sodium 135 - 145 mmol/L 136  Potassium 3.5 - 5.1 mmol/L 3.8  Chloride 98 - 111 mmol/L 107  CO2 22 - 32 mmol/L 26  Calcium 8.9 - 10.3 mg/dL  8.4(L)  Total Protein 6.5 - 8.1 g/dL 6.4(L)  Total Bilirubin 0.3 - 1.2 mg/dL 1.2  Alkaline Phos 38 - 126 U/L 95  AST 15 - 41 U/L 27  ALT 0 - 44 U/L 38   CBC Latest Ref Rng & Units 09/30/2019  WBC 4.0 - 10.5 K/uL 3.6(L)  Hemoglobin 12.0 - 15.0 g/dL 11.8(L)  Hematocrit 36.0 - 46.0 % 36.2  Platelets 150 - 400 K/uL 174     Observation/objective: Appears in no acute distress on video visit today.  Breathing is nonlabored  Assessment and plan: Patient is a 63 year old female with a history of stage III grade 3A follicular lymphoma s/p 6 cycles of bendamustine and Rituxan here to discuss the results of PET CT scan for further management  I have reviewed PET/CT scan images independently and discussed findings with the patient.  We also discussed patient's case at tumor board after my visit with her.  At diagnosis patient was found to haveA dominant retroperitoneal mass measuring 12.3 cm with an SUV of 23.9 she was also noted to have enlarged retrocrural lymph nodes up to 4 cm with an SUV of 18.5 enlarged left supraclavicular lymph node 1.9 cm with an SUV of 17 and all of them had is Deauville score of 5  After 6 cycles of treatment the retroperitoneal mass has come down to 5. 4 cm with an SUV of 2.9.  With a Deauville score of 2/3.   No hypermetabolism seen in the area of left supraclavicular lymph node which was excised as well as retrocrural area.  Patient had 2 biopsies at the time of diagnosis.  First 1 was a core biopsy of the retroperitoneal mass which has the highest SUV and was consistent with at least grade 3A  follicular lymphoma.  A repeat excisional biopsy of the left supraclavicular lymph node also showed grade 3A follicular lymphoma and there was no evidence of a higher component such as a grade 3B or diffuse large B-cell lymphoma  Discussed with the patient that she may continue to have residual mass which may have some uptake but that does not necessarily indicate active tumor.  However given that the reported Deauville score is 2/3 I would like to get a repeat biopsy of this area.  If core biopsy is negative it still does not completely rule out active lymphoma however if it is positive it would change management.  I would like to hold off on her port removal at this time and see her back after the biopsy is back  We also discussed her case with radiation oncology who does not feel that radiation to this area of retroperitoneum would be warranted at this time.  Regardless she would need a close follow-up with a repeat head CT scan or CT scan in 3 months to see if there is any change in this retroperitoneal mass.  Patient has various financial constraints and prefers to get a CT over a PET CT in 3 months.  We again discussed the role of maintenance Rituxan as per the prima study and patient is not interested in receiving maintenance Rituxan at this time due to cost issues  Patient is also unsure if she would be able to follow-up with me starting January given changes in her insurance and we will look into this as well.  If patient is unable to follow-up and needs to go to [facility] for follow-up I would like to set that up at this time so she can follow-up  with their plan of care sooner than later   Follow-up  instructions: Follow-up to be decided after repeat biopsy results are back  I discussed the assessment and treatment plan with the patient. The patient was provided an opportunity to ask questions and all were answered. The patient agreed with the plan and demonstrated an understanding of the instructions.   The patient was advised to call back or seek an in-person evaluation if the symptoms worsen or if the condition fails to improve as anticipated.   Visit Diagnosis: 1. Follicular lymphoma grade IIIa of intra-abdominal lymph nodes (HCC)     Dr. Randa Evens, MD, MPH [facility] at [facility] Pager- [phone] 10/02/2019 10:16 AM

## 2019-10-10 ENCOUNTER — Other Ambulatory Visit: Payer: Self-pay | Admitting: Radiology

## 2019-10-12 ENCOUNTER — Other Ambulatory Visit: Payer: Self-pay | Admitting: Student

## 2019-10-13 ENCOUNTER — Ambulatory Visit
Admission: RE | Admit: 2019-10-13 | Discharge: 2019-10-13 | Disposition: A | Discharge status: Home / Self Care | Payer: BLUE CROSS/BLUE SHIELD | Source: Ambulatory Visit | Attending: Oncology | Admitting: Oncology

## 2019-10-13 ENCOUNTER — Other Ambulatory Visit: Payer: Self-pay

## 2019-10-13 DIAGNOSIS — C8233 Follicular lymphoma grade IIIa, intra-abdominal lymph nodes: Secondary | ICD-10-CM | POA: Diagnosis not present

## 2019-10-13 DIAGNOSIS — R590 Localized enlarged lymph nodes: Secondary | ICD-10-CM | POA: Diagnosis not present

## 2019-10-13 DIAGNOSIS — R948 Abnormal results of function studies of other organs and systems: Secondary | ICD-10-CM | POA: Insufficient documentation

## 2019-10-13 LAB — CBC
HCT: 40.4 % (ref 36.0–46.0)
Hemoglobin: 13.4 g/dL (ref 12.0–15.0)
MCH: 30.3 pg (ref 26.0–34.0)
MCHC: 33.2 g/dL (ref 30.0–36.0)
MCV: 91.4 fL (ref 80.0–100.0)
Platelets: 227 10*3/uL (ref 150–400)
RBC: 4.42 MIL/uL (ref 3.87–5.11)
RDW: 12.9 % (ref 11.5–15.5)
WBC: 6.1 10*3/uL (ref 4.0–10.5)
nRBC: 0 % (ref 0.0–0.2)

## 2019-10-13 LAB — PROTIME-INR
INR: 0.9 (ref 0.8–1.2)
Prothrombin Time: 12.1 seconds (ref 11.4–15.2)

## 2019-10-13 MED ORDER — HEPARIN SOD (PORK) LOCK FLUSH 100 UNIT/ML IV SOLN
INTRAVENOUS | Status: AC
  Filled 2019-10-13: qty 5

## 2019-10-13 MED ORDER — FENTANYL CITRATE (PF) 100 MCG/2ML IJ SOLN
INTRAMUSCULAR | Status: AC | PRN
  Administered 2019-10-13 (×2): 50 ug via INTRAVENOUS

## 2019-10-13 MED ORDER — FENTANYL CITRATE (PF) 100 MCG/2ML IJ SOLN
INTRAMUSCULAR | Status: AC
  Filled 2019-10-13: qty 2

## 2019-10-13 MED ORDER — MIDAZOLAM HCL 5 MG/5ML IJ SOLN
INTRAMUSCULAR | Status: AC | PRN
  Administered 2019-10-13 (×2): 1 mg via INTRAVENOUS

## 2019-10-13 MED ORDER — MIDAZOLAM HCL 5 MG/5ML IJ SOLN
INTRAMUSCULAR | Status: AC
  Filled 2019-10-13: qty 5

## 2019-10-13 MED ORDER — SODIUM CHLORIDE 0.9 % IV SOLN: INTRAVENOUS | Status: DC

## 2019-10-13 NOTE — Procedures (Signed)
Interventional Radiology Procedure:   Indications: Follicular lymphoma and evaluating for residual disease  Procedure: CT guided biopsy of left retroperitoneal lymph node  Findings: 5 cores performed and 2 adequate specimens obtained.  Placed in saline.  Complications: None     EBL: less than 10 ml  Plan: Bedrest 2 hours.    Andrea Lloyd R. Anselm Pancoast, MD  Pager: [phone]

## 2019-10-13 NOTE — Sedation Documentation (Signed)
Total conscious sedation: Versed 2 mg IV, Fentanyl 100 mcg IV. Pt. Tolerated procedure well.  

## 2019-10-13 NOTE — Consult Note (Signed)
Chief Complaint: Patient was seen in consultation today for CT-guided retroperitoneal lymph node biopsy at the request of Rao,Archana C  Referring Physician(s): Rao,Archana   Patient Status: [facility] - Out-pt  History of Present Illness: Andrea Lloyd is a 63 y.o. female with history of follicular lymphoma grade 3A.  Patient is known to interventional radiology and has had previous CT-guided retroperitoneal and bone marrow biopsies.  Recent PET/CT imaging demonstrates significant decrease in the retroperitoneal lymphadenopathy but there is still a small amount of retroperitoneal soft tissue and reported Deauville score is 2-3.  Patient has been referred for CT-guided retroperitoneal lymph node biopsy to rule out active lymphoma.  Patient has no complaints today.  Past Medical History:  Diagnosis Date  . Asthma   . Family history of adverse reaction to anesthesia    Father - slow to wake  . Gravida 4 para 4   . Lymphoma [facility])     Past Surgical History:  Procedure Laterality Date  . ABDOMINAL HYSTERECTOMY     06 01 2013  . BLADDER SURGERY    . COLONOSCOPY WITH PROPOFOL N/A 02/25/2017   Procedure: COLONOSCOPY WITH PROPOFOL;  Surgeon: Lucilla Lame, MD;  Location: [place];  Service: Endoscopy;  Laterality: N/A;  . CYSTOSCOPY W/ URETERAL STENT PLACEMENT Left 03/28/2019   Procedure: CYSTOSCOPY WITH RETROGRADE PYELOGRAM/URETERAL STENT PLACEMENT;  Surgeon: Abbie Sons, MD;  Location: [facility];  Service: Urology;  Laterality: Left;  . CYSTOSCOPY WITH STENT PLACEMENT Left 04/23/2019   Procedure: CYSTOSCOPY WITH LEFT STENT EXCHANGE;  Surgeon: Abbie Sons, MD;  Location: [facility];  Service: Urology;  Laterality: Left;  . IR IMAGING GUIDED PORT INSERTION  04/06/2019  . MASS BIOPSY Left 04/10/2019   Procedure: NECK MASS BIOPSY LEFT;  Surgeon: Jules Husbands, MD;  Location: [facility];  Service: General;  Laterality: Left;  . POLYPECTOMY  02/25/2017   Procedure: POLYPECTOMY;   Surgeon: Lucilla Lame, MD;  Location: [place];  Service: Endoscopy;;  . PORT A CATH INJECTION ([place] [facility])    . ROTATOR CUFF REPAIR Right   . TONSILLECTOMY  1972    Allergies: Oxycodone and Adhesive [tape]  Medications: Prior to Admission medications   Medication Sig Start Date End Date Taking? Authorizing Provider  albuterol (VENTOLIN HFA) 108 (90 Base) MCG/ACT inhaler Inhale 2 puffs into the lungs every 6 (six) hours as needed. 08/31/19  Yes [provider]  Cholecalciferol (VITAMIN D3 PO) Take 20,000 mcg by mouth daily.   Yes [provider]  Menaquinone-7 (VITAMIN K2 PO) Take 200 mcg by mouth daily.   Yes [provider]  Multiple Vitamin (MULTI-VITAMIN DAILY PO) Take 1 tablet by mouth daily.    Yes [provider]  Probiotic Product (PROBIOTIC PO) Take 1 capsule by mouth daily.   Yes [provider]  acyclovir (ZOVIRAX) 400 MG tablet Take 1 tablet (400 mg total) by mouth daily. Patient not taking: Reported on 10/13/2019 07/13/19   Sindy Guadeloupe, MD  HYDROcodone-acetaminophen [facility]) 10-325 MG tablet Take 1 tablet by mouth every 6 (six) hours as needed. Patient not taking: Reported on 09/30/2019 04/30/19   Jacquelin Hawking, NP  lidocaine-prilocaine (EMLA) cream Apply to affected area once 04/14/19   Sindy Guadeloupe, MD  prochlorperazine (COMPAZINE) 10 MG tablet Take 1 tablet (10 mg total) by mouth every 6 (six) hours as needed (Nausea or vomiting). Patient not taking: Reported on 09/30/2019 04/14/19   Sindy Guadeloupe, MD  Family History  Problem Relation Age of Onset  . Alcohol abuse Maternal Grandfather   . Healthy Mother   . Cancer Father   . Healthy Sister   . Healthy Brother   . Breast cancer Maternal Aunt   . Hyperlipidemia Neg Hx   . Heart disease Neg Hx   . Diabetes Neg Hx     Social History   Socioeconomic History  . Marital status: Married    Spouse name: Not on file  . Number of children: Not on file  . Years  of education: Not on file  . Highest education level: Not on file  Occupational History  . Not on file  Tobacco Use  . Smoking status: Never Smoker  . Smokeless tobacco: Never Used  Substance and Sexual Activity  . Alcohol use: No  . Drug use: No  . Sexual activity: Yes  Other Topics Concern  . Not on file  Social History Narrative  . Not on file   Social Determinants of Health   Financial Resource Strain:   . Difficulty of Paying Living Expenses: Not on file  Food Insecurity:   . Worried About Charity fundraiser in the Last Year: Not on file  . Ran Out of Food in the Last Year: Not on file  Transportation Needs:   . Lack of Transportation (Medical): Not on file  . Lack of Transportation (Non-Medical): Not on file  Physical Activity:   . Days of Exercise per Week: Not on file  . Minutes of Exercise per Session: Not on file  Stress:   . Feeling of Stress : Not on file  Social Connections:   . Frequency of Communication with Friends and Family: Not on file  . Frequency of Social Gatherings with Friends and Family: Not on file  . Attends Religious Services: Not on file  . Active Member of Clubs or Organizations: Not on file  . Attends Archivist Meetings: Not on file  . Marital Status: Not on file    Review of Systems: A 12 point ROS discussed and pertinent positives are indicated in the HPI above.  All other systems are negative.  Review of Systems  Constitutional: Negative.   Respiratory: Negative.   Cardiovascular: Negative.   Gastrointestinal: Negative.   Genitourinary: Negative.     Vital Signs: BP 106/70   Pulse 87   Temp 98.3 F (36.8 C) (Oral)   Resp 18   Ht 5\' 6"  (1.676 m)   SpO2 95%   BMI 31.43 kg/m   Physical Exam Constitutional:      Appearance: Normal appearance. She is not ill-appearing.  Cardiovascular:     Rate and Rhythm: Normal rate and regular rhythm.  Pulmonary:     Effort: Pulmonary effort is normal.     Breath sounds:  Normal breath sounds.  Abdominal:     General: Abdomen is flat. Bowel sounds are normal.     Palpations: Abdomen is soft.  Neurological:     Mental Status: She is alert.     Imaging: NM PET Image Restag (PS) Skull Base To Thigh  Result Date: 09/28/2019 CLINICAL DATA:  Subsequent treatment strategy for lymphoma. EXAM: NUCLEAR MEDICINE PET SKULL BASE TO THIGH TECHNIQUE: 10.13 mCi F-18 FDG was injected intravenously. Full-ring PET imaging was performed from the skull base to thigh after the radiotracer. CT data was obtained and used for attenuation correction and anatomic localization. Fasting blood glucose: 97 mg/dl COMPARISON:  06/29/2019 FINDINGS: Mediastinal blood pool  activity: SUV max 2.71 Liver activity: SUV max 3.94 NECK: No hypermetabolic lymph nodes in the neck. Incidental CT findings: none CHEST: No hypermetabolic mediastinal or hilar nodes. No suspicious pulmonary nodules on the CT scan. Incidental CT findings: Small less than 5 mm anterolateral right upper lobe lung nodule is unchanged, image 80/3. Too small to reliably characterize. ABDOMEN/PELVIS: No abnormal uptake within the liver, pancreas, spleen, or adrenal glands. Residual left periaortic soft tissue is again identified. This encases the infrarenal abdominal aorta measuring 5.1 x 3.0 cm, image 162/3. The SUV max is equal to 2.95 compatible with treated tumor. On the previous examination this measured 5.3 x 3.0 cm and had an SUV max of 3.12. No new sites of disease identified within the abdomen or pelvis. Incidental CT findings: Interval removal of left-sided nephroureteral stent. No hydronephrosis identified at this time. SKELETON: No focal hypermetabolic activity to suggest skeletal metastasis. Previous area of soft tissue infiltration within the subcutaneous fat overlying the left gluteus muscle is again noted. On today's study this measures 5.4 x 1.6 cm and has an SUV max of 2.43. On the previous exam this measured 5.5 x 3.0 cm and  had an SUV max of 3.67. Incidental CT findings: none IMPRESSION: 1. Continued decrease in size and FDG uptake associated with residual retroperitoneal soft tissue/adenopathy. Findings compatible treated disease, Deauville criteria 2/3. 2. No new sites of disease. Electronically Signed   By: Kerby Moors M.D.   On: 09/28/2019 11:49    Labs:  CBC: Recent Labs    08/10/19 0822 09/07/19 0837 09/30/19 1147 10/13/19 1033  WBC 4.6 4.8 3.6* 6.1  HGB 12.8 12.8 11.8* 13.4  HCT 38.4 38.0 36.2 40.4  PLT 202 180 174 227    COAGS: Recent Labs    04/03/19 0912 10/13/19 1033  INR 0.9 0.9    BMP: Recent Labs    07/13/19 0822 08/10/19 0822 09/07/19 0837 09/30/19 1147  NA 141 140 138 136  K 4.0 3.7 3.8 3.8  CL 109 108 105 107  CO2 27 24 27 26   GLUCOSE 97 97 101* 91  BUN 15 12 19 12   CALCIUM 9.0 9.1 8.9 8.4*  CREATININE 0.60 0.67 0.73 0.75  GFRNONAA >60 >60 >60 >60  GFRAA >60 >60 >60 >60    LIVER FUNCTION TESTS: Recent Labs    07/13/19 0822 08/10/19 0822 09/07/19 0837 09/30/19 1147  BILITOT 0.8 0.8 1.2 1.2  AST 29 31 25 27   ALT 30 31 29  38  ALKPHOS 76 79 76 95  PROT 6.7 6.8 6.9 6.4*  ALBUMIN 4.2 4.2 4.3 4.1    TUMOR MARKERS: No results for input(s): AFPTM, CEA, CA199, CHROMGRNA in the last 8760 hours.  Assessment and Plan:  63 year old with history of follicular lymphoma, grade 3A.  Patient has had a positive response to therapy but there continues to be a small amount of residual retroperitoneal soft tissue.  Request for CT-guided biopsy of the residual retroperitoneal soft tissue to exclude active lymphoma.  Patient is familiar with CT-guided procedures having undergone multiple procedures in the past.  Patient understands that there is minimal retroperitoneal tissue present and the procedure may be technically difficult.  In addition, the patient understands that the biopsy could be false negative due to the limitations of a percutaneous core biopsy. Risks and  benefits of CT-guided retroperitoneal lymph node biopsy was discussed with the patient and/or patient's family including, but not limited to bleeding, infection, damage to adjacent structures or low yield requiring additional  tests. All of the questions were answered and there is agreement to proceed. Consent signed and in chart.   Thank you for this interesting consult.  I greatly enjoyed meeting KARIGAN MAHLKE and look forward to participating in their care.  A copy of this report was sent to the requesting provider on this date.  Electronically Signed: Burman Riis, MD 10/13/2019, 11:09 AM   I spent a total of    10 Minutes in face to face in clinical consultation, greater than 50% of which was counseling/coordinating care for CT-guided biopsy.

## 2019-10-14 ENCOUNTER — Other Ambulatory Visit: Payer: Self-pay | Admitting: Radiology

## 2019-10-15 LAB — SURGICAL PATHOLOGY

## 2019-10-16 ENCOUNTER — Ambulatory Visit
Admission: RE | Admit: 2019-10-16 | Discharge: 2019-10-16 | Disposition: A | Discharge status: Home / Self Care | Payer: BLUE CROSS/BLUE SHIELD | Source: Ambulatory Visit | Attending: Oncology | Admitting: Oncology

## 2019-10-16 ENCOUNTER — Other Ambulatory Visit: Payer: Self-pay

## 2019-10-16 ENCOUNTER — Encounter: Payer: Self-pay | Admitting: *Deleted

## 2019-10-16 ENCOUNTER — Encounter: Payer: Self-pay | Admitting: Oncology

## 2019-10-16 DIAGNOSIS — Z452 Encounter for adjustment and management of vascular access device: Secondary | ICD-10-CM | POA: Diagnosis not present

## 2019-10-16 DIAGNOSIS — Z8572 Personal history of non-Hodgkin lymphomas: Secondary | ICD-10-CM | POA: Diagnosis not present

## 2019-10-16 HISTORY — PX: IR REMOVAL TUN ACCESS W/ PORT W/O FL MOD SED: IMG2290

## 2019-10-16 MED ORDER — SODIUM CHLORIDE 0.9 % IV SOLN: INTRAVENOUS | Status: DC

## 2019-10-16 MED ORDER — CEFAZOLIN SODIUM-DEXTROSE 2-4 GM/100ML-% IV SOLN: 2.0000 g | INTRAVENOUS | Status: DC

## 2019-10-16 NOTE — Procedures (Signed)
Interventional Radiology Procedure:   Indications: History of lymphoma.   Procedure: Port removal  Findings: Complete removal of port  Complications: None     EBL: less than 10 ml  Plan: Discharge to home.  Keep incision dry for 24 hours and do not submerge in water for 1 wee.    Milisa Kimbell R. Anselm Pancoast, MD  Pager: [phone]

## 2019-10-16 NOTE — Progress Notes (Addendum)
Port  Removed today without sedation per Dr. Anselm Pancoast. Post Procedure VS stable. Lower back site from biopsy done earlier this week assessed. Bandaid removed, site CDI. New band aid replaced per Pt request. PT for D/C

## 2020-06-03 IMAGING — XA DG C-ARM 61-120 MIN
2 series · 2 of 2 positions shown · non-contrast
Comparison: CT scan March 28, 2019

CLINICAL DATA: Left ureteral stent placement.

FLUOROSCOPY TIME:  42 seconds.
Images: 2
EXAM:
ABDOMEN - 1 VIEW

[Series 1: cont. · 1 of 1 slices shown (1 of 2)]
[im 1/1]
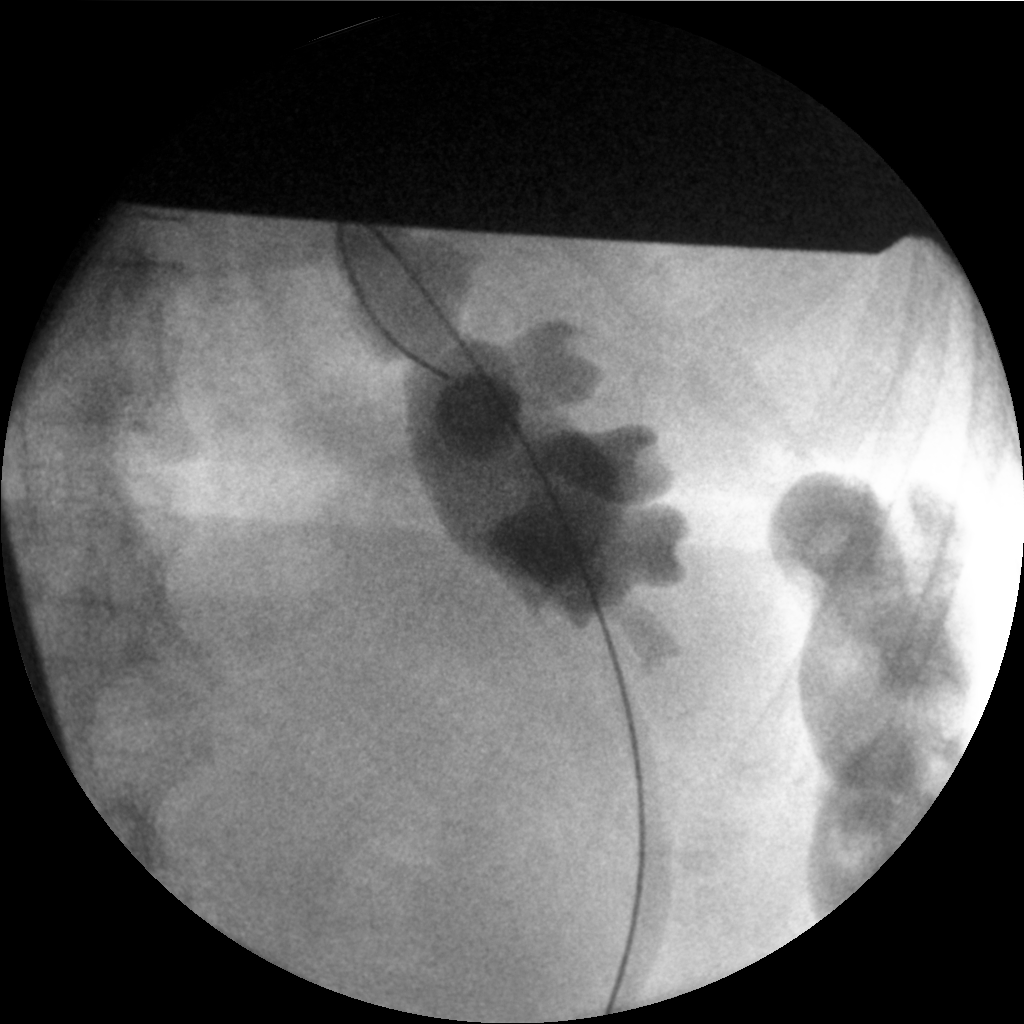

[Series 2: cont. · 1 of 1 slices shown (2 of 2)]
[im 1/1]
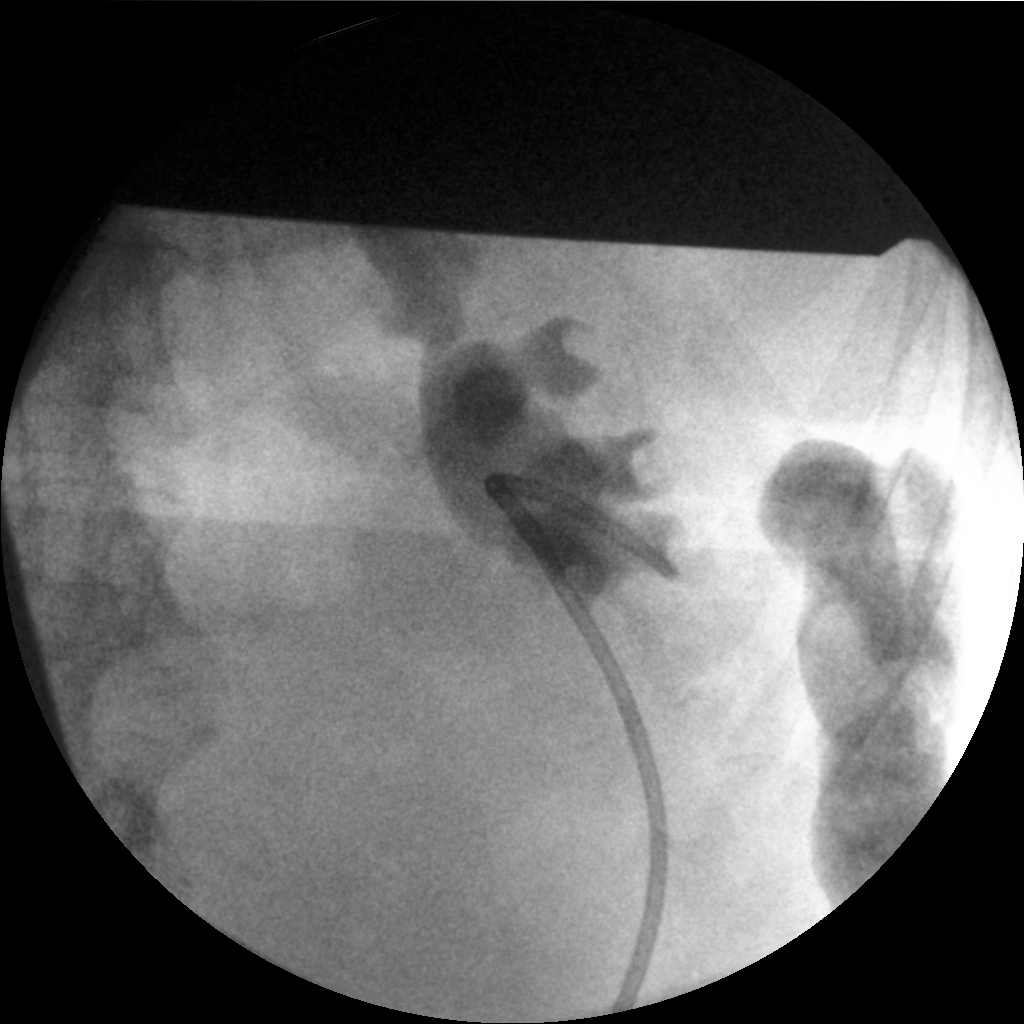

[2 of 2 positions shown; findings below may reference images not displayed]

FINDINGS: By the end of the study, a stent has been placed in the left ureter.
Only the proximal aspect of the stent is identified. The proximal
portion of the stent has not formed a complete pigtail.
Hydronephrosis remains on the final image of the study.
IMPRESSION: Left ureteral stent placement as above.

## 2020-06-09 IMAGING — CT CT BIOPSY
1 of 2 series · 13 of 32 positions shown, 19 images · non-contrast
Comparison: none

CLINICAL DATA: History of grade 2 follicular lymphoma of
retroperitoneal lymph nodes and status post prior lymph node biopsy
on 01/21/2017. The patient now presents with significant progressive
lymph node enlargement and PET scan evidence of progressive disease.
Repeat lymph node biopsy now necessary to restage lymphoma.

[Series 4: i-sequence 4.8 b30s · axial · 0.59mm/px · z∈[+781,+914]mm · 13 of 24 slices shown, 19 images]
[im 2/24  soft-tissue]
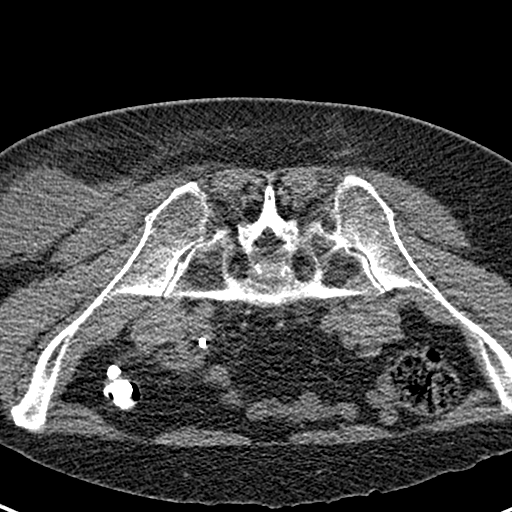
[im 2/24  bone]
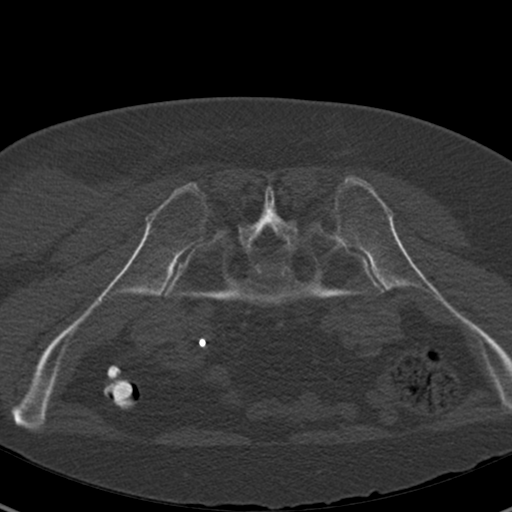
[im 4/24  soft-tissue]
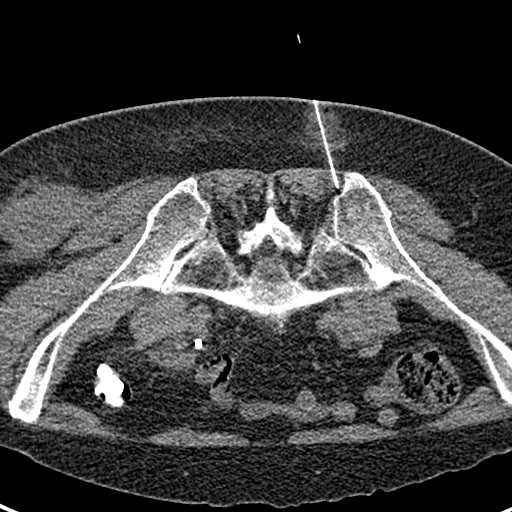
[im 5/24  soft-tissue]
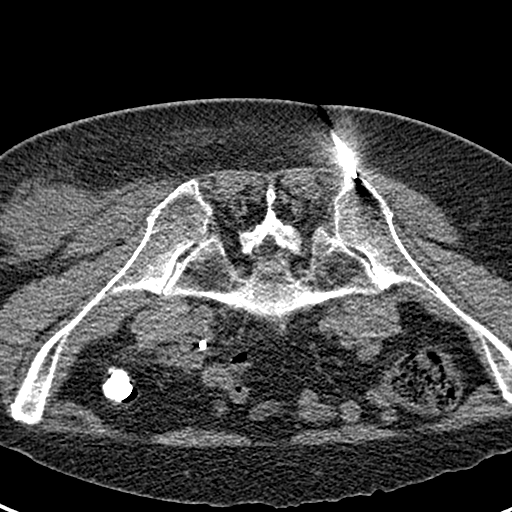
[im 7/24  soft-tissue]
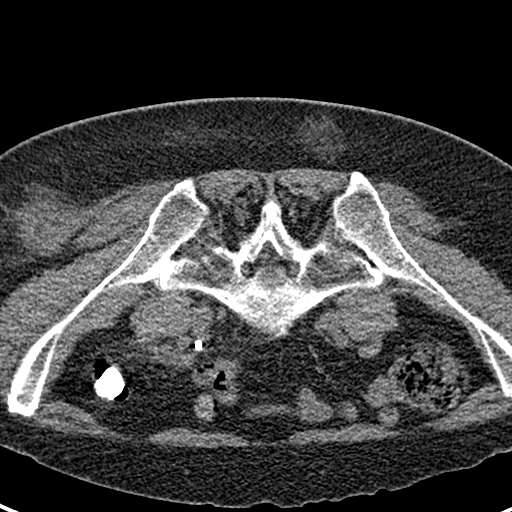
[im 8/24  soft-tissue]
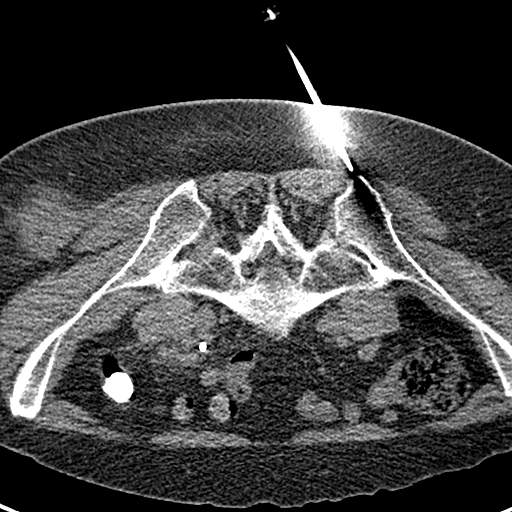
[im 10/24  soft-tissue]
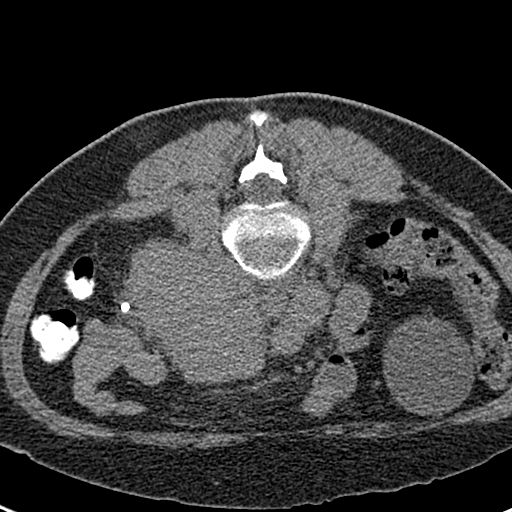
[im 13/24  soft-tissue]
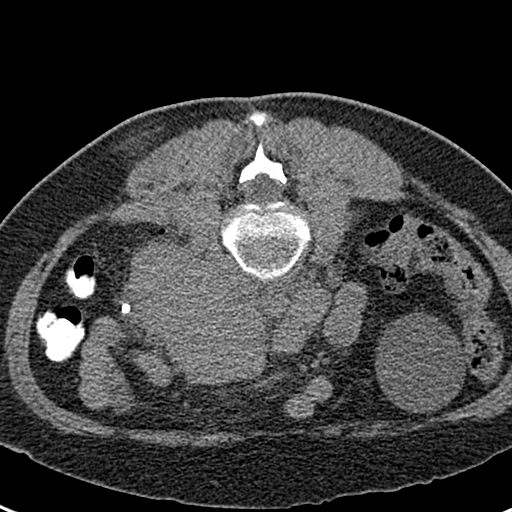
[im 14/24  soft-tissue]
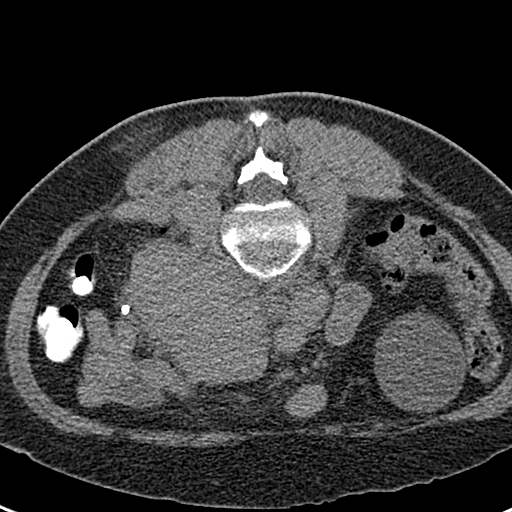
[im 16/24  soft-tissue]
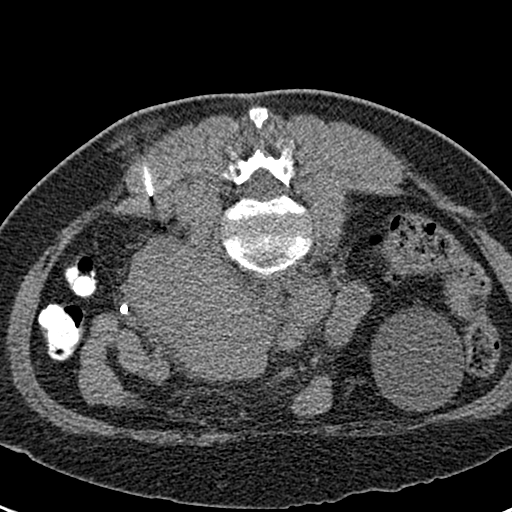
[im 16/24  lung]
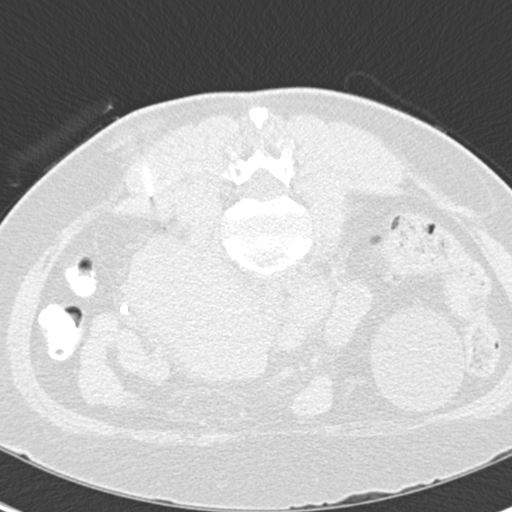
[im 16/24  bone]
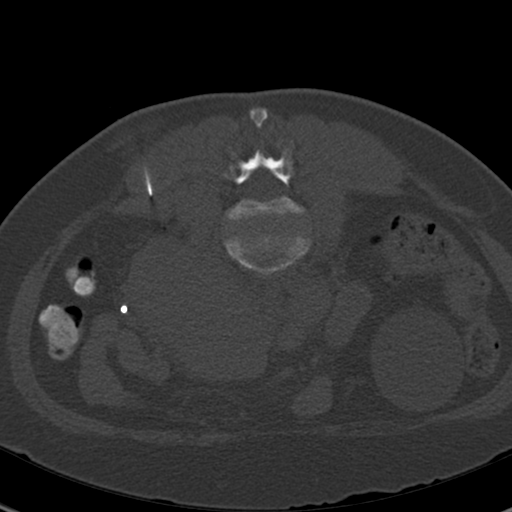
[im 17/24  soft-tissue]
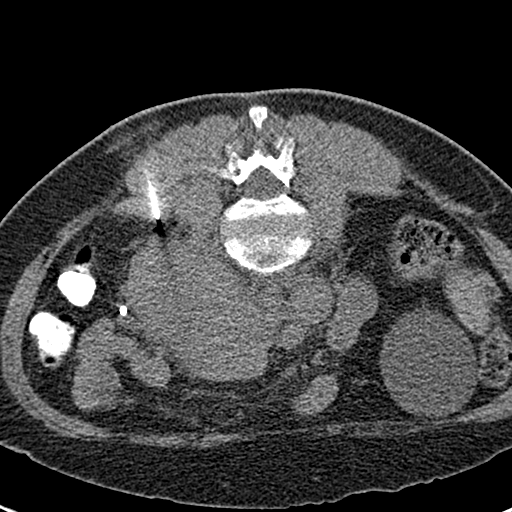
[im 19/24  soft-tissue]
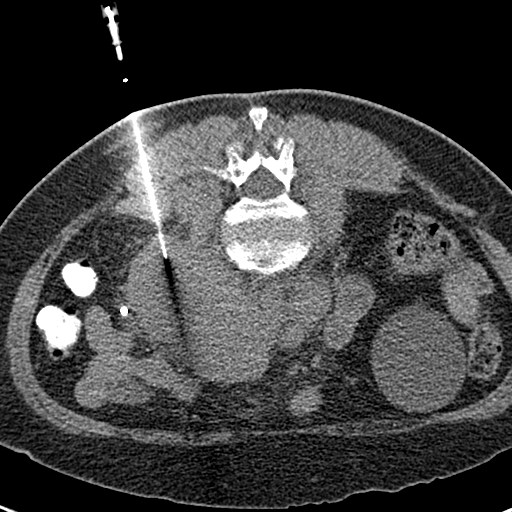
[im 19/24  lung]
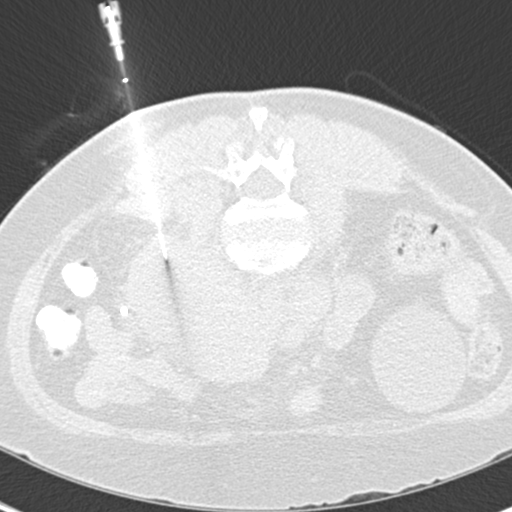
[im 20/24  soft-tissue]
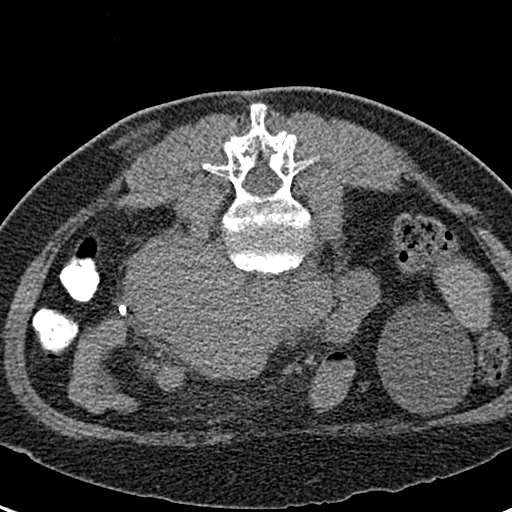
[im 20/24  lung]
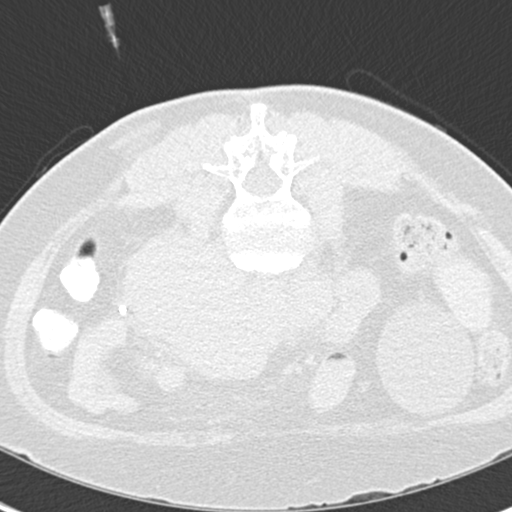
[im 22/24  soft-tissue]
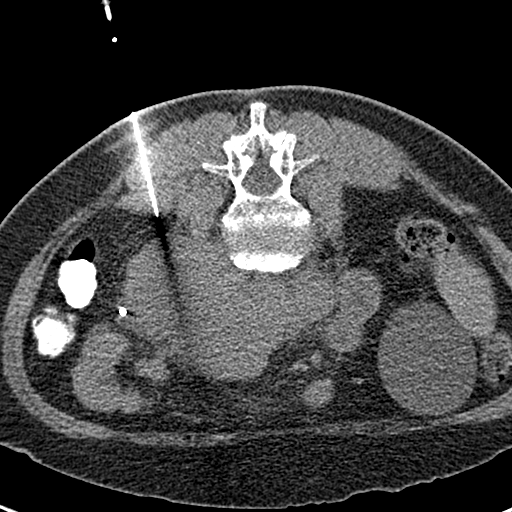
[im 22/24  lung]
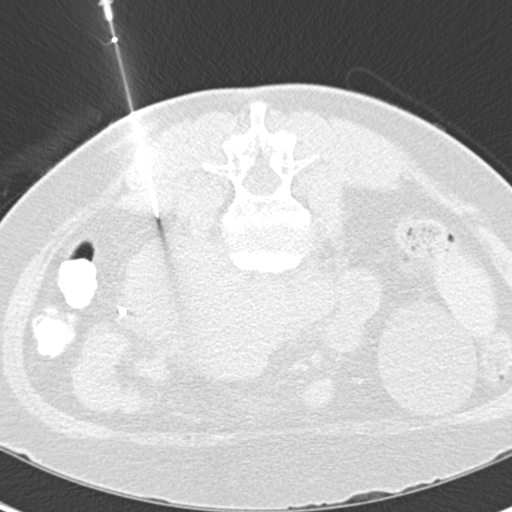

[13 of 32 positions shown; findings below may reference images not displayed]

EXAM:
CT GUIDED CORE BIOPSY OF LEFT PARA-AORTIC RETROPERITONEAL
LYMPHADENOPATHY

ANESTHESIA/SEDATION:
4.0 mg IV Versed; 100 mcg IV Fentanyl

Total Moderate Sedation Time:  45 minutes.

The patient's level of consciousness and physiologic status were
continuously monitored during the procedure by Radiology nursing.

Sedation represents total sedation administered for combined
CT-guided bone marrow and lymph node biopsy procedures.

PROCEDURE:
The procedure risks, benefits, and alternatives were explained to
the patient. Questions regarding the procedure were encouraged and
answered. The patient understands and consents to the procedure. A
time-out was performed prior to initiating the procedure.

CT was performed through the lower abdomen in a prone position. The
left translumbar region was prepped with chlorhexidine in a sterile
fashion, and a sterile drape was applied covering the operative
field. A sterile gown and sterile gloves were used for the
procedure. Local anesthesia was provided with 1% Lidocaine.

Under CT guidance, a 17 gauge needle was advanced from a left trans
lumbar approach to the level of enlarged para-aortic
lymphadenopathy. Coaxial 18 gauge core biopsy samples were obtained.
A total of 6 core biopsy samples were submitted on saline soaked
Telfa gauze.

COMPLICATIONS:
None
FINDINGS: Largest area confluent lymphadenopathy measuring approximately 10 cm
in maximum diameter was targeted in the left para-aortic region
medial to a displaced ureteral stent. Solid tissue was obtained.
IMPRESSION: CT-guided core biopsy performed of large lymph node mass in the left
para-aortic retroperitoneum.

## 2021-08-15 ENCOUNTER — Encounter: Payer: Self-pay | Admitting: Oncology

## 2022-05-21 ENCOUNTER — Other Ambulatory Visit: Payer: Self-pay

## 2023-05-28 ENCOUNTER — Other Ambulatory Visit: Payer: Self-pay | Admitting: *Deleted

## 2023-05-28 ENCOUNTER — Telehealth: Payer: Self-pay | Admitting: Oncology

## 2023-05-28 DIAGNOSIS — C8233 Follicular lymphoma grade IIIa, intra-abdominal lymph nodes: Secondary | ICD-10-CM

## 2023-05-28 NOTE — Telephone Encounter (Signed)
Pt called and wants to re-establish care with Dr.Rao. Was last seen in 2020, and has been going to [facility] due to [facility] not being in network for insurance. Pt stated that [facility] is back in network for her insurance.  Best number for patient is the mobile number ending in [phone]  Please advice thank you

## 2023-05-28 NOTE — Telephone Encounter (Signed)
Cbc with diff, cmp and see me in 1-2 weeks

## 2023-05-28 NOTE — Progress Notes (Signed)
Sent message to  her appt.

## 2023-06-11 ENCOUNTER — Inpatient Hospital Stay: Payer: Self-pay | Admitting: Oncology

## 2023-06-11 ENCOUNTER — Inpatient Hospital Stay: Payer: Self-pay

## 2023-07-12 ENCOUNTER — Inpatient Hospital Stay: Payer: 59 | Admitting: Oncology

## 2023-07-12 ENCOUNTER — Inpatient Hospital Stay: Payer: 59

## 2023-07-19 ENCOUNTER — Inpatient Hospital Stay (HOSPITAL_BASED_OUTPATIENT_CLINIC_OR_DEPARTMENT_OTHER): Payer: 59 | Admitting: Oncology

## 2023-07-19 ENCOUNTER — Encounter: Payer: Self-pay | Admitting: Oncology

## 2023-07-19 ENCOUNTER — Inpatient Hospital Stay: Payer: 59 | Attending: Oncology

## 2023-07-19 VITALS — BP 107/81 | HR 85 | Temp 96.6°F | Resp 18 | Ht 66.0 in | Wt 210.8 lb

## 2023-07-19 DIAGNOSIS — Z9221 Personal history of antineoplastic chemotherapy: Secondary | ICD-10-CM | POA: Insufficient documentation

## 2023-07-19 DIAGNOSIS — Z8572 Personal history of non-Hodgkin lymphomas: Secondary | ICD-10-CM | POA: Diagnosis present

## 2023-07-19 DIAGNOSIS — Z08 Encounter for follow-up examination after completed treatment for malignant neoplasm: Secondary | ICD-10-CM | POA: Diagnosis not present

## 2023-07-19 DIAGNOSIS — C8233 Follicular lymphoma grade IIIa, intra-abdominal lymph nodes: Secondary | ICD-10-CM

## 2023-07-19 LAB — CBC WITH DIFFERENTIAL/PLATELET
Abs Immature Granulocytes: 0.04 10*3/uL (ref 0.00–0.07)
Basophils Absolute: 0.1 10*3/uL (ref 0.0–0.1)
Basophils Relative: 1 %
Eosinophils Absolute: 0.2 10*3/uL (ref 0.0–0.5)
Eosinophils Relative: 2 %
HCT: 41.3 % (ref 36.0–46.0)
Hemoglobin: 13.4 g/dL (ref 12.0–15.0)
Immature Granulocytes: 1 %
Lymphocytes Relative: 21 %
Lymphs Abs: 1.7 10*3/uL (ref 0.7–4.0)
MCH: 28.8 pg (ref 26.0–34.0)
MCHC: 32.4 g/dL (ref 30.0–36.0)
MCV: 88.8 fL (ref 80.0–100.0)
Monocytes Absolute: 0.5 10*3/uL (ref 0.1–1.0)
Monocytes Relative: 6 %
Neutro Abs: 5.7 10*3/uL (ref 1.7–7.7)
Neutrophils Relative %: 69 %
Platelets: 246 10*3/uL (ref 150–400)
RBC: 4.65 MIL/uL (ref 3.87–5.11)
RDW: 13.1 % (ref 11.5–15.5)
WBC: 8.2 10*3/uL (ref 4.0–10.5)
nRBC: 0 % (ref 0.0–0.2)

## 2023-07-19 LAB — COMPREHENSIVE METABOLIC PANEL
ALT: 26 U/L (ref 0–44)
AST: 21 U/L (ref 15–41)
Albumin: 4.2 g/dL (ref 3.5–5.0)
Alkaline Phosphatase: 56 U/L (ref 38–126)
Anion gap: 8 (ref 5–15)
BUN: 20 mg/dL (ref 8–23)
CO2: 26 mmol/L (ref 22–32)
Calcium: 8.9 mg/dL (ref 8.9–10.3)
Chloride: 105 mmol/L (ref 98–111)
Creatinine, Ser: 0.71 mg/dL (ref 0.44–1.00)
GFR, Estimated: >60 mL/min (ref >60)
Glucose, Bld: 131 mg/dL — ABNORMAL HIGH (ref 70–99)
Potassium: 3.6 mmol/L (ref 3.5–5.1)
Sodium: 139 mmol/L (ref 135–145)
Total Bilirubin: 0.8 mg/dL (ref 0.3–1.2)
Total Protein: 6.6 g/dL (ref 6.5–8.1)

## 2023-07-20 NOTE — Progress Notes (Signed)
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    2. This was followed by a CT of the abdomen which showed: Multiple enlarged abdominal and left common iliac nodes. Findings are worrisome for either metastatic adenopathy from a abdominal primary versus lymphoproliferative disorder. Consider further investigation with PET-CT and tissue sampling.   3. PET/CT scan from 01/08/2017 showed:Hypermetabolic upper abdominal and retroperitoneal lymph nodes, measuring up to 1.9 cm short axis, worrisome for nodal metastases versus lymphoma.   Hypermetabolic 7 mm short axis node at the left thoracic inlet, worrisome for lymphatic spread via the thoracic duct.   4. Tumor markers including CEA and CA-19-9 was normal at 1 and 8 respectively.   5. She is otherwise doing well and denies any complaints of unintentional weight loss, loss of appetite, fevers or chills or drenching night sweats. Her abdominal pain is mostly associated with eating and sometimes comes on a day later. It is mainly in her left lower quadrant and is a dull aching pain but sometimes she seems to have pain in the epigastrium and right upper quadrant   6. Patient underwent IR guided retroperitoneal LN biopsy which showed : DIAGNOSIS:  A. LYMPH NODE, LEFT RETROPERITONEAL; CT-GUIDED BIOPSY:  - FOLLICULAR  LYMPHOMA, GRADE 1-2.    7. Bone marrow biopsy negative for lymphoma. Normal karyotype and cytogenetics   8.  Patient underwent excisional lymph node biopsy of the left supraclavicular lymph node. Pathology showed Grdae 3A follicular lymphoma. No diffuse pattern was seen.  Patient was started on bendamustine Rituxan chemotherapy on 04/17/2019.  Interim scans after 3 cyclesshowed excellent response to treatment. She completed 6 cycles of BR in nov 2020 and remains in remission since then      Interval history- she is doing well presently.  Denies any unintentional weight loss or changes in her appetite.  Denies any new aches and pains anywhere.she is working full time.   ECOG PS- 1 Pain scale- 0  Review of systems- Review of Systems  Constitutional:  Negative for chills, fever, malaise/fatigue and weight loss.  HENT:  Negative for congestion, ear discharge and nosebleeds.   Eyes:  Negative for blurred vision.  Respiratory:  Negative for cough, hemoptysis, sputum production, shortness of breath and wheezing.   Cardiovascular:  Negative for chest pain, palpitations, orthopnea and claudication.  Gastrointestinal:  Negative for abdominal pain, blood in stool, constipation, diarrhea, heartburn, melena, nausea and vomiting.  Genitourinary:  Negative for dysuria, flank pain, frequency, hematuria and urgency.  Musculoskeletal:  Negative for back pain, joint pain and myalgias.  Skin:  Negative for rash.  Neurological:  Negative for dizziness, tingling, focal weakness, seizures, weakness and headaches.  Endo/Heme/Allergies:  Does not bruise/bleed easily.  Psychiatric/Behavioral:  Negative for depression and suicidal ideas. The patient does not have insomnia.       Allergies  Allergen Reactions   Oxycodone Hives   Keflex [Cephalexin] Hives  Adhesive [Tape] Rash    Bandaids     Past Medical History:  Diagnosis Date   Asthma    Family history of adverse reaction to anesthesia    Father -  slow to wake   Gravida 4 para 4    Lymphoma (HCC)      Past Surgical History:  Procedure Laterality Date   ABDOMINAL HYSTERECTOMY     06 01 2013   BLADDER SURGERY     COLONOSCOPY WITH PROPOFOL N/A 02/25/2017   Procedure: COLONOSCOPY WITH PROPOFOL;  Surgeon: Midge Minium, MD;  Location: [facility] [place] [facility];  Service: Endoscopy;  Laterality: N/A;   CYSTOSCOPY W/ URETERAL STENT PLACEMENT Left 03/28/2019   Procedure: CYSTOSCOPY WITH RETROGRADE PYELOGRAM/URETERAL STENT PLACEMENT;  Surgeon: Riki Altes, MD;  Location: [facility];  Service: Urology;  Laterality: Left;   CYSTOSCOPY WITH STENT PLACEMENT Left 04/23/2019   Procedure: CYSTOSCOPY WITH LEFT STENT EXCHANGE;  Surgeon: Riki Altes, MD;  Location: [facility];  Service: Urology;  Laterality: Left;   IR IMAGING GUIDED PORT INSERTION  04/06/2019   IR REMOVAL TUN ACCESS W/ PORT W/O FL MOD SED  10/16/2019   MASS BIOPSY Left 04/10/2019   Procedure: NECK MASS BIOPSY LEFT;  Surgeon: Leafy Ro, MD;  Location: [facility];  Service: General;  Laterality: Left;   POLYPECTOMY  02/25/2017   Procedure: POLYPECTOMY;  Surgeon: Midge Minium, MD;  Location: [facility];  Service: Endoscopy;;   PORT A CATH INJECTION ([facility] HX)     ROTATOR CUFF REPAIR Right    TONSILLECTOMY  1972    Social History   Socioeconomic History   Marital status: Married    Spouse name: Not on file   Number of children: Not on file   Years of education: Not on file   Highest education level: Not on file  Occupational History   Not on file  Tobacco Use   Smoking status: Never   Smokeless tobacco: Never  Vaping Use   Vaping status: Never Used  Substance and Sexual Activity   Alcohol use: No   Drug use: No   Sexual activity: Yes  Other Topics Concern   Not on file  Social History Narrative   Not on file   Social Determinants of Health   Financial Resource Strain: Not on file  Food Insecurity: Not on file  Transportation Needs: Not on file  Physical  Activity: Not on file  Stress: Not on file  Social Connections: Not on file  Intimate Partner Violence: Not on file    Family History  Problem Relation Age of Onset   Alcohol abuse Maternal Grandfather    Healthy Mother    Cancer Father    Healthy Sister    Healthy Brother    Breast cancer Maternal Aunt    Hyperlipidemia Neg Hx    Heart disease Neg Hx    Diabetes Neg Hx      Current Outpatient Medications:    albuterol (VENTOLIN HFA) 108 (90 Base) MCG/ACT inhaler, Inhale 2 puffs into the lungs every 6 (six) hours as needed., Disp: , Rfl:    Cholecalciferol (VITAMIN D3 PO), Take 20,000 mcg by mouth daily., Disp: , Rfl:    Menaquinone-7 (VITAMIN K2 PO), Take 200 mcg by mouth daily., Disp: , Rfl:    acyclovir (ZOVIRAX) 400 MG tablet, Take 1 tablet (400 mg total) by mouth daily. (Patient not taking: Reported on 10/13/2019), Disp: 30 tablet, Rfl: 3   HYDROcodone-acetaminophen (NORCO) 10-325 MG tablet, Take  1 tablet by mouth every 6 (six) hours as needed. (Patient not taking: Reported on 09/30/2019), Disp: 90 tablet, Rfl: 0   lidocaine-prilocaine (EMLA) cream, Apply to affected area once (Patient not taking: Reported on 10/16/2019), Disp: 30 g, Rfl: 3   Multiple Vitamin (MULTI-VITAMIN DAILY PO), Take 1 tablet by mouth daily.  (Patient not taking: Reported on 07/19/2023), Disp: , Rfl:    Probiotic Product (PROBIOTIC PO), Take 1 capsule by mouth daily. (Patient not taking: Reported on 07/19/2023), Disp: , Rfl:    prochlorperazine (COMPAZINE) 10 MG tablet, Take 1 tablet (10 mg total) by mouth every 6 (six) hours as needed (Nausea or vomiting). (Patient not taking: Reported on 09/30/2019), Disp: 30 tablet, Rfl: 1  Physical exam:  Vitals:   07/19/23 1334  BP: 107/81  Pulse: 85  Resp: 18  Temp: (!) 96.6 F (35.9 C)  TempSrc: Tympanic  SpO2: 97%  Weight: 210 lb 12.8 oz (95.6 kg)  Height: 5\' 6"  (1.676 m)   Physical Exam Cardiovascular:     Rate and Rhythm: Normal rate and regular rhythm.      Heart sounds: Normal heart sounds.  Pulmonary:     Effort: Pulmonary effort is normal.     Breath sounds: Normal breath sounds.  Abdominal:     General: Bowel sounds are normal.     Palpations: Abdomen is soft.     Comments: No palpable hepato-splenomegaly  Lymphadenopathy:     Comments: No palpable cervical, supraclavicular, axillary or inguinal adenopathy    Skin:    Comments: Superficial subcutaneous firm swelling most likely lipoma  Neurological:     Mental Status: She is alert and oriented to person, place, and time.         Latest Ref Rng & Units 07/19/2023   12:55 PM  CMP  Glucose 70 - 99 mg/dL 841   BUN 8 - 23 mg/dL 20   Creatinine 3.24 - 1.00 mg/dL 4.01   Sodium 027 - 253 mmol/L 139   Potassium 3.5 - 5.1 mmol/L 3.6   Chloride 98 - 111 mmol/L 105   CO2 22 - 32 mmol/L 26   Calcium 8.9 - 10.3 mg/dL 8.9   Total Protein 6.5 - 8.1 g/dL 6.6   Total Bilirubin 0.3 - 1.2 mg/dL 0.8   Alkaline Phos 38 - 126 U/L 56   AST 15 - 41 U/L 21   ALT 0 - 44 U/L 26       Latest Ref Rng & Units 07/19/2023   12:55 PM  CBC  WBC 4.0 - 10.5 K/uL 8.2   Hemoglobin 12.0 - 15.0 g/dL 66.4   Hematocrit 40.3 - 46.0 % 41.3   Platelets 150 - 400 K/uL 246     Assessment and plan- Patient is a 67 y.o. female with a history of stage III grade 3A follicular lymphoma s/p 6 cycles of bendamustine and Rituxan. She is currently in remission and this is a routine f/u visit  Patient was last seen by me in 2020 and subsequently moved her care to [facility] due to insurance reasons.  She was seen by them 2 years ago.  She has completed 6 cycles of BR chemotherapy for follicular lymphoma in November 2020 and remains in remission since then.  Clinically she is doing well with no concerning signs and symptoms of recurrence based on today's exam.  She has a superficial right upper quadrant firm swelling which appears to be consistent with a lipoma.  If there is a continued increase  in the size of the swelling we  will consider getting CT scans.  I will continue to follow her every 6 months for physical exams to complete 5 years of surveillance followed by yearly exams   Visit Diagnosis 1. Encounter for follow-up surveillance of lymphoma      Dr. Owens Shark, MD, MPH [facility] at [facility] [phone] 07/20/2023 6:07 PM

## 2024-01-17 ENCOUNTER — Inpatient Hospital Stay (HOSPITAL_BASED_OUTPATIENT_CLINIC_OR_DEPARTMENT_OTHER): Payer: 59 | Admitting: Nurse Practitioner

## 2024-01-17 ENCOUNTER — Encounter: Payer: Self-pay | Admitting: Nurse Practitioner

## 2024-01-17 ENCOUNTER — Inpatient Hospital Stay: Payer: 59 | Attending: Nurse Practitioner

## 2024-01-17 VITALS — BP 113/84 | HR 90 | Temp 97.6°F | Resp 20 | Wt 215.9 lb

## 2024-01-17 DIAGNOSIS — Z08 Encounter for follow-up examination after completed treatment for malignant neoplasm: Secondary | ICD-10-CM

## 2024-01-17 DIAGNOSIS — Z8572 Personal history of non-Hodgkin lymphomas: Secondary | ICD-10-CM | POA: Insufficient documentation

## 2024-01-17 DIAGNOSIS — Z90710 Acquired absence of both cervix and uterus: Secondary | ICD-10-CM | POA: Diagnosis not present

## 2024-01-17 DIAGNOSIS — Z79624 Long term (current) use of inhibitors of nucleotide synthesis: Secondary | ICD-10-CM | POA: Insufficient documentation

## 2024-01-17 LAB — LACTATE DEHYDROGENASE: LDH: 116 U/L (ref 98–192)

## 2024-01-17 LAB — COMPREHENSIVE METABOLIC PANEL
ALT: 25 U/L (ref 0–44)
AST: 23 U/L (ref 15–41)
Albumin: 4.3 g/dL (ref 3.5–5.0)
Alkaline Phosphatase: 57 U/L (ref 38–126)
Anion gap: 12 (ref 5–15)
BUN: 19 mg/dL (ref 8–23)
CO2: 25 mmol/L (ref 22–32)
Calcium: 9.2 mg/dL (ref 8.9–10.3)
Chloride: 102 mmol/L (ref 98–111)
Creatinine, Ser: 0.76 mg/dL (ref 0.44–1.00)
GFR, Estimated: >60 mL/min (ref >60)
Glucose, Bld: 118 mg/dL — ABNORMAL HIGH (ref 70–99)
Potassium: 3.5 mmol/L (ref 3.5–5.1)
Sodium: 139 mmol/L (ref 135–145)
Total Bilirubin: 1.1 mg/dL (ref 0.0–1.2)
Total Protein: 6.7 g/dL (ref 6.5–8.1)

## 2024-01-17 LAB — CBC WITH DIFFERENTIAL/PLATELET
Abs Immature Granulocytes: 0.03 10*3/uL (ref 0.00–0.07)
Basophils Absolute: 0.1 10*3/uL (ref 0.0–0.1)
Basophils Relative: 1 %
Eosinophils Absolute: 0.1 10*3/uL (ref 0.0–0.5)
Eosinophils Relative: 2 %
HCT: 42.3 % (ref 36.0–46.0)
Hemoglobin: 13.8 g/dL (ref 12.0–15.0)
Immature Granulocytes: 0 %
Lymphocytes Relative: 21 %
Lymphs Abs: 1.6 10*3/uL (ref 0.7–4.0)
MCH: 29.1 pg (ref 26.0–34.0)
MCHC: 32.6 g/dL (ref 30.0–36.0)
MCV: 89.1 fL (ref 80.0–100.0)
Monocytes Absolute: 0.4 10*3/uL (ref 0.1–1.0)
Monocytes Relative: 6 %
Neutro Abs: 5.3 10*3/uL (ref 1.7–7.7)
Neutrophils Relative %: 70 %
Platelets: 244 10*3/uL (ref 150–400)
RBC: 4.75 MIL/uL (ref 3.87–5.11)
RDW: 13.2 % (ref 11.5–15.5)
WBC: 7.5 10*3/uL (ref 4.0–10.5)
nRBC: 0 % (ref 0.0–0.2)

## 2024-01-17 NOTE — Progress Notes (Signed)
 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    2. This was followed by a CT of the abdomen which showed: Multiple enlarged abdominal and left common iliac nodes. Findings are worrisome for either metastatic adenopathy from a abdominal primary versus lymphoproliferative disorder. Consider further investigation with PET-CT and tissue sampling.   3. PET/CT scan from 01/08/2017 showed: Hypermetabolic upper abdominal and retroperitoneal lymph nodes, measuring up to 1.9 cm short axis, worrisome for nodal metastases versus lymphoma.   Hypermetabolic 7 mm short axis node at the left thoracic inlet, worrisome for lymphatic spread via the thoracic duct.   4. Tumor markers including CEA and CA-19-9 was normal at 1 and 8 respectively.   5. She is otherwise doing well and denies any complaints of unintentional weight loss, loss of appetite, fevers or chills or drenching night sweats. Her abdominal pain is mostly associated with eating and sometimes comes on a day later. It is mainly in her left lower quadrant and is a dull aching pain but sometimes she seems to have pain in the epigastrium and right upper quadrant   6. Patient underwent IR guided retroperitoneal LN biopsy which showed : DIAGNOSIS:  A. LYMPH NODE, LEFT RETROPERITONEAL; CT-GUIDED BIOPSY:  - FOLLICULAR LYMPHOMA,  GRADE 1-2.    7. Bone marrow biopsy negative for lymphoma. Normal karyotype and cytogenetics   8.  Patient underwent excisional lymph node biopsy of the left supraclavicular lymph node. Pathology showed Grade 3A follicular lymphoma. No diffuse pattern was seen.  Patient was started on bendamustine Rituxan chemotherapy on 04/17/2019. Interim scans after 3 cycles showed excellent response to treatment. She completed 6 cycles of BR in nov 2020 and remains in remission since then      Interval history- Patient is 68 year old female with history of follicular lymphoma, on surveillance, who returns to clinic for follow up. She continues to feel well. Denies unintentional weight loss, no fevers, chills, or night sweats. Denies unintentional weight loss. No new aches or pain. No new lumps or bumps. No abdominal pain or early satiety.   ECOG PS- 0 Pain scale- 0  Review of systems- Review of Systems  Constitutional:  Negative for chills, fever, malaise/fatigue and weight loss.  HENT:  Negative for congestion, ear discharge and nosebleeds.   Eyes:  Negative for blurred vision.  Respiratory:  Negative for cough, hemoptysis, sputum production, shortness of breath and wheezing.   Cardiovascular:  Negative for chest pain, palpitations, orthopnea and claudication.  Gastrointestinal:  Negative for abdominal pain, blood in stool, constipation, diarrhea, heartburn, melena, nausea and vomiting.  Genitourinary:  Negative for dysuria, flank pain, frequency, hematuria and urgency.  Musculoskeletal:  Negative for back pain, joint pain and myalgias.  Skin:  Negative for rash.  Neurological:  Negative for dizziness, tingling, focal weakness, seizures, weakness and headaches.  Endo/Heme/Allergies:  Does not bruise/bleed easily.  Psychiatric/Behavioral:  Negative for depression and suicidal ideas. The  patient does not have insomnia.     Allergies  Allergen Reactions   Oxycodone Hives   Keflex [Cephalexin] Hives    Adhesive [Tape] Rash    Bandaids   Past Medical History:  Diagnosis Date   Asthma    Family history of adverse reaction to anesthesia    Father - slow to wake   Gravida 4 para 4    Lymphoma (HCC)    Past Surgical History:  Procedure Laterality Date   ABDOMINAL HYSTERECTOMY     06 01 2013   BLADDER SURGERY     COLONOSCOPY WITH PROPOFOL N/A 02/25/2017   Procedure: COLONOSCOPY WITH PROPOFOL;  Surgeon: Midge Minium, MD;  Location: [facility];  Service: Endoscopy;  Laterality: N/A;   CYSTOSCOPY W/ URETERAL STENT PLACEMENT Left 03/28/2019   Procedure: CYSTOSCOPY WITH RETROGRADE PYELOGRAM/URETERAL STENT PLACEMENT;  Surgeon: Riki Altes, MD;  Location: [facility];  Service: Urology;  Laterality: Left;   CYSTOSCOPY WITH STENT PLACEMENT Left 04/23/2019   Procedure: CYSTOSCOPY WITH LEFT STENT EXCHANGE;  Surgeon: Riki Altes, MD;  Location: [facility];  Service: Urology;  Laterality: Left;   IR IMAGING GUIDED PORT INSERTION  04/06/2019   IR REMOVAL TUN ACCESS W/ PORT W/O FL MOD SED  10/16/2019   MASS BIOPSY Left 04/10/2019   Procedure: NECK MASS BIOPSY LEFT;  Surgeon: Leafy Ro, MD;  Location: [facility] ORS;  Service: General;  Laterality: Left;   POLYPECTOMY  02/25/2017   Procedure: POLYPECTOMY;  Surgeon: Midge Minium, MD;  Location: [facility];  Service: Endoscopy;;   PORT A CATH INJECTION ([facility] HX)     ROTATOR CUFF REPAIR Right    TONSILLECTOMY  1972   Social History   Socioeconomic History   Marital status: Married    Spouse name: Not on file   Number of children: Not on file   Years of education: Not on file   Highest education level: Not on file  Occupational History   Not on file  Tobacco Use   Smoking status: Never   Smokeless tobacco: Never  Vaping Use   Vaping status: Never Used  Substance and Sexual Activity   Alcohol use: No   Drug use: No   Sexual activity: Yes  Other Topics Concern   Not on file  Social History Narrative   Not on file   Social  Drivers of Health   Financial Resource Strain: Not on file  Food Insecurity: Not on file  Transportation Needs: Not on file  Physical Activity: Not on file  Stress: Not on file  Social Connections: Not on file  Intimate Partner Violence: Not on file   Family History  Problem Relation Age of Onset   Alcohol abuse Maternal Grandfather    Healthy Mother    Cancer Father    Healthy Sister    Healthy Brother    Breast cancer Maternal Aunt    Hyperlipidemia Neg Hx    Heart disease Neg Hx    Diabetes Neg Hx     Current Outpatient Medications:    acyclovir (ZOVIRAX) 400 MG tablet, Take 1 tablet (400 mg total) by mouth daily. (Patient not taking: Reported on 10/13/2019), Disp: 30 tablet, Rfl: 3   albuterol (VENTOLIN HFA) 108 (90 Base) MCG/ACT inhaler, Inhale 2 puffs into the lungs every 6 (six) hours as needed., Disp: , Rfl:    Cholecalciferol (VITAMIN D3 PO), Take 20,000 mcg by mouth daily., Disp: , Rfl:    HYDROcodone-acetaminophen (NORCO) 10-325 MG tablet,  Take 1 tablet by mouth every 6 (six) hours as needed. (Patient not taking: Reported on 09/30/2019), Disp: 90 tablet, Rfl: 0   lidocaine-prilocaine (EMLA) cream, Apply to affected area once (Patient not taking: Reported on 10/16/2019), Disp: 30 g, Rfl: 3   Menaquinone-7 (VITAMIN K2 PO), Take 200 mcg by mouth daily., Disp: , Rfl:    Multiple Vitamin (MULTI-VITAMIN DAILY PO), Take 1 tablet by mouth daily.  (Patient not taking: Reported on 07/19/2023), Disp: , Rfl:    Probiotic Product (PROBIOTIC PO), Take 1 capsule by mouth daily. (Patient not taking: Reported on 07/19/2023), Disp: , Rfl:    prochlorperazine (COMPAZINE) 10 MG tablet, Take 1 tablet (10 mg total) by mouth every 6 (six) hours as needed (Nausea or vomiting). (Patient not taking: Reported on 09/30/2019), Disp: 30 tablet, Rfl: 1  Physical exam:  Vitals:   01/17/24 1316  BP: 113/84  Pulse: 90  Resp: 20  Temp: 97.6 F (36.4 C)  SpO2: 100%  Weight: 215 lb 14.4 oz (97.9 kg)    Physical Exam Vitals reviewed.  Constitutional:      Appearance: She is not ill-appearing.     Comments: Accompanied by husband.  Abdominal:     General: There is no distension.  Skin:    Coloration: Skin is not pale.  Neurological:     Mental Status: She is alert and oriented to person, place, and time.  Psychiatric:        Mood and Affect: Mood normal.        Behavior: Behavior normal.        Latest Ref Rng & Units 01/17/2024   12:41 PM  CMP  Glucose 70 - 99 mg/dL 161   BUN 8 - 23 mg/dL 19   Creatinine 0.96 - 1.00 mg/dL 0.45   Sodium 409 - 811 mmol/L 139   Potassium 3.5 - 5.1 mmol/L 3.5   Chloride 98 - 111 mmol/L 102   CO2 22 - 32 mmol/L 25   Calcium 8.9 - 10.3 mg/dL 9.2   Total Protein 6.5 - 8.1 g/dL 6.7   Total Bilirubin 0.0 - 1.2 mg/dL 1.1   Alkaline Phos 38 - 126 U/L 57   AST 15 - 41 U/L 23   ALT 0 - 44 U/L 25       Latest Ref Rng & Units 01/17/2024   12:41 PM  CBC  WBC 4.0 - 10.5 K/uL 7.5   Hemoglobin 12.0 - 15.0 g/dL 91.4   Hematocrit 78.2 - 46.0 % 42.3   Platelets 150 - 400 K/uL 244    Component Ref Range & Units (hover) 12:41 (01/17/24) 4 yr ago (09/30/19) 4 yr ago (08/10/19) 4 yr ago (06/01/19) 4 yr ago (05/18/19) 4 yr ago (04/28/19) 4 yr ago (04/23/19)  LDH 116 119 CM 116 CM 139 CM 125 CM 129 CM 224 High  CM  Comment: Performed at [facility], [address] [place], [place] [address]    Assessment and plan- Patient is a 68 y.o. female with a   History of stage III grade 3A follicular lymphoma - s/p 6 cycles of bendamustine and Rituxan. Currently in remission. Previously followed by Dr Smith Robert until 2020 then followed by [facility], now re-established with Dr. Smith Robert. She is now 4.5 years from completing treatment and has been NED since. Clinically doing well without signs or symptoms of recurrence. After her next visit we can consider transitioning to annual visits. We reviewed surveillance including labs with monitoring LDH levels,  new B  symptoms (fever, night sweats, unintentional weight loss), infections, new cytopenias, or masses that would be concerning and warrant sooner evaluation or imaging.  Cancer Screenings & Wellness- patient declines future mammograms d/t discomfort with positioning. Reports colonoscopy up to date. S/p hysterectomy. Recommended TBSE w/ dermatology. Previously followed by Dr Maryjane Hurter. Recommended she follow up to reestablish care.   Disposition:  6 mo- lab (cbc, cmp, ldh), Dr Smith Robert- [place]   Visit Diagnosis 1. Encounter for follow-up surveillance of lymphoma    Consuello Masse, DNP, AGNP-C, [facility] at [facility] [phone] (clinic) 01/17/2024

## 2024-07-23 ENCOUNTER — Other Ambulatory Visit: Payer: Self-pay

## 2024-07-23 DIAGNOSIS — Z8572 Personal history of non-Hodgkin lymphomas: Secondary | ICD-10-CM

## 2024-07-24 ENCOUNTER — Encounter: Payer: Self-pay | Admitting: Oncology

## 2024-07-24 ENCOUNTER — Inpatient Hospital Stay: Attending: Oncology

## 2024-07-24 ENCOUNTER — Inpatient Hospital Stay: Admitting: Oncology

## 2024-07-24 VITALS — BP 110/76 | HR 81 | Temp 97.8°F | Resp 19 | Ht 66.0 in | Wt 206.7 lb

## 2024-07-24 DIAGNOSIS — Z08 Encounter for follow-up examination after completed treatment for malignant neoplasm: Secondary | ICD-10-CM | POA: Diagnosis not present

## 2024-07-24 DIAGNOSIS — Z9221 Personal history of antineoplastic chemotherapy: Secondary | ICD-10-CM | POA: Insufficient documentation

## 2024-07-24 DIAGNOSIS — Z8572 Personal history of non-Hodgkin lymphomas: Secondary | ICD-10-CM

## 2024-07-24 DIAGNOSIS — Z803 Family history of malignant neoplasm of breast: Secondary | ICD-10-CM | POA: Insufficient documentation

## 2024-07-24 DIAGNOSIS — C823A Follicular lymphoma grade IIIA, in remission: Secondary | ICD-10-CM | POA: Insufficient documentation

## 2024-07-24 LAB — CBC (CANCER CENTER ONLY)
HCT: 38.2 % (ref 36.0–46.0)
Hemoglobin: 12.6 g/dL (ref 12.0–15.0)
MCH: 28.8 pg (ref 26.0–34.0)
MCHC: 33 g/dL (ref 30.0–36.0)
MCV: 87.2 fL (ref 80.0–100.0)
Platelet Count: 232 K/uL (ref 150–400)
RBC: 4.38 MIL/uL (ref 3.87–5.11)
RDW: 12.7 % (ref 11.5–15.5)
WBC Count: 7.6 K/uL (ref 4.0–10.5)
nRBC: 0 % (ref 0.0–0.2)

## 2024-07-24 LAB — CMP (CANCER CENTER ONLY)
ALT: 18 U/L (ref 0–44)
AST: 20 U/L (ref 15–41)
Albumin: 4 g/dL (ref 3.5–5.0)
Alkaline Phosphatase: 81 U/L (ref 38–126)
Anion gap: 7 (ref 5–15)
BUN: 21 mg/dL (ref 8–23)
CO2: 29 mmol/L (ref 22–32)
Calcium: 8.9 mg/dL (ref 8.9–10.3)
Chloride: 104 mmol/L (ref 98–111)
Creatinine: 0.86 mg/dL (ref 0.44–1.00)
GFR, Estimated: >60 mL/min (ref >60)
Glucose, Bld: 98 mg/dL (ref 70–99)
Potassium: 3.6 mmol/L (ref 3.5–5.1)
Sodium: 140 mmol/L (ref 135–145)
Total Bilirubin: 0.9 mg/dL (ref 0.0–1.2)
Total Protein: 6 g/dL — ABNORMAL LOW (ref 6.5–8.1)

## 2024-07-24 LAB — LACTATE DEHYDROGENASE: LDH: 122 U/L (ref 98–192)

## 2024-07-24 NOTE — Progress Notes (Signed)
 Hematology/Oncology Consult note [facility]  [phone] [phone]  Patient Care Team: Patient, [name] as PCP - General (General Practice)   Name of the patient: Andrea Lloyd  [id number]  12-28-1955   Date of visit: 07/24/24  Diagnosis- stage III grade 3A follicular lymphoma in remission  Chief complaint/ Reason for visit- routine f/u of follicular lymphoma  Heme/Onc history: patient is a 68 year old female who was seen by [facility] GI for evaluation of left lower quadrant abdominal pain which has been ongoing for the last few months She has a history of hysterectomy/sacral colpopexy/ vaginal mesh in 2014.    2. This was followed by a CT of the abdomen which showed: Multiple enlarged abdominal and left common iliac nodes. Findings are worrisome for either metastatic adenopathy from a abdominal primary versus lymphoproliferative disorder. Consider further investigation with PET-CT and tissue sampling.   3. PET/CT scan from 01/08/2017 showed:Hypermetabolic upper abdominal and retroperitoneal lymph nodes, measuring up to 1.9 cm short axis, worrisome for nodal metastases versus lymphoma.   Hypermetabolic 7 mm short axis node at the left thoracic inlet, worrisome for lymphatic spread via the thoracic duct.   4. Tumor markers including CEA and CA-19-9 was normal at 1 and 8 respectively.   5. She is otherwise doing well and denies any complaints of unintentional weight loss, loss of appetite, fevers or chills or drenching night sweats. Her abdominal pain is mostly associated with eating and sometimes comes on a day later. It is mainly in her left lower quadrant and is a dull aching pain but sometimes she seems to have pain in the epigastrium and right upper quadrant   6. Patient underwent IR guided retroperitoneal LN biopsy which showed : DIAGNOSIS:  A. LYMPH NODE, LEFT RETROPERITONEAL; CT-GUIDED BIOPSY:  - FOLLICULAR  LYMPHOMA, GRADE 1-2.    7. Bone marrow biopsy negative for lymphoma. Normal karyotype and cytogenetics   8.  Patient underwent excisional lymph node biopsy of the left supraclavicular lymph node. Pathology showed Grdae 3A follicular lymphoma. No diffuse pattern was seen.  Patient was started on bendamustine  Rituxan  chemotherapy on 04/17/2019.  Interim scans after 3 cyclesshowed excellent response to treatment. She completed 6 cycles of BR in nov 2020 and remains in remission since then     Interval history-overall she feels well.  Her appetite is good and she has not had any unintentional weight loss.  She has some mild chronic back pain but reports no new complaints today  ECOG PS- 1 Pain scale- 0   Review of systems- Review of Systems  Constitutional:  Negative for chills, fever, malaise/fatigue and weight loss.  HENT:  Negative for congestion, ear discharge and nosebleeds.   Eyes:  Negative for blurred vision.  Respiratory:  Negative for cough, hemoptysis, sputum production, shortness of breath and wheezing.   Cardiovascular:  Negative for chest pain, palpitations, orthopnea and claudication.  Gastrointestinal:  Negative for abdominal pain, blood in stool, constipation, diarrhea, heartburn, melena, nausea and vomiting.  Genitourinary:  Negative for dysuria, flank pain, frequency, hematuria and urgency.  Musculoskeletal:  Negative for back pain, joint pain and myalgias.  Skin:  Negative for rash.  Neurological:  Negative for dizziness, tingling, focal weakness, seizures, weakness and headaches.  Endo/Heme/Allergies:  Does not bruise/bleed easily.  Psychiatric/Behavioral:  Negative for depression and suicidal ideas. The patient does not have insomnia.       Allergies  Allergen Reactions   Oxycodone  Hives   Keflex [Cephalexin]  Hives   Adhesive [Tape] Rash    Bandaids     Past Medical History:  Diagnosis Date   Asthma    Family history of adverse reaction to anesthesia     Father - slow to wake   Gravida 4 para 4    Lymphoma (HCC)      Past Surgical History:  Procedure Laterality Date   ABDOMINAL HYSTERECTOMY     06 01 2013   BLADDER SURGERY     COLONOSCOPY WITH PROPOFOL  N/A 02/25/2017   Procedure: COLONOSCOPY WITH PROPOFOL ;  Surgeon: Rogelia Copping, MD;  Location: [facility];  Service: Endoscopy;  Laterality: N/A;   CYSTOSCOPY W/ URETERAL STENT PLACEMENT Left 03/28/2019   Procedure: CYSTOSCOPY WITH RETROGRADE PYELOGRAM/URETERAL STENT PLACEMENT;  Surgeon: Twylla Glendia BROCKS, MD;  Location: [facility];  Service: Urology;  Laterality: Left;   CYSTOSCOPY WITH STENT PLACEMENT Left 04/23/2019   Procedure: CYSTOSCOPY WITH LEFT STENT EXCHANGE;  Surgeon: Twylla Glendia BROCKS, MD;  Location: [facility];  Service: Urology;  Laterality: Left;   IR IMAGING GUIDED PORT INSERTION  04/06/2019   IR REMOVAL TUN ACCESS W/ PORT W/O FL MOD SED  10/16/2019   MASS BIOPSY Left 04/10/2019   Procedure: NECK MASS BIOPSY LEFT;  Surgeon: Jordis Laneta FALCON, MD;  Location: [facility];  Service: General;  Laterality: Left;   POLYPECTOMY  02/25/2017   Procedure: POLYPECTOMY;  Surgeon: Rogelia Copping, MD;  Location: [facility];  Service: Endoscopy;;   PORT A CATH INJECTION ([facility] HX)     ROTATOR CUFF REPAIR Right    TONSILLECTOMY  1972    Social History   Socioeconomic History   Marital status: Married    Spouse name: Not on file   Number of children: Not on file   Years of education: Not on file   Highest education level: Not on file  Occupational History   Not on file  Tobacco Use   Smoking status: Never   Smokeless tobacco: Never  Vaping Use   Vaping status: Never Used  Substance and Sexual Activity   Alcohol use: No   Drug use: No   Sexual activity: Yes  Other Topics Concern   Not on file  Social History Narrative   Not on file   Social Drivers of Health   Financial Resource Strain: Not on file  Food Insecurity: Not on file  Transportation Needs: Not on file  Physical  Activity: Not on file  Stress: Not on file  Social Connections: Not on file  Intimate Partner Violence: Not on file    Family History  Problem Relation Age of Onset   Alcohol abuse Maternal Grandfather    Healthy Mother    Cancer Father    Healthy Sister    Healthy Brother    Breast cancer Maternal Aunt    Hyperlipidemia Neg Hx    Heart disease Neg Hx    Diabetes Neg Hx      Current Outpatient Medications:    acyclovir  (ZOVIRAX ) 400 MG tablet, Take 1 tablet (400 mg total) by mouth daily. (Patient not taking: Reported on 10/13/2019), Disp: 30 tablet, Rfl: 3   albuterol  (VENTOLIN  HFA) 108 (90 Base) MCG/ACT inhaler, Inhale 2 puffs into the lungs every 6 (six) hours as needed., Disp: , Rfl:    Cholecalciferol (VITAMIN D3 PO), Take 20,000 mcg by mouth daily., Disp: , Rfl:    HYDROcodone -acetaminophen  (NORCO) 10-325 MG tablet, Take 1 tablet by mouth every 6 (six) hours as needed. (Patient not taking:  Reported on 09/30/2019), Disp: 90 tablet, Rfl: 0   lidocaine -prilocaine  (EMLA ) cream, Apply to affected area once (Patient not taking: Reported on 10/16/2019), Disp: 30 g, Rfl: 3   Menaquinone-7 (VITAMIN K2  PO), Take 200 mcg by mouth daily., Disp: , Rfl:    Multiple Vitamin (MULTI-VITAMIN DAILY PO), Take 1 tablet by mouth daily.  (Patient not taking: Reported on 07/19/2023), Disp: , Rfl:    Probiotic Product (PROBIOTIC PO), Take 1 capsule by mouth daily. (Patient not taking: Reported on 07/19/2023), Disp: , Rfl:    prochlorperazine  (COMPAZINE ) 10 MG tablet, Take 1 tablet (10 mg total) by mouth every 6 (six) hours as needed (Nausea or vomiting). (Patient not taking: Reported on 09/30/2019), Disp: 30 tablet, Rfl: 1  Physical exam:  Vitals:   07/24/24 1410  BP: 110/76  Pulse: 81  Resp: 19  Temp: 97.8 F (36.6 C)  TempSrc: Tympanic  SpO2: 96%  Weight: 206 lb 11.2 oz (93.8 kg)  Height: 5' 6 (1.676 m)   Physical Exam Cardiovascular:     Rate and Rhythm: Normal rate and regular rhythm.      Heart sounds: Normal heart sounds.  Pulmonary:     Effort: Pulmonary effort is normal.     Breath sounds: Normal breath sounds.  Abdominal:     General: There is no distension.     Tenderness: There is no abdominal tenderness.     Comments: No hepatosplenomegaly  Lymphadenopathy:     Comments: Palpable nonbulky left supraclavicular adenopathy.  No palpable axillary cervical or inguinal adenopathy  Skin:    General: Skin is warm and dry.  Neurological:     Mental Status: She is alert and oriented to person, place, and time.      I have personally reviewed labs listed below:    Latest Ref Rng & Units 07/24/2024    1:53 PM  CMP  Glucose 70 - 99 mg/dL 98   BUN 8 - 23 mg/dL 21   Creatinine 9.55 - 1.00 mg/dL 9.13   Sodium 864 - 854 mmol/L 140   Potassium 3.5 - 5.1 mmol/L 3.6   Chloride 98 - 111 mmol/L 104   CO2 22 - 32 mmol/L 29   Calcium 8.9 - 10.3 mg/dL 8.9   Total Protein 6.5 - 8.1 g/dL 6.0   Total Bilirubin 0.0 - 1.2 mg/dL 0.9   Alkaline Phos 38 - 126 U/L 81   AST 15 - 41 U/L 20   ALT 0 - 44 U/L 18       Latest Ref Rng & Units 01/17/2024   12:41 PM  CBC  WBC 4.0 - 10.5 K/uL 7.5   Hemoglobin 12.0 - 15.0 g/dL 86.1   Hematocrit 63.9 - 46.0 % 42.3   Platelets 150 - 400 K/uL 244     Assessment and plan- Patient is a 68 y.o. female with a history of stage III grade 3A follicular lymphoma s/p 6 cycles of bendamustine  and Rituxan .  She is here for a routine surveillance visit for lymphoma  Clinically patient is doing well with no concerning signs and symptoms of recurrence based on today's exam.  She completed 6 cycles of BR chemotherapy for follicular lymphoma in November 2020 and clinically remains in remission.Her last imaging was from July 2021 and CT abdomen at that time did not show any evidence of recurrent disease.  Will plan to get repeat imaging based on suspicious signs and symptoms.  Given that she is now nearing 5 years from her lymphoma  diagnosis I will see her more  yearly basis   Visit Diagnosis 1. Encounter for follow-up surveillance of lymphoma      Dr. Annah Skene, MD, MPH [facility] [place] at [facility] [phone] 07/24/2024 1:47 PM

## 2024-07-24 NOTE — Progress Notes (Signed)
 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

## 2024-11-13 ENCOUNTER — Encounter: Payer: Self-pay | Admitting: Oncology

## 2024-11-13 DIAGNOSIS — C8233 Follicular lymphoma grade IIIa, intra-abdominal lymph nodes: Secondary | ICD-10-CM

## 2024-11-13 NOTE — Telephone Encounter (Signed)
"  Ok to send the referral  "

## 2024-11-20 ENCOUNTER — Telehealth: Payer: Self-pay | Admitting: *Deleted

## 2024-11-20 ENCOUNTER — Encounter: Payer: Self-pay | Admitting: *Deleted

## 2024-11-20 NOTE — Progress Notes (Signed)
Acupuncture approval faxed to [place]

## 2024-11-20 NOTE — Telephone Encounter (Signed)
 [facility] has patient in the office for her acupunture apt. The chiropractor's office wanted to know if the cancer center funding would be covering patient's acupuncture apt. I reached out to Shasta Ada, RN in navigation to see how this call should be handled. Per Shasta, she can assist by sending the approval over, in general. Nicki handles the referrals for non breast patients. Shasta will send approval to chiropractor's office.

## 2024-11-27 NOTE — Telephone Encounter (Signed)
 Outbound call to [facility], spoke with Abby who confirmed The acupuncture approval for 6 sessions was indeed received by their office on 11/20/24. No further follow up needed.

## 2025-07-23 ENCOUNTER — Other Ambulatory Visit

## 2025-07-23 ENCOUNTER — Ambulatory Visit: Admitting: Oncology
